# Patient Record
Sex: Male | Born: 1945 | Race: White | Hispanic: No | Smoking: Never smoker
Health system: Southern US, Community
[De-identification: ages and names within clinical notes are randomized; demographics above are authoritative.]

## PROBLEM LIST (undated history)

## (undated) DIAGNOSIS — Z9889 Other specified postprocedural states: Secondary | ICD-10-CM

## (undated) DIAGNOSIS — C61 Malignant neoplasm of prostate: Secondary | ICD-10-CM

## (undated) DIAGNOSIS — C649 Malignant neoplasm of unspecified kidney, except renal pelvis: Secondary | ICD-10-CM

## (undated) DIAGNOSIS — I10 Essential (primary) hypertension: Secondary | ICD-10-CM

## (undated) DIAGNOSIS — D649 Anemia, unspecified: Secondary | ICD-10-CM

## (undated) DIAGNOSIS — R112 Nausea with vomiting, unspecified: Secondary | ICD-10-CM

## (undated) DIAGNOSIS — Z9989 Dependence on other enabling machines and devices: Secondary | ICD-10-CM

## (undated) DIAGNOSIS — J302 Other seasonal allergic rhinitis: Secondary | ICD-10-CM

## (undated) DIAGNOSIS — Z7189 Other specified counseling: Secondary | ICD-10-CM

## (undated) DIAGNOSIS — E785 Hyperlipidemia, unspecified: Secondary | ICD-10-CM

## (undated) DIAGNOSIS — C4490 Unspecified malignant neoplasm of skin, unspecified: Secondary | ICD-10-CM

## (undated) DIAGNOSIS — C801 Malignant (primary) neoplasm, unspecified: Secondary | ICD-10-CM

## (undated) DIAGNOSIS — E119 Type 2 diabetes mellitus without complications: Secondary | ICD-10-CM

## (undated) DIAGNOSIS — T8859XA Other complications of anesthesia, initial encounter: Secondary | ICD-10-CM

## (undated) DIAGNOSIS — G4733 Obstructive sleep apnea (adult) (pediatric): Secondary | ICD-10-CM

## (undated) DIAGNOSIS — N183 Chronic kidney disease, stage 3 unspecified: Secondary | ICD-10-CM

## (undated) HISTORY — PX: VASECTOMY: SHX75

## (undated) HISTORY — DX: Malignant neoplasm of prostate: C61

## (undated) HISTORY — DX: Essential (primary) hypertension: I10

## (undated) HISTORY — DX: Dependence on other enabling machines and devices: Z99.89

## (undated) HISTORY — DX: Other specified counseling: Z71.89

## (undated) HISTORY — PX: REPAIR KNEE LIGAMENT: SUR1188

## (undated) HISTORY — DX: Other seasonal allergic rhinitis: J30.2

## (undated) HISTORY — DX: Malignant (primary) neoplasm, unspecified: C80.1

## (undated) HISTORY — PX: OTHER SURGICAL HISTORY: SHX169

## (undated) HISTORY — PX: HERNIA REPAIR: SHX51

## (undated) HISTORY — DX: Unspecified malignant neoplasm of skin, unspecified: C44.90

## (undated) HISTORY — PX: TONSILLECTOMY: SUR1361

## (undated) HISTORY — DX: Obstructive sleep apnea (adult) (pediatric): G47.33

---

## 2015-02-15 DIAGNOSIS — Z85828 Personal history of other malignant neoplasm of skin: Secondary | ICD-10-CM | POA: Insufficient documentation

## 2015-02-18 DIAGNOSIS — R413 Other amnesia: Secondary | ICD-10-CM | POA: Insufficient documentation

## 2015-04-26 DIAGNOSIS — I10 Essential (primary) hypertension: Secondary | ICD-10-CM | POA: Insufficient documentation

## 2015-11-30 DIAGNOSIS — E782 Mixed hyperlipidemia: Secondary | ICD-10-CM | POA: Insufficient documentation

## 2016-06-12 ENCOUNTER — Other Ambulatory Visit: Payer: Self-pay | Admitting: Neurology

## 2016-06-12 DIAGNOSIS — M5416 Radiculopathy, lumbar region: Secondary | ICD-10-CM | POA: Insufficient documentation

## 2016-06-12 DIAGNOSIS — R202 Paresthesia of skin: Secondary | ICD-10-CM

## 2016-06-12 DIAGNOSIS — R200 Anesthesia of skin: Secondary | ICD-10-CM

## 2016-06-13 ENCOUNTER — Ambulatory Visit
Admission: RE | Admit: 2016-06-13 | Discharge: 2016-06-13 | Disposition: A | Discharge status: Home / Self Care | Payer: Medicare Other | Source: Ambulatory Visit | Attending: Neurology | Admitting: Neurology

## 2016-06-13 DIAGNOSIS — R200 Anesthesia of skin: Secondary | ICD-10-CM

## 2016-06-13 DIAGNOSIS — R202 Paresthesia of skin: Secondary | ICD-10-CM | POA: Diagnosis present

## 2016-08-13 DIAGNOSIS — E538 Deficiency of other specified B group vitamins: Secondary | ICD-10-CM | POA: Insufficient documentation

## 2016-08-20 ENCOUNTER — Other Ambulatory Visit: Payer: Self-pay | Admitting: Neurology

## 2016-08-20 DIAGNOSIS — R202 Paresthesia of skin: Secondary | ICD-10-CM

## 2016-08-20 DIAGNOSIS — R200 Anesthesia of skin: Secondary | ICD-10-CM

## 2016-09-17 DIAGNOSIS — G629 Polyneuropathy, unspecified: Secondary | ICD-10-CM | POA: Insufficient documentation

## 2017-06-19 DIAGNOSIS — Z9079 Acquired absence of other genital organ(s): Secondary | ICD-10-CM | POA: Insufficient documentation

## 2017-11-18 ENCOUNTER — Other Ambulatory Visit: Payer: Self-pay | Admitting: Neurology

## 2017-11-18 DIAGNOSIS — M5416 Radiculopathy, lumbar region: Secondary | ICD-10-CM

## 2017-11-25 ENCOUNTER — Ambulatory Visit
Admission: RE | Admit: 2017-11-25 | Discharge: 2017-11-25 | Disposition: A | Discharge status: Home / Self Care | Payer: Medicare Other | Source: Ambulatory Visit | Attending: Neurology | Admitting: Neurology

## 2017-11-25 DIAGNOSIS — M4726 Other spondylosis with radiculopathy, lumbar region: Secondary | ICD-10-CM | POA: Insufficient documentation

## 2017-11-25 DIAGNOSIS — M5416 Radiculopathy, lumbar region: Secondary | ICD-10-CM

## 2019-10-15 DIAGNOSIS — Z9989 Dependence on other enabling machines and devices: Secondary | ICD-10-CM | POA: Insufficient documentation

## 2019-10-15 DIAGNOSIS — G4733 Obstructive sleep apnea (adult) (pediatric): Secondary | ICD-10-CM | POA: Insufficient documentation

## 2019-11-08 ENCOUNTER — Ambulatory Visit: Payer: Medicare Other | Attending: Internal Medicine

## 2019-11-08 DIAGNOSIS — Z20822 Contact with and (suspected) exposure to covid-19: Secondary | ICD-10-CM

## 2019-11-09 LAB — NOVEL CORONAVIRUS, NAA: SARS-CoV-2, NAA: NOT DETECTED

## 2019-11-10 ENCOUNTER — Telehealth: Payer: Self-pay

## 2019-11-10 NOTE — Telephone Encounter (Signed)
Patient giving negative result and verbalized understanding

## 2020-02-16 DIAGNOSIS — E1169 Type 2 diabetes mellitus with other specified complication: Secondary | ICD-10-CM | POA: Insufficient documentation

## 2020-03-03 DIAGNOSIS — M25551 Pain in right hip: Secondary | ICD-10-CM | POA: Insufficient documentation

## 2020-03-03 DIAGNOSIS — M25552 Pain in left hip: Secondary | ICD-10-CM | POA: Insufficient documentation

## 2020-03-03 DIAGNOSIS — Z9889 Other specified postprocedural states: Secondary | ICD-10-CM | POA: Insufficient documentation

## 2020-06-13 DIAGNOSIS — Z66 Do not resuscitate: Secondary | ICD-10-CM | POA: Insufficient documentation

## 2020-06-13 DIAGNOSIS — Z0000 Encounter for general adult medical examination without abnormal findings: Secondary | ICD-10-CM | POA: Insufficient documentation

## 2020-06-20 ENCOUNTER — Other Ambulatory Visit: Payer: Self-pay

## 2020-06-20 ENCOUNTER — Encounter: Payer: Self-pay | Admitting: Urology

## 2020-06-20 ENCOUNTER — Ambulatory Visit (INDEPENDENT_AMBULATORY_CARE_PROVIDER_SITE_OTHER): Payer: Medicare Other | Admitting: Urology

## 2020-06-20 VITALS — BP 128/82 | HR 71 | Ht 69.0 in | Wt 241.0 lb

## 2020-06-20 DIAGNOSIS — C61 Malignant neoplasm of prostate: Secondary | ICD-10-CM | POA: Diagnosis not present

## 2020-06-20 NOTE — Progress Notes (Signed)
° °  06/20/20 10:20 AM   Peter Stuart 1946-04-25 [id number]  CC: History of prostate cancer  HPI: I saw Peter Stuart today to establish care for history of prostate cancer.  He is a 74 year old male who underwent robotic prostatectomy in [place] in 2009 for prostate cancer.  He is unsure what his Gleason score or risk category was, and those records are unavailable to me.  He did not require any adjuvant radiation, and PSA has remained undetectable since that time.  Most recent PSA was undetectable in February 2020.  He has not had erections since surgery, and his wife passed away last year.  He has had chronic stress incontinence since surgery that is minimally bothersome, and he uses 2-3 pads per day.  Social History:  reports that he has never smoked. He has never used smokeless tobacco. He reports current alcohol use. He reports that he does not use drugs.  Physical Exam: BP 128/82    Pulse 71    Ht 5\' 9"  (1.753 m)    Wt 241 lb (109.3 kg)    BMI 35.59 kg/m    Constitutional:  Alert and oriented, No acute distress. Cardiovascular: No clubbing, cyanosis, or edema. Respiratory: Normal respiratory effort, no increased work of breathing. GI: Abdomen is soft, nontender, nondistended, no abdominal masses   Assessment & Plan:   74 year old male with distant history of prostate cancer of unknown Gleason score treated in 2009 in [place] with robotic prostatectomy, with undetectable PSA since that time.  We discussed that guidelines do not recommend routine PSA surveillance after 10 years, and he is amenable to discontinuing PSA surveillance.  We discussed the extremely low risk of recurrence.  Regarding his incontinence, we discussed different options including Kegel exercises, Cunningham clamp, or artificial urinary sphincter.  He is minimally bothered at this time and is not interested in pursuing other treatment strategies.  Follow-up as needed  I spent 45 total minutes on the day of the  encounter including pre-visit review of the medical record, face-to-face time with the patient, and post visit ordering of labs/imaging/tests.  [name], MD 06/20/2020  [facility] [address], [place], [place] [address] [phone]

## 2020-08-11 ENCOUNTER — Ambulatory Visit: Payer: Medicare Other

## 2020-08-14 ENCOUNTER — Ambulatory Visit (INDEPENDENT_AMBULATORY_CARE_PROVIDER_SITE_OTHER): Payer: Medicare Other | Admitting: Internal Medicine

## 2020-08-14 DIAGNOSIS — G4733 Obstructive sleep apnea (adult) (pediatric): Secondary | ICD-10-CM | POA: Diagnosis not present

## 2020-08-14 DIAGNOSIS — I10 Essential (primary) hypertension: Secondary | ICD-10-CM | POA: Diagnosis not present

## 2020-08-14 DIAGNOSIS — Z6834 Body mass index (BMI) 34.0-34.9, adult: Secondary | ICD-10-CM

## 2020-08-14 DIAGNOSIS — Z7189 Other specified counseling: Secondary | ICD-10-CM

## 2020-08-14 DIAGNOSIS — Z9989 Dependence on other enabling machines and devices: Secondary | ICD-10-CM

## 2020-08-14 NOTE — Progress Notes (Signed)
[facility] [address], [place] [address]  Pulmonary Sleep Medicine   Office Visit Note  Patient Name: Peter Stuart DOB: 1946-06-07 MRN [id number]    Chief Complaint: Obstructive Sleep Apnea visit  Brief History:  Peter Stuart is seen today for initial consultation The patient has a 11 year history of sleep apnea. Patient is using PAP nightly.  The patient feels more rested after sleeping with PAP.  The patient reports benefiting from PAP use. He goes to bed around midnight and wakes 7-7:30 a.m. Reported sleepiness is  improved and the Epworth Sleepiness Score is 9 out of 24. The patient does not take naps but may nod in the afternoons. The patient complains of the following: mask issues and dry mouth.  The compliance download shows excellent  compliance with an average use time of 6.9 hours. The AHI is 2.3  The patient does not complain of limb movements disrupting sleep.   ROS  General: (-) fever, (-) chills, (-) night sweat Nose and Sinuses: (-) nasal stuffiness or itchiness, (-) postnasal drip, (-) nosebleeds, (-) sinus trouble. Mouth and Throat: (-) sore throat, (-) hoarseness. Neck: (-) swollen glands, (-) enlarged thyroid, (-) neck pain. Respiratory: - cough, - shortness of breath, - wheezing. Neurologic: - numbness, - tingling. Psychiatric: - anxiety, - depression   Current Medication: Outpatient Encounter Medications as of 08/14/2020  Medication Sig  . ALLEGRA ALLERGY 60 MG tablet Take 1 tablet by mouth daily as needed.  . Coenzyme Q10 10 MG capsule Take 1 capsule by mouth daily.  Marland Kitchen ezetimibe (ZETIA) 10 MG tablet Take 10 mg by mouth daily.  . fluticasone (FLONASE) 50 MCG/ACT nasal spray Place 2 sprays into the nose daily as needed.  . gabapentin (NEURONTIN) 400 MG capsule Take 400 mg by mouth at bedtime.  . hydrochlorothiazide (HYDRODIURIL) 25 MG tablet Take 25 mg by mouth daily.  Marland Kitchen losartan (COZAAR) 100 MG tablet Take 100 mg by mouth daily.  .  Multiple Vitamin (MULTIVITAMIN) capsule Take 1 capsule by mouth daily.  . Multiple Vitamins-Minerals (OCUVITE EYE HEATLH GUMMIES) CHEW Chew 1 tablet by mouth daily.  . [DISCONTINUED] metFORMIN (GLUCOPHAGE) 500 MG tablet Take 500 mg by mouth daily.   No facility-administered encounter medications on file as of 08/14/2020.    Surgical History: Past Surgical History:  Procedure Laterality Date  . HERNIA REPAIR    . prostatectomy    . REPAIR KNEE LIGAMENT    . TONSILLECTOMY    . VASECTOMY      Medical History: Past Medical History:  Diagnosis Date  . CPAP use counseling   . HTN (hypertension)   . OSA on CPAP   . Seasonal allergies     Family History: Non contributory to the present illness  Social History: Social History   Socioeconomic History  . Marital status: Married    Spouse name: Not on file  . Number of children: Not on file  . Years of education: Not on file  . Highest education level: Not on file  Occupational History  . Not on file  Tobacco Use  . Smoking status: Never Smoker  . Smokeless tobacco: Never Used  Substance and Sexual Activity  . Alcohol use: Yes  . Drug use: Never  . Sexual activity: Not Currently  Other Topics Concern  . Not on file  Social History Narrative  . Not on file   Social Determinants of Health   Financial Resource Strain:   . Difficulty of Paying Living Expenses:  Not on file  Food Insecurity:   . Worried About Charity fundraiser in the Last Year: Not on file  . Ran Out of Food in the Last Year: Not on file  Transportation Needs:   . Lack of Transportation (Medical): Not on file  . Lack of Transportation (Non-Medical): Not on file  Physical Activity:   . Days of Exercise per Week: Not on file  . Minutes of Exercise per Session: Not on file  Stress:   . Feeling of Stress : Not on file  Social Connections:   . Frequency of Communication with Friends and Family: Not on file  . Frequency of Social Gatherings with Friends  and Family: Not on file  . Attends Religious Services: Not on file  . Active Member of Clubs or Organizations: Not on file  . Attends Archivist Meetings: Not on file  . Marital Status: Not on file  Intimate Partner Violence:   . Fear of Current or Ex-Partner: Not on file  . Emotionally Abused: Not on file  . Physically Abused: Not on file  . Sexually Abused: Not on file    Vital Signs: Blood pressure 119/84, pulse 65, height 5\' 10"  (1.778 m), weight 242 lb (109.8 kg), SpO2 94 %.  Examination: General Appearance: The patient is well-developed, well-nourished, and in no distress. Neck Circumference: 46 Skin: Gross inspection of skin unremarkable. Head: normocephalic, no gross deformities. Eyes: no gross deformities noted. ENT: ears appear grossly normal Neurologic: Alert and oriented. No involuntary movements.    EPWORTH SLEEPINESS SCALE:  Scale:  (0)= no chance of dozing; (1)= slight chance of dozing; (2)= moderate chance of dozing; (3)= high chance of dozing  Chance  Situtation    Sitting and reading: 2    Watching TV: 2    Sitting Inactive in public: 1    As a passenger in car: 0      Lying down to rest: 3    Sitting and talking: 0    Sitting quielty after lunch: 1    In a car, stopped in traffic: 0   TOTAL SCORE:   9 out of 24    SLEEP STUDIES:  1. Split 02/09/09 AHI 42 SpO46min 75%, CPAP 10 cm H2O   CPAP COMPLIANCE DATA:  Date Range: 07/12/20-08/10/20  Average Daily Use: 6.9 hours  Median Use: 6.9  Compliance for > 4 Hours: 100% days  AHI: 2.3 respiratory events per hour  Days Used: 30/30  Mask Leak: 48.1  95th Percentile Pressure: 5-15         LABS: No results found for this or any previous visit (from the past 2160 hour(s)).  Radiology: MR LUMBAR SPINE WO CONTRAST  Result Date: 11/25/2017 CLINICAL DATA:  No known injury, numbness and tingling in both feet EXAM: MRI LUMBAR SPINE WITHOUT CONTRAST TECHNIQUE:  Multiplanar, multisequence MR imaging of the lumbar spine was performed. No intravenous contrast was administered. COMPARISON:  None. FINDINGS: Segmentation:  Standard. Alignment:  Physiologic. Vertebrae:  No fracture, evidence of discitis, or bone lesion. Conus medullaris and cauda equina: Conus extends to the T12 level. Conus and cauda equina appear normal. Paraspinal and other soft tissues: No paraspinal abnormality. Disc levels: Disc spaces: Degenerative disease with disc height loss at L5-S1. T12-L1: No significant disc bulge. No evidence of neural foraminal stenosis. No central canal stenosis. L1-L2: No significant disc bulge. No evidence of neural foraminal stenosis. No central canal stenosis. L2-L3: No significant disc bulge. No evidence of neural foraminal  stenosis. No central canal stenosis. L3-L4: Mild broad-based disc bulge. Moderate bilateral facet arthropathy. No evidence of neural foraminal stenosis. No central canal stenosis. L4-L5: Broad-based disc bulge. Moderate bilateral facet arthropathy, right worse than left. No evidence of neural foraminal stenosis. No central canal stenosis. Bilateral lateral recess narrowing. L5-S1: Broad-based disc bulge. No evidence of neural foraminal stenosis. No central canal stenosis. IMPRESSION: 1. Mild lumbar spine spondylosis as described above. Electronically Signed   By: Kathreen Devoid   On: 11/25/2017 15:11          Assessment and Plan: Patient Active Problem List   Diagnosis Date Noted  . Obesity, morbid ([place]) 02/14/2020  . OSA on CPAP 10/15/2019  . Essential hypertension 04/26/2015      The patient does tolerate PAP and reports significant benefit from PAP use. The patient was reminded how to clean the machine and adjust the humidifier setting. He was advised to try xylimelts and or a chin strap for the dry mouth. The patient was also counselled on the importance of weight loss in treating the sleep apnea. He is watching his diet. The  compliance is excellent. The apnea is well controlled.   1. OSA- continue excellent compliance. Ensure adequate sleep time. 2. CPAP couseling-Discussed importance of adequate CPAP use as well as proper care and cleaning techniques of machine and all supplies. 3. HTN:BP well controlled today, continue with current therapy and PCP follow-up as indicated 4. BMI 34: Praised for recent weight loss around 30 pounds, encouraged to continue with healthy lifestyle and keep weight management a priority  General Counseling: I have discussed the findings of the evaluation and examination with Marcello Moores.  I have also discussed any further diagnostic evaluation thatmay be needed or ordered today. Senay verbalizes understanding of the findings of todays visit. We also reviewed his medications today and discussed drug interactions and side effects including but not limited excessive drowsiness and altered mental states. We also discussed that there is always a risk not just to him but also people around him. he has been encouraged to call the office with any questions or concerns that should arise related to todays visit.   I have personally obtained a history, examined the patient, evaluated laboratory and imaging results, formulated the assessment and plan and placed orders.  This patient was seen by Theodoro Grist AGNP-C in Collaboration with Dr. Devona Konig as a part of collaborative care agreement.  Richelle Ito Saunders Glance, PhD, FAASM  Diplomate, American Board of Sleep Medicine    Allyne Gee, MD [facility] Diplomate ABMS Pulmonary and Critical Care Medicine Sleep medicine

## 2020-08-14 NOTE — Patient Instructions (Signed)

## 2020-09-28 ENCOUNTER — Other Ambulatory Visit: Payer: Self-pay | Admitting: Orthopedic Surgery

## 2020-09-28 DIAGNOSIS — M25511 Pain in right shoulder: Secondary | ICD-10-CM

## 2020-09-28 DIAGNOSIS — M25311 Other instability, right shoulder: Secondary | ICD-10-CM

## 2020-09-28 DIAGNOSIS — M19011 Primary osteoarthritis, right shoulder: Secondary | ICD-10-CM

## 2020-10-05 ENCOUNTER — Ambulatory Visit
Admission: RE | Admit: 2020-10-05 | Discharge: 2020-10-05 | Disposition: A | Discharge status: Home / Self Care | Payer: Medicare Other | Source: Ambulatory Visit | Attending: Orthopedic Surgery | Admitting: Orthopedic Surgery

## 2020-10-05 ENCOUNTER — Other Ambulatory Visit: Payer: Self-pay

## 2020-10-05 DIAGNOSIS — M25311 Other instability, right shoulder: Secondary | ICD-10-CM | POA: Insufficient documentation

## 2020-10-05 DIAGNOSIS — M25511 Pain in right shoulder: Secondary | ICD-10-CM | POA: Insufficient documentation

## 2020-10-05 DIAGNOSIS — M19011 Primary osteoarthritis, right shoulder: Secondary | ICD-10-CM | POA: Diagnosis present

## 2020-12-12 ENCOUNTER — Other Ambulatory Visit: Payer: Self-pay | Admitting: Orthopedic Surgery

## 2020-12-25 ENCOUNTER — Encounter: Payer: Self-pay | Admitting: Orthopedic Surgery

## 2020-12-25 ENCOUNTER — Other Ambulatory Visit: Payer: Self-pay

## 2020-12-27 ENCOUNTER — Other Ambulatory Visit: Payer: Self-pay

## 2020-12-27 ENCOUNTER — Other Ambulatory Visit
Admission: RE | Admit: 2020-12-27 | Discharge: 2020-12-27 | Disposition: A | Discharge status: Home / Self Care | Payer: Medicare Other | Source: Ambulatory Visit | Attending: Orthopedic Surgery | Admitting: Orthopedic Surgery

## 2020-12-27 DIAGNOSIS — Z20822 Contact with and (suspected) exposure to covid-19: Secondary | ICD-10-CM | POA: Insufficient documentation

## 2020-12-27 DIAGNOSIS — Z01812 Encounter for preprocedural laboratory examination: Secondary | ICD-10-CM | POA: Insufficient documentation

## 2020-12-27 LAB — SARS CORONAVIRUS 2 (TAT 6-24 HRS): SARS Coronavirus 2: NEGATIVE

## 2020-12-29 ENCOUNTER — Ambulatory Visit: Payer: Medicare Other | Admitting: Anesthesiology

## 2020-12-29 ENCOUNTER — Other Ambulatory Visit: Payer: Self-pay

## 2020-12-29 ENCOUNTER — Ambulatory Visit
Admission: RE | Admit: 2020-12-29 | Discharge: 2020-12-29 | Disposition: A | Discharge status: Home / Self Care | Payer: Medicare Other | Attending: Orthopedic Surgery | Admitting: Orthopedic Surgery

## 2020-12-29 ENCOUNTER — Encounter: Payer: Self-pay | Admitting: Orthopedic Surgery

## 2020-12-29 ENCOUNTER — Encounter
Admission: RE | Disposition: A | Discharge status: Home / Self Care | Payer: Self-pay | Source: Home / Self Care | Attending: Orthopedic Surgery

## 2020-12-29 DIAGNOSIS — M75121 Complete rotator cuff tear or rupture of right shoulder, not specified as traumatic: Secondary | ICD-10-CM | POA: Diagnosis not present

## 2020-12-29 DIAGNOSIS — Z888 Allergy status to other drugs, medicaments and biological substances status: Secondary | ICD-10-CM | POA: Diagnosis not present

## 2020-12-29 DIAGNOSIS — M659 Synovitis and tenosynovitis, unspecified: Secondary | ICD-10-CM | POA: Insufficient documentation

## 2020-12-29 DIAGNOSIS — M25811 Other specified joint disorders, right shoulder: Secondary | ICD-10-CM | POA: Insufficient documentation

## 2020-12-29 DIAGNOSIS — K029 Dental caries, unspecified: Secondary | ICD-10-CM

## 2020-12-29 DIAGNOSIS — Z79899 Other long term (current) drug therapy: Secondary | ICD-10-CM | POA: Insufficient documentation

## 2020-12-29 HISTORY — DX: Other complications of anesthesia, initial encounter: T88.59XA

## 2020-12-29 HISTORY — PX: SHOULDER ARTHROSCOPY WITH SUBACROMIAL DECOMPRESSION AND OPEN ROTATOR C: SHX5688

## 2020-12-29 SURGERY — SHOULDER ARTHROSCOPY WITH SUBACROMIAL DECOMPRESSION AND OPEN ROTATOR CUFF REPAIR, OPEN BICEPS TENDON REPAIR
Anesthesia: Regional | Site: Shoulder | Laterality: Right

## 2020-12-29 MED ORDER — BUPIVACAINE LIPOSOME 1.3 % IJ SUSP: INTRAMUSCULAR | Status: DC | PRN

## 2020-12-29 MED ORDER — OXYCODONE HCL 5 MG PO TABS
5.0000 mg | ORAL_TABLET | ORAL | 0 refills | Status: DC | PRN
Start: 2020-12-29 — End: 2021-04-27

## 2020-12-29 MED ORDER — MIDAZOLAM HCL 5 MG/5ML IJ SOLN
INTRAMUSCULAR | Status: DC | PRN
  Administered 2020-12-29: 1 mg via INTRAVENOUS

## 2020-12-29 MED ORDER — FENTANYL CITRATE (PF) 100 MCG/2ML IJ SOLN
INTRAMUSCULAR | Status: DC | PRN
  Administered 2020-12-29: 50 ug via INTRAVENOUS

## 2020-12-29 MED ORDER — ONDANSETRON 4 MG PO TBDP: 4.0000 mg | ORAL_TABLET | Freq: Three times a day (TID) | ORAL | 0 refills | Status: DC | PRN

## 2020-12-29 MED ORDER — BUPIVACAINE HCL (PF) 0.5 % IJ SOLN: INTRAMUSCULAR | Status: DC | PRN

## 2020-12-29 MED ORDER — OXYCODONE HCL 5 MG PO TABS: 5.0000 mg | ORAL_TABLET | Freq: Once | ORAL | Status: DC | PRN

## 2020-12-29 MED ORDER — BUPIVACAINE HCL (PF) 0.5 % IJ SOLN
INTRAMUSCULAR | Status: DC | PRN
  Administered 2020-12-29: 20 mL via PERINEURAL

## 2020-12-29 MED ORDER — OXYCODONE HCL 5 MG/5ML PO SOLN: 5.0000 mg | Freq: Once | ORAL | Status: DC | PRN

## 2020-12-29 MED ORDER — PROPOFOL 10 MG/ML IV BOLUS
INTRAVENOUS | Status: DC | PRN
  Administered 2020-12-29: 30 mg via INTRAVENOUS
  Administered 2020-12-29: 120 mg via INTRAVENOUS

## 2020-12-29 MED ORDER — LIDOCAINE HCL (CARDIAC) PF 100 MG/5ML IV SOSY
PREFILLED_SYRINGE | INTRAVENOUS | Status: DC | PRN
  Administered 2020-12-29: 80 mg via INTRATRACHEAL

## 2020-12-29 MED ORDER — ASPIRIN EC 325 MG PO TBEC: 325.0000 mg | DELAYED_RELEASE_TABLET | Freq: Every day | ORAL | 0 refills | Status: AC

## 2020-12-29 MED ORDER — LACTATED RINGERS IV SOLN: INTRAVENOUS | Status: DC

## 2020-12-29 MED ORDER — FENTANYL CITRATE (PF) 100 MCG/2ML IJ SOLN
25.0000 ug | INTRAMUSCULAR | Status: DC | PRN
Start: 2020-12-29 — End: 2020-12-29

## 2020-12-29 MED ORDER — GLYCOPYRROLATE 0.2 MG/ML IJ SOLN
INTRAMUSCULAR | Status: DC | PRN
  Administered 2020-12-29: .1 mg via INTRAVENOUS

## 2020-12-29 MED ORDER — ONDANSETRON HCL 4 MG/2ML IJ SOLN
INTRAMUSCULAR | Status: DC | PRN
Start: 2020-12-29 — End: 2020-12-29
  Administered 2020-12-29: 4 mg via INTRAVENOUS

## 2020-12-29 MED ORDER — LACTATED RINGERS IV SOLN
INTRAVENOUS | Status: DC | PRN
  Administered 2020-12-29: 4 mL

## 2020-12-29 MED ORDER — ACETAMINOPHEN 160 MG/5ML PO SOLN: 325.0000 mg | ORAL | Status: DC | PRN

## 2020-12-29 MED ORDER — CEFAZOLIN SODIUM-DEXTROSE 2-4 GM/100ML-% IV SOLN
2.0000 g | INTRAVENOUS | Status: AC
  Administered 2020-12-29: 2 g via INTRAVENOUS

## 2020-12-29 MED ORDER — ACETAMINOPHEN 325 MG PO TABS: 325.0000 mg | ORAL_TABLET | ORAL | Status: DC | PRN

## 2020-12-29 MED ORDER — ACETAMINOPHEN 500 MG PO TABS: 1000.0000 mg | ORAL_TABLET | Freq: Three times a day (TID) | ORAL | 2 refills | Status: DC

## 2020-12-29 MED ORDER — BUPIVACAINE LIPOSOME 1.3 % IJ SUSP
INTRAMUSCULAR | Status: DC | PRN
  Administered 2020-12-29: 20 mL via PERINEURAL

## 2020-12-29 SURGICAL SUPPLY — 48 items
ADAPTER IRRIG TUBE 2 SPIKE SOL (ADAPTER) ×4 IMPLANT
ADPR TBG 2 SPK PMP STRL ASCP (ADAPTER) ×2
ANCH SUT 2 SWLK 19.1 CLS EYLT (Anchor) ×2 IMPLANT
ANCH SUT 2.9 PUSHLOCK ANCH (Orthopedic Implant) ×1 IMPLANT
ANCHOR ICONIX SPEED 2.3 (Anchor) ×4 IMPLANT
ANCHOR SWIVELOCK BIO 4.75X19.1 (Anchor) ×4 IMPLANT
APL PRP STRL LF DISP 70% ISPRP (MISCELLANEOUS) ×1
BUR BR 5.5 12 FLUTE (BURR) ×2 IMPLANT
BUR RADIUS 4.0X18.5 (BURR) ×2 IMPLANT
CANNULA TWIST IN 8.25X7CM (CANNULA) ×2 IMPLANT
CHLORAPREP W/TINT 26 (MISCELLANEOUS) ×2 IMPLANT
COOLER POLAR GLACIER W/PUMP (MISCELLANEOUS) ×2 IMPLANT
COVER LIGHT HANDLE UNIVERSAL (MISCELLANEOUS) ×4 IMPLANT
DRAPE IMP U-DRAPE 54X76 (DRAPES) ×4 IMPLANT
DRAPE INCISE IOBAN 66X45 STRL (DRAPES) ×2 IMPLANT
DRAPE U-SHAPE 48X52 POLY STRL (PACKS) ×2 IMPLANT
DRSG TEGADERM 4X4.75 (GAUZE/BANDAGES/DRESSINGS) ×8 IMPLANT
ELECT REM PT RETURN 9FT ADLT (ELECTROSURGICAL) ×2
ELECTRODE REM PT RTRN 9FT ADLT (ELECTROSURGICAL) ×1 IMPLANT
GAUZE XEROFORM 1X8 LF (GAUZE/BANDAGES/DRESSINGS) ×2 IMPLANT
GLOVE SURG ENC MOIS LTX SZ7.5 (GLOVE) ×4 IMPLANT
GOWN STRL REIN 2XL XLG LVL4 (GOWN DISPOSABLE) ×2 IMPLANT
GOWN STRL REUS W/ TWL LRG LVL3 (GOWN DISPOSABLE) ×2 IMPLANT
GOWN STRL REUS W/TWL LRG LVL3 (GOWN DISPOSABLE) ×4
IV LACTATED RINGER IRRG 3000ML (IV SOLUTION) ×18
IV LR IRRIG 3000ML ARTHROMATIC (IV SOLUTION) ×9 IMPLANT
KIT STABILIZATION SHOULDER (MISCELLANEOUS) ×2 IMPLANT
KIT TURNOVER KIT A (KITS) ×2 IMPLANT
MANIFOLD NEPTUNE II (INSTRUMENTS) ×2 IMPLANT
MASK FACE SPIDER DISP (MASK) ×2 IMPLANT
MAT GRAY ABSORB FLUID 28X50 (MISCELLANEOUS) ×4 IMPLANT
NDL SAFETY ECLIPSE 18X1.5 (NEEDLE) ×1 IMPLANT
NEEDLE HYPO 18GX1.5 SHARP (NEEDLE) ×2
PACK ARTHROSCOPY SHOULDER (MISCELLANEOUS) ×2 IMPLANT
PAD WRAPON POLAR SHDR XLG (MISCELLANEOUS) ×1 IMPLANT
PASSER SUT FIRSTPASS SELF (INSTRUMENTS) ×2 IMPLANT
SET TUBE SUCT SHAVER OUTFL 24K (TUBING) ×2 IMPLANT
SLING ULTRA II LG (MISCELLANEOUS) ×2 IMPLANT
SLING ULTRA II M (MISCELLANEOUS) ×2 IMPLANT
SPONGE GAUZE 2X2 8PLY STRL LF (GAUZE/BANDAGES/DRESSINGS) ×8 IMPLANT
SUT ETHILON 3-0 FS-10 30 BLK (SUTURE) ×2
SUTURE EHLN 3-0 FS-10 30 BLK (SUTURE) ×1 IMPLANT
SYR 10ML LL (SYRINGE) ×2 IMPLANT
SYSTEM IMPL TENODESIS LNT 2.9 (Orthopedic Implant) ×2 IMPLANT
TAPE MICROFOAM 4IN (TAPE) ×2 IMPLANT
TUBING ARTHRO INFLOW-ONLY STRL (TUBING) ×2 IMPLANT
WAND WEREWOLF FLOW 90D (MISCELLANEOUS) ×2 IMPLANT
WRAPON POLAR PAD SHDR XLG (MISCELLANEOUS) ×2

## 2020-12-29 NOTE — Transfer of Care (Signed)
Immediate Anesthesia Transfer of Care Note  Patient: Peter Stuart  Procedure(s) Performed: Right shoulder arthroscopic rotator cuff repair, subacromial decompression, and biceps tenodesis (Right Shoulder)  Patient Location: PACU  Anesthesia Type: General, Regional  Level of Consciousness: awake, alert  and patient cooperative  Airway and Oxygen Therapy: Patient Spontanous Breathing and Patient connected to supplemental oxygen  Post-op Assessment: Post-op Vital signs reviewed, Patient's Cardiovascular Status Stable, Respiratory Function Stable, Patent Airway and No signs of Nausea or vomiting  Post-op Vital Signs: Reviewed and stable  Complications: No complications documented.

## 2020-12-29 NOTE — Anesthesia Procedure Notes (Addendum)
Anesthesia Regional Block: Interscalene brachial plexus block   Pre-Anesthetic Checklist: ,, timeout performed, Correct Patient, Correct Site, Correct Laterality, Correct Procedure, Correct Position, site marked, Risks and benefits discussed,  Surgical consent,  Pre-op evaluation,  At surgeon's request and post-op pain management  Laterality: Right  Prep: chloraprep       Needles:  Injection technique: Single-shot  Needle Type: Stimiplex     Needle Length: 10cm  Needle Gauge: 21     Additional Needles:   Procedures:,,,, ultrasound used (permanent image in chart),,,,  Narrative:  Start time: 12/29/2020 9:12 AM End time: 12/29/2020 9:19 AM Injection made incrementally with aspirations every 5 mL.  Performed by: Personally   Additional Notes: Functioning IV was confirmed and monitors applied. Ultrasound guidance: relevant anatomy identified, needle position confirmed, local anesthetic spread visualized around nerve(s)., vascular puncture avoided.  Image printed for medical record.  Negative aspiration and no paresthesias; incremental administration of local anesthetic. The patient tolerated the procedure well. Vitals signes recorded in RN notes.

## 2020-12-29 NOTE — Anesthesia Postprocedure Evaluation (Signed)
Anesthesia Post Note  Patient: Peter Stuart  Procedure(s) Performed: Right shoulder arthroscopic rotator cuff repair, subacromial decompression, and biceps tenodesis (Right Shoulder)     Patient location during evaluation: PACU Anesthesia Type: Regional and General Level of consciousness: awake Pain management: pain level controlled Vital Signs Assessment: post-procedure vital signs reviewed and stable Respiratory status: respiratory function stable Cardiovascular status: stable Postop Assessment: no signs of nausea or vomiting Anesthetic complications: no   No complications documented.  Veda Canning

## 2020-12-29 NOTE — Anesthesia Procedure Notes (Signed)
Procedure Name: LMA Insertion Date/Time: 12/29/2020 10:40 AM Performed by: Silvana Newness, CRNA Pre-anesthesia Checklist: Patient identified, Emergency Drugs available, Suction available, Patient being monitored and Timeout performed Patient Re-evaluated:Patient Re-evaluated prior to induction Oxygen Delivery Method: Circle system utilized Preoxygenation: Pre-oxygenation with 100% oxygen Induction Type: IV induction Ventilation: Mask ventilation without difficulty LMA: LMA inserted LMA Size: 4.0 Tube type: Oral Number of attempts: 1 Placement Confirmation: positive ETCO2 and breath sounds checked- equal and bilateral Tube secured with: Tape Dental Injury: Teeth and Oropharynx as per pre-operative assessment

## 2020-12-29 NOTE — Discharge Instructions (Signed)
Post-Op Instructions - Rotator Cuff Repair  1. Bracing: You will wear a shoulder immobilizer or sling for 6 weeks.   2. Driving: No driving for 3 weeks post-op. When driving, do not wear the immobilizer. Ideally, we recommend no driving for 6 weeks while sling is in place as one arm will be immobilized.   3. Activity: No active lifting for 2 months. Wrist, hand, and elbow motion only. Avoid lifting the upper arm away from the body except for hygiene. You are permitted to bend and straighten the elbow passively only (no active elbow motion). You may use your hand and wrist for typing, writing, and managing utensils (cutting food). Do not lift more than a coffee cup for 8 weeks.  When sleeping or resting, inclined positions (recliner chair or wedge pillow) and a pillow under the forearm for support may provide better comfort for up to 4 weeks.  Avoid long distance travel for 4 weeks.  Return to normal activities after rotator cuff repair repair normally takes 6 months on average. If rehab goes very well, may be able to do most activities at 4 months, except overhead or contact sports.  4. Physical Therapy: Begins 3-4 days after surgery, and proceed 1 time per week for the first 6 weeks, then 1-2 times per week from weeks 6-20 post-op.  5. Medications:  - You will be provided a prescription for narcotic pain medicine. After surgery, take 1-2 narcotic tablets every 4 hours if needed for severe pain.  - A prescription for anti-nausea medication will be provided in case the narcotic medicine causes nausea - take 1 tablet every 6 hours only if nauseated.   - Take tylenol 1000 mg (2 Extra Strength tablets or 3 regular strength) every 8 hours for pain.  May decrease or stop tylenol 5 days after surgery if you are having minimal pain. - Take ASA 325mg/day x 2 weeks to help prevent DVTs/PEs (blood clots).  - DO NOT take ANY nonsteroidal anti-inflammatory pain medications (Advil, Motrin, Ibuprofen, Aleve,  Naproxen, or Naprosyn). These medicines can inhibit healing of your shoulder repair.    If you are taking prescription medication for anxiety, depression, insomnia, muscle spasm, chronic pain, or for attention deficit disorder, you are advised that you are at a higher risk of adverse effects with use of narcotics post-op, including narcotic addiction/dependence, depressed breathing, death. If you use non-prescribed substances: alcohol, marijuana, cocaine, heroin, methamphetamines, etc., you are at a higher risk of adverse effects with use of narcotics post-op, including narcotic addiction/dependence, depressed breathing, death. You are advised that taking > 50 morphine milligram equivalents (MME) of narcotic pain medication per day results in twice the risk of overdose or death. For your prescription provided: oxycodone 5 mg - taking more than 6 tablets per day would result in > 50 morphine milligram equivalents (MME) of narcotic pain medication. Be advised that we will prescribe narcotics short-term, for acute post-operative pain only - 3 weeks for major operations such as shoulder repair/reconstruction surgeries.     6. Post-Op Appointment:  Your first post-op appointment will be 10-14 days post-op.  7. Work or School: For most, but not all procedures, we advise staying out of work or school for at least 1 to 2 weeks in order to recover from the stress of surgery and to allow time for healing.   If you need a work or school note this can be provided.   8. Smoking: If you are a smoker, you need to refrain from   smoking in the postoperative period. The nicotine in cigarettes will inhibit healing of your shoulder repair and decrease the chance of successful repair. Similarly, nicotine containing products (gum, patches) should be avoided.   Post-operative Brace: Apply and remove the brace you received as you were instructed to at the time of fitting and as described in detail as the brace's  instructions for use indicate.  Wear the brace for the period of time prescribed by your physician.  The brace can be cleaned with soap and water and allowed to air dry only.  Should the brace result in increased pain, decreased feeling (numbness/tingling), increased swelling or an overall worsening of your medical condition, please contact your doctor immediately.  If an emergency situation occurs as a result of wearing the brace after normal business hours, please dial 911 and seek immediate medical attention.  Let your doctor know if you have any further questions about the brace issued to you. Refer to the shoulder sling instructions for use if you have any questions regarding the correct fit of your shoulder sling.  [employer] for Troubleshooting: [phone]  Video that illustrates how to properly use a shoulder sling: "Instructions for Proper Use of an Orthopaedic Sling" [email or web address]         General Anesthesia, Adult, Care After This sheet gives you information about how to care for yourself after your procedure. Your health care provider may also give you more specific instructions. If you have problems or questions, contact your health care provider. What can I expect after the procedure? After the procedure, the following side effects are common:  Pain or discomfort at the IV site.  Nausea.  Vomiting.  Sore throat.  Trouble concentrating.  Feeling cold or chills.  Feeling weak or tired.  Sleepiness and fatigue.  Soreness and body aches. These side effects can affect parts of the body that were not involved in surgery. Follow these instructions at home: For the time period you were told by your health care provider:  Rest.  Do not participate in activities where you could fall or become injured.  Do not drive or use machinery.  Do not drink alcohol.  Do not take sleeping pills or medicines that cause drowsiness.  Do  not make important decisions or sign legal documents.  Do not take care of children on your own.   Eating and drinking  Follow any instructions from your health care provider about eating or drinking restrictions.  When you feel hungry, start by eating small amounts of foods that are soft and easy to digest (bland), such as toast. Gradually return to your regular diet.  Drink enough fluid to keep your urine pale yellow.  If you vomit, rehydrate by drinking water, juice, or clear broth. General instructions  If you have sleep apnea, surgery and certain medicines can increase your risk for breathing problems. Follow instructions from your health care provider about wearing your sleep device: ? Anytime you are sleeping, including during daytime naps. ? While taking prescription pain medicines, sleeping medicines, or medicines that make you drowsy.  Have a responsible adult stay with you for the time you are told. It is important to have someone help care for you until you are awake and alert.  Return to your normal activities as told by your health care provider. Ask your health care provider what activities are safe for you.  Take over-the-counter and prescription medicines only as told by your health care provider.  If you smoke,  do not smoke without supervision.  Keep all follow-up visits as told by your health care provider. This is important. Contact a health care provider if:  You have nausea or vomiting that does not get better with medicine.  You cannot eat or drink without vomiting.  You have pain that does not get better with medicine.  You are unable to pass urine.  You develop a skin rash.  You have a fever.  You have redness around your IV site that gets worse. Get help right away if:  You have difficulty breathing.  You have chest pain.  You have blood in your urine or stool, or you vomit blood. Summary  After the procedure, it is common to have a sore  throat or nausea. It is also common to feel tired.  Have a responsible adult stay with you for the time you are told. It is important to have someone help care for you until you are awake and alert.  When you feel hungry, start by eating small amounts of foods that are soft and easy to digest (bland), such as toast. Gradually return to your regular diet.  Drink enough fluid to keep your urine pale yellow.  Return to your normal activities as told by your health care provider. Ask your health care provider what activities are safe for you. This information is not intended to replace advice given to you by your health care provider. Make sure you discuss any questions you have with your health care provider. Document Revised: 06/29/2020 Document Reviewed: 01/27/2020 Elsevier Patient Education  2021 [employer].

## 2020-12-29 NOTE — Op Note (Signed)
SURGERY DATE: 12/29/2020   PRE-OP DIAGNOSIS:  1. Right subacromial impingement 2. Right biceps tendinopathy 3. Right rotator cuff tear   POST-OP DIAGNOSIS: 1. Right subacromial impingement 2. Right biceps tendinopathy 3. Right rotator cuff tear   PROCEDURES:  1. Right arthroscopic rotator cuff repair 2. Right arthroscopic biceps tenodesis 3. Right arthroscopic subacromial decompression 4. Right arthroscopic extensive debridement of shoulder (glenohumeral and subacromial spaces)   SURGEON: Cato Mulligan, MD   ASSISTANT: Anitra Lauth, PA   ANESTHESIA: Gen with Exparil interscalene block   ESTIMATED BLOOD LOSS: 5cc   DRAINS:  none   TOTAL IV FLUIDS: per anesthesia      SPECIMENS: none   IMPLANTS:  - Arthrex 2.6mm PushLock x 1 - Arthrex 4.40mm SwiveLock x 2 - Iconix SPEED double loaded with 1.2 and 2.43mm tape x 2     OPERATIVE FINDINGS:  Examination under anesthesia: A careful examination under anesthesia was performed.  Passive range of motion was: FF: 150; ER at side: 45; ER in abduction: 95; IR in abduction: 45.  Anterior load shift: NT.  Posterior load shift: NT.  Sulcus in neutral: NT.  Sulcus in ER: NT.     Intra-operative findings: A thorough arthroscopic examination of the shoulder was performed.  The findings are: 1. Biceps tendon: tendinopathy with significant erythema  2. Superior labrum: erythema 3. Posterior labrum and capsule: normal 4. Inferior capsule and inferior recess: normal 5. Glenoid cartilage surface: Normal 6. Supraspinatus attachment: full-thickness tear of the supraspinatus 7. Posterior rotator cuff attachment: normal 8. Humeral head articular cartilage: normal 9. Rotator interval: significant synovitis 10: Subscapularis tendon: attachment intact 11. Anterior labrum: Mildly degenerative 12. IGHL: normal   OPERATIVE REPORT:    Indications for procedure:  Peter Stuart is a 75 y.o. male with approximately 1 yearof right shoulder pain that  may have started after helping lift up his neighbor.  He has had difficulty with overhead motion since that time with sensations of weakness. He has not responded appropriately to non-operative management consisting of medical management, activity modifications, exercises, and corticosteroid injections. Clinical exam and MRI were suggestive of rotator cuff tear, biceps tendinopathy, and subacromial impingement. After discussion of risks, benefits, and alternatives to surgery, the patient elected to proceed.    Procedure in detail:   I identified Peter Stuart in the pre-operative holding area.  I marked the operative shoulder with my initials. I reviewed the risks and benefits of the proposed surgical intervention, and the patient wished to proceed.  Anesthesia was then performed with an Exparil interscalene block.  The patient was transferred to the operative suite and placed in the beach chair position.     Appropriate IV antibiotics were administered prior to incision. The operative upper extremity was then prepped and draped in standard fashion. A time out was performed confirming the correct extremity, correct patient, and correct procedure.    I then created a standard posterior portal with an 11 blade. The glenohumeral joint was easily entered with a blunt trocar and the arthroscope introduced. The findings of diagnostic arthroscopy are described above. I debrided degenerative tissue including the synovitic tissue about the rotator interval and anterior and superior labrum. I then coagulated the inflamed synovium to obtain hemostasis and reduce the risk of post-operative swelling using an Arthrocare radiofrequency device.   I then turned my attention to the arthroscopic biceps tenodesis.  I used the Loop n Tack technique to pass a FiberTape through the biceps in a locked fashion adjacent to  the biceps anchor.  A hole for a 2.9 mm Arthrex PushLock was drilled in the bicipital groove just superior to  the subscapularis tendon insertion.  The biceps tendon was then cut.  The FiberTape was loaded onto the PushLock anchor and impacted into place into the previously drilled hole in the bicipital groove.  This appropriately secured the biceps into the bicipital groove and took it off of tension.   Next, the arthroscope was then introduced into the subacromial space. A direct lateral portal was created with an 11-blade after spinal needle localization. An extensive subacromial bursectomy was performed using a combination of the shaver and Arthrocare wand. The entire acromial undersurface was exposed and the CA ligament was subperiosteally elevated to expose the anterior acromial hook. A burr was used to create a flat anterior and lateral aspect of the acromion, converting it from a Type 2 to a Type 1 acromion. Care was made to keep the deltoid fascia intact.   Next I created an accessory posterolateral portal to assist with visualization and instrumentation.  I debrided the poor quality edges of the supraspinatus tendon.  This was a U-shaped tear of the supraspinatus.  I prepared the footprint using a burr to expose bleeding bone.    I then percutaneously placed 1 Iconix SPEED medial row anchor along the anterior portion of the tear at the articular margin. Another SPEED anchor was placed along the posterior portion of the tear at the articular margin. I then shuttled all 8 strands of tape through the rotator cuff just lateral to the musculotendinous junction using a FirstPass suture passer spanning the anterior to posterior extent of the tear. The posterior strands of each suture were passed through an [employer] anchor.  This was placed approximately 2 cm distal to the lateral edge of the footprint in line with the posterior aspect of the tear with appropriate tensioning of each suture prior to final fixation.  Similarly, the anterior strands of each suture were passed through another SwiveLock anchor  along the anterior margin of the tear.  There was a small dogear posteriorly.  The knotless mechanism was utilized to pass the FiberWire sutures from the posterior lateral row anchor to reduce the dogear.  This construct allowed for excellent reapproximation of the rotator cuff to its native footprint without undue tension.  Appropriate compression was achieved.  The repair was stable to external and internal rotation.   Fluid was evacuated from the shoulder, and the portals were closed with 3-0 Nylon. Xeroform was applied to the portals. A sterile dressing was applied, followed by a Polar Care sleeve and a SlingShot shoulder immobilizer/sling. The patient was awakened from anesthesia without difficulty and was transferred to the PACU in stable condition.    Of note, assistance from a PA was essential to performing the surgery.  PA was present for the entire surgery.  PA assisted with patient positioning, retraction, instrumentation, and wound closure. The surgery would have been more difficult and had longer operative time without PA assistance.   COMPLICATIONS: none   DISPOSITION: plan for discharge home after recovery in PACU     POSTOPERATIVE PLAN: Remain in sling (except hygiene and elbow/wrist/hand RoM exercises as instructed by PT) x 6 weeks and NWB for this time. PT to begin 3-4 days after surgery.  Large rotator cuff repair rehab protocol. ASA 325mg  daily x 2 weeks for DVT ppx.

## 2020-12-29 NOTE — Anesthesia Preprocedure Evaluation (Signed)
Anesthesia Evaluation  Patient identified by MRN, date of birth, ID band Patient awake    Reviewed: Allergy & Precautions, NPO status , Patient's Chart, lab work & pertinent test results  Airway Mallampati: II  TM Distance: >3 FB     Dental   Pulmonary sleep apnea and Continuous Positive Airway Pressure Ventilation ,    breath sounds clear to auscultation       Cardiovascular hypertension,  Rhythm:Regular Rate:Normal     Neuro/Psych    GI/Hepatic   Endo/Other  Obesity - BMI 34  Renal/GU      Musculoskeletal   Abdominal   Peds  Hematology   Anesthesia Other Findings   Reproductive/Obstetrics                             Anesthesia Physical Anesthesia Plan  ASA: III  Anesthesia Plan: General and Regional   Post-op Pain Management:  Regional for Post-op pain   Induction: Intravenous  PONV Risk Score and Plan: Treatment may vary due to age or medical condition  Airway Management Planned: LMA  Additional Equipment:   Intra-op Plan:   Post-operative Plan:   Informed Consent: I have reviewed the patients History and Physical, chart, labs and discussed the procedure including the risks, benefits and alternatives for the proposed anesthesia with the patient or authorized representative who has indicated his/her understanding and acceptance.     Dental advisory given  Plan Discussed with: CRNA  Anesthesia Plan Comments:         Anesthesia Quick Evaluation

## 2020-12-29 NOTE — Progress Notes (Signed)
Assisted Peter Stuart, ANMD  with right, ultrasound guided, interscalene  block. Side rails up, monitors on throughout procedure. See vital signs in flow sheet. Tolerated Procedure well.  

## 2020-12-29 NOTE — H&P (Signed)
Paper H&P to be scanned into permanent record. H&P reviewed. No significant changes noted.  

## 2021-01-01 ENCOUNTER — Encounter: Payer: Self-pay | Admitting: Orthopedic Surgery

## 2021-03-05 ENCOUNTER — Other Ambulatory Visit: Payer: Self-pay

## 2021-03-05 ENCOUNTER — Encounter (INDEPENDENT_AMBULATORY_CARE_PROVIDER_SITE_OTHER): Payer: Self-pay | Admitting: Vascular Surgery

## 2021-03-05 ENCOUNTER — Ambulatory Visit (INDEPENDENT_AMBULATORY_CARE_PROVIDER_SITE_OTHER): Payer: Medicare Other | Admitting: Vascular Surgery

## 2021-03-05 VITALS — BP 117/78 | HR 63 | Resp 16 | Ht 69.0 in | Wt 245.8 lb

## 2021-03-05 DIAGNOSIS — M7989 Other specified soft tissue disorders: Secondary | ICD-10-CM | POA: Diagnosis not present

## 2021-03-05 DIAGNOSIS — E1169 Type 2 diabetes mellitus with other specified complication: Secondary | ICD-10-CM | POA: Diagnosis not present

## 2021-03-05 DIAGNOSIS — M79661 Pain in right lower leg: Secondary | ICD-10-CM

## 2021-03-05 DIAGNOSIS — E782 Mixed hyperlipidemia: Secondary | ICD-10-CM | POA: Diagnosis not present

## 2021-03-05 DIAGNOSIS — C61 Malignant neoplasm of prostate: Secondary | ICD-10-CM | POA: Insufficient documentation

## 2021-03-05 DIAGNOSIS — E785 Hyperlipidemia, unspecified: Secondary | ICD-10-CM

## 2021-03-05 DIAGNOSIS — I10 Essential (primary) hypertension: Secondary | ICD-10-CM | POA: Diagnosis not present

## 2021-03-05 NOTE — Progress Notes (Signed)
MRN : [id number]  Peter Stuart is a 75 y.o. (07/11/1946) male who presents with chief complaint of No chief complaint on file. Marland Kitchen  History of Present Illness:   Patient is seen for evaluation of right leg pain and right ankle swelling. The patient first noticed the swelling remotely but it seemed worse after his rotator cuff surgery. The swelling is associated with pain and discoloration. The pain and swelling worsens with prolonged dependency and improves with elevation.   The patient denies claudication symptoms.  The patient denies symptoms consistent with rest pain.  The patient denies and extensive history of DJD and LS spine disease.  The patient has no had any past angiography, interventions or vascular surgery.  Elevation makes the leg symptoms better, dependency makes them much worse. There is no history of ulcerations. The patient denies any recent changes in medications.  The patient has not been wearing graduated compression.  The patient denies a history of DVT or PE. There is no prior history of phlebitis. There is no history of primary lymphedema.  No history of malignancies. No history of trauma or groin or pelvic surgery. There is no history of radiation treatment to the groin or pelvis  The patient denies amaurosis fugax or recent TIA symptoms. There are no recent neurological changes noted. The patient denies recent episodes of angina or shortness of breath  Past Medical History:  Diagnosis Date  . Complication of anesthesia    slow to wake after 1 surgery  . CPAP use counseling   . HTN (hypertension)   . OSA on CPAP   . Seasonal allergies     Past Surgical History:  Procedure Laterality Date  . HERNIA REPAIR    . prostatectomy    . REPAIR KNEE LIGAMENT    . SHOULDER ARTHROSCOPY WITH SUBACROMIAL DECOMPRESSION AND OPEN ROTATOR C Right 12/29/2020   Procedure: Right shoulder arthroscopic rotator cuff repair, subacromial decompression, and biceps tenodesis;   Surgeon: Leim Fabry, MD;  Location: [facility];  Service: Orthopedics;  Laterality: Right;  . TONSILLECTOMY    . VASECTOMY      Social History Social History   Tobacco Use  . Smoking status: Never Smoker  . Smokeless tobacco: Never Used  Vaping Use  . Vaping Use: Never used  Substance Use Topics  . Alcohol use: Not Currently  . Drug use: Never    Family History No family history of bleeding/clotting disorders, porphyria or autoimmune disease   Allergies  Allergen Reactions  . Other Other (See Comments)  . Statins     Muscle ache     REVIEW OF SYSTEMS (Negative unless checked)  Constitutional: [] Weight loss  [] Fever  [] Chills Cardiac: [] Chest pain   [] Chest pressure   [] Palpitations   [] Shortness of breath when laying flat   [] Shortness of breath with exertion. Vascular:  [] Pain in legs with walking   [x] Pain in legs at rest  [] History of DVT   [] Phlebitis   [x] Swelling in legs   [] Varicose veins   [] Non-healing ulcers Pulmonary:   [] Uses home oxygen   [] Productive cough   [] Hemoptysis   [] Wheeze  [] COPD   [] Asthma Neurologic:  [] Dizziness   [] Seizures   [] History of stroke   [] History of TIA  [] Aphasia   [] Vissual changes   [] Weakness or numbness in arm   [x] Weakness or numbness in leg Musculoskeletal:   [] Joint swelling   [x] Joint pain   [] Low back pain Hematologic:  [] Easy bruising  [] Easy bleeding   []   Hypercoagulable state   [] Anemic Gastrointestinal:  [] Diarrhea   [] Vomiting  [] Gastroesophageal reflux/heartburn   [] Difficulty swallowing. Genitourinary:  [] Chronic kidney disease   [] Difficult urination  [] Frequent urination   [] Blood in urine Skin:  [] Rashes   [] Ulcers  Psychological:  [] History of anxiety   []  History of major depression.  Physical Examination  There were no vitals filed for this visit. There is no height or weight on file to calculate BMI. Gen: WD/WN, NAD Head: /AT, No temporalis wasting.  Ear/Nose/Throat: Hearing grossly intact,  nares w/o erythema or drainage, poor dentition Eyes: PER, EOMI, sclera nonicteric.  Neck: Supple, no masses.  No bruit or JVD.  Pulmonary:  Good air movement, clear to auscultation bilaterally, no use of accessory muscles.  Cardiac: RRR, normal S1, S2, no Murmurs. Vascular: scattered varicosities present bilaterally.  Moderate venous stasis changes to the legs bilaterally.  2+ soft pitting edema right > left Vessel Right Left  Radial Palpable Palpable  PT Palpable Palpable  DP Palpable Palpable  Gastrointestinal: soft, non-distended. No guarding/no peritoneal signs.  Musculoskeletal: M/S 5/5 throughout.  No deformity or atrophy.  Neurologic: CN 2-12 intact. Pain and light touch intact in extremities.  Symmetrical.  Speech is fluent. Motor exam as listed above. Psychiatric: Judgment intact, Mood & affect appropriate for pt's clinical situation. Dermatologic: Venous rashes no ulcers noted.  No changes consistent with cellulitis. Lymph : + lichenification / skin changes of chronic lymphedema.  CBC No results found for: WBC, HGB, HCT, MCV, PLT  BMET No results found for: NA, K, CL, CO2, GLUCOSE, BUN, CREATININE, CALCIUM, GFRNONAA, GFRAA CrCl cannot be calculated (No successful lab value found.).  COAG No results found for: INR, PROTIME  Radiology No results found.   Assessment/Plan 1. Pain and swelling of lower leg, right No surgery or intervention at this point in time.  I have reviewed my discussion with the patient regarding venous insufficiency and why it causes symptoms. I have discussed with the patient the chronic skin changes that accompany venous insufficiency and the long term sequela such as ulceration. Patient will contnue wearing graduated compression stockings on a daily basis, as this has provided excellent control of his edema. The patient will put the stockings on first thing in the morning and removing them in the evening. The patient is reminded not to sleep in the  stockings.  In addition, behavioral modification including elevation during the day will be initiated. Exercise is strongly encouraged.  Given the patient's good control and lack of any problems regarding the venous insufficiency and lymphedema a lymph pump in not need at this time.  The patient will follow up with me PRN should anything change.  The patient voices agreement with this plan.   2. Essential hypertension Continue antihypertensive medications as already ordered, these medications have been reviewed and there are no changes at this time.   3. Type 2 diabetes mellitus with hyperlipidemia ([place]) Continue hypoglycemic medications as already ordered, these medications have been reviewed and there are no changes at this time.  Hgb A1C to be monitored as already arranged by primary service   4. Mixed hyperlipidemia Continue statin as ordered and reviewed, no changes at this time     Hortencia Pilar, MD  03/05/2021 9:01 AM

## 2021-04-27 ENCOUNTER — Observation Stay: Payer: Medicare Other | Admitting: Anesthesiology

## 2021-04-27 ENCOUNTER — Observation Stay
Admission: EM | Admit: 2021-04-27 | Discharge: 2021-04-27 | Disposition: A | Discharge status: Home / Self Care | Payer: Medicare Other | Attending: General Surgery | Admitting: General Surgery

## 2021-04-27 ENCOUNTER — Encounter
Admission: EM | Disposition: A | Discharge status: Home / Self Care | Payer: Self-pay | Source: Home / Self Care | Attending: Student in an Organized Health Care Education/Training Program

## 2021-04-27 ENCOUNTER — Other Ambulatory Visit: Payer: Self-pay

## 2021-04-27 ENCOUNTER — Emergency Department: Payer: Medicare Other

## 2021-04-27 DIAGNOSIS — K3580 Unspecified acute appendicitis: Secondary | ICD-10-CM | POA: Diagnosis present

## 2021-04-27 DIAGNOSIS — E119 Type 2 diabetes mellitus without complications: Secondary | ICD-10-CM | POA: Insufficient documentation

## 2021-04-27 DIAGNOSIS — K3589 Other acute appendicitis without perforation or gangrene: Principal | ICD-10-CM | POA: Insufficient documentation

## 2021-04-27 DIAGNOSIS — Z8546 Personal history of malignant neoplasm of prostate: Secondary | ICD-10-CM | POA: Diagnosis not present

## 2021-04-27 DIAGNOSIS — R9341 Abnormal radiologic findings on diagnostic imaging of renal pelvis, ureter, or bladder: Secondary | ICD-10-CM

## 2021-04-27 DIAGNOSIS — R1031 Right lower quadrant pain: Secondary | ICD-10-CM | POA: Diagnosis present

## 2021-04-27 DIAGNOSIS — Z79899 Other long term (current) drug therapy: Secondary | ICD-10-CM | POA: Insufficient documentation

## 2021-04-27 DIAGNOSIS — Z20822 Contact with and (suspected) exposure to covid-19: Secondary | ICD-10-CM | POA: Insufficient documentation

## 2021-04-27 DIAGNOSIS — I10 Essential (primary) hypertension: Secondary | ICD-10-CM | POA: Diagnosis not present

## 2021-04-27 HISTORY — PX: LAPAROSCOPIC APPENDECTOMY: SHX408

## 2021-04-27 LAB — COMPREHENSIVE METABOLIC PANEL
ALT: 16 U/L (ref 0–44)
AST: 17 U/L (ref 15–41)
Albumin: 4 g/dL (ref 3.5–5.0)
Alkaline Phosphatase: 49 U/L (ref 38–126)
Anion gap: 7 (ref 5–15)
BUN: 14 mg/dL (ref 8–23)
CO2: 29 mmol/L (ref 22–32)
Calcium: 9.1 mg/dL (ref 8.9–10.3)
Chloride: 99 mmol/L (ref 98–111)
Creatinine, Ser: 1.01 mg/dL (ref 0.61–1.24)
GFR, Estimated: >60 mL/min (ref >60)
Glucose, Bld: 169 mg/dL — ABNORMAL HIGH (ref 70–99)
Potassium: 3.5 mmol/L (ref 3.5–5.1)
Sodium: 135 mmol/L (ref 135–145)
Total Bilirubin: 0.9 mg/dL (ref 0.3–1.2)
Total Protein: 6.6 g/dL (ref 6.5–8.1)

## 2021-04-27 LAB — URINALYSIS, COMPLETE (UACMP) WITH MICROSCOPIC
Bacteria, UA: NONE SEEN
Bilirubin Urine: NEGATIVE
Glucose, UA: NEGATIVE mg/dL
Hgb urine dipstick: NEGATIVE
Ketones, ur: NEGATIVE mg/dL
Leukocytes,Ua: NEGATIVE
Nitrite: NEGATIVE
Protein, ur: NEGATIVE mg/dL
Specific Gravity, Urine: 1.012 (ref 1.005–1.030)
WBC, UA: NONE SEEN WBC/hpf (ref 0–5)
pH: 5 (ref 5.0–8.0)

## 2021-04-27 LAB — RESP PANEL BY RT-PCR (FLU A&B, COVID) ARPGX2
Influenza A by PCR: NEGATIVE
Influenza B by PCR: NEGATIVE
SARS Coronavirus 2 by RT PCR: NEGATIVE

## 2021-04-27 LAB — CBC
HCT: 44.5 % (ref 39.0–52.0)
Hemoglobin: 16 g/dL (ref 13.0–17.0)
MCH: 31.6 pg (ref 26.0–34.0)
MCHC: 36 g/dL (ref 30.0–36.0)
MCV: 87.9 fL (ref 80.0–100.0)
Platelets: 208 10*3/uL (ref 150–400)
RBC: 5.06 MIL/uL (ref 4.22–5.81)
RDW: 12.8 % (ref 11.5–15.5)
WBC: 11.7 10*3/uL — ABNORMAL HIGH (ref 4.0–10.5)
nRBC: 0 % (ref 0.0–0.2)

## 2021-04-27 LAB — LIPASE, BLOOD: Lipase: 24 U/L (ref 11–51)

## 2021-04-27 SURGERY — APPENDECTOMY, LAPAROSCOPIC
Anesthesia: General

## 2021-04-27 MED ORDER — DEXAMETHASONE SODIUM PHOSPHATE 10 MG/ML IJ SOLN
INTRAMUSCULAR | Status: DC | PRN
Start: 2021-04-27 — End: 2021-04-27
  Administered 2021-04-27: 10 mg via INTRAVENOUS

## 2021-04-27 MED ORDER — FENTANYL CITRATE (PF) 100 MCG/2ML IJ SOLN
INTRAMUSCULAR | Status: DC | PRN
  Administered 2021-04-27 (×2): 50 ug via INTRAVENOUS

## 2021-04-27 MED ORDER — PIPERACILLIN-TAZOBACTAM 3.375 G IVPB 30 MIN
3.3750 g | Freq: Once | INTRAVENOUS | Status: AC
  Administered 2021-04-27: 3.375 g via INTRAVENOUS
  Filled 2021-04-27: qty 50

## 2021-04-27 MED ORDER — 0.9 % SODIUM CHLORIDE (POUR BTL) OPTIME
TOPICAL | Status: DC | PRN
  Administered 2021-04-27: 200 mL

## 2021-04-27 MED ORDER — MORPHINE SULFATE (PF) 2 MG/ML IV SOLN: 2.0000 mg | INTRAVENOUS | Status: DC | PRN

## 2021-04-27 MED ORDER — KETOROLAC TROMETHAMINE 30 MG/ML IJ SOLN
INTRAMUSCULAR | Status: AC
  Filled 2021-04-27: qty 1

## 2021-04-27 MED ORDER — KETOROLAC TROMETHAMINE 30 MG/ML IJ SOLN
INTRAMUSCULAR | Status: DC | PRN
  Administered 2021-04-27: 30 mg via INTRAVENOUS

## 2021-04-27 MED ORDER — OXYCODONE HCL 5 MG PO TABS: 5.0000 mg | ORAL_TABLET | Freq: Four times a day (QID) | ORAL | 0 refills | Status: DC | PRN

## 2021-04-27 MED ORDER — ACETAMINOPHEN 500 MG PO TABS: 1000.0000 mg | ORAL_TABLET | Freq: Four times a day (QID) | ORAL | Status: DC

## 2021-04-27 MED ORDER — OXYCODONE HCL 5 MG PO TABS
5.0000 mg | ORAL_TABLET | Freq: Once | ORAL | Status: AC
  Administered 2021-04-27: 5 mg via ORAL

## 2021-04-27 MED ORDER — IOHEXOL 300 MG/ML  SOLN
100.0000 mL | Freq: Once | INTRAMUSCULAR | Status: AC | PRN
  Administered 2021-04-27: 100 mL via INTRAVENOUS
  Filled 2021-04-27: qty 100

## 2021-04-27 MED ORDER — SUGAMMADEX SODIUM 200 MG/2ML IV SOLN
INTRAVENOUS | Status: DC | PRN
  Administered 2021-04-27: 200 mg via INTRAVENOUS

## 2021-04-27 MED ORDER — ONDANSETRON HCL 4 MG/2ML IJ SOLN
INTRAMUSCULAR | Status: DC | PRN
Start: 2021-04-27 — End: 2021-04-27
  Administered 2021-04-27: 4 mg via INTRAVENOUS

## 2021-04-27 MED ORDER — SODIUM CHLORIDE 0.9 % IV SOLN: INTRAVENOUS | Status: DC

## 2021-04-27 MED ORDER — ROCURONIUM BROMIDE 100 MG/10ML IV SOLN
INTRAVENOUS | Status: DC | PRN
Start: 2021-04-27 — End: 2021-04-27
  Administered 2021-04-27: 50 mg via INTRAVENOUS

## 2021-04-27 MED ORDER — LIDOCAINE-EPINEPHRINE 1 %-1:100000 IJ SOLN
INTRAMUSCULAR | Status: DC | PRN
  Administered 2021-04-27: 15 mL

## 2021-04-27 MED ORDER — ACETAMINOPHEN 500 MG PO TABS: 1000.0000 mg | ORAL_TABLET | Freq: Four times a day (QID) | ORAL | 0 refills | Status: AC | PRN

## 2021-04-27 MED ORDER — ONDANSETRON HCL 4 MG/2ML IJ SOLN: 4.0000 mg | Freq: Four times a day (QID) | INTRAMUSCULAR | Status: DC | PRN

## 2021-04-27 MED ORDER — MIDAZOLAM HCL 2 MG/2ML IJ SOLN
INTRAMUSCULAR | Status: AC
  Filled 2021-04-27: qty 2

## 2021-04-27 MED ORDER — PROPOFOL 10 MG/ML IV BOLUS
INTRAVENOUS | Status: DC | PRN
  Administered 2021-04-27: 50 mg via INTRAVENOUS
  Administered 2021-04-27: 150 mg via INTRAVENOUS

## 2021-04-27 MED ORDER — BUPIVACAINE HCL (PF) 0.25 % IJ SOLN
INTRAMUSCULAR | Status: AC
  Filled 2021-04-27: qty 30

## 2021-04-27 MED ORDER — LIDOCAINE-EPINEPHRINE 1 %-1:100000 IJ SOLN
INTRAMUSCULAR | Status: AC
  Filled 2021-04-27: qty 1

## 2021-04-27 MED ORDER — FENTANYL CITRATE (PF) 100 MCG/2ML IJ SOLN: 25.0000 ug | INTRAMUSCULAR | Status: DC | PRN

## 2021-04-27 MED ORDER — LIDOCAINE HCL (CARDIAC) PF 100 MG/5ML IV SOSY
PREFILLED_SYRINGE | INTRAVENOUS | Status: DC | PRN
  Administered 2021-04-27: 100 mg via INTRAVENOUS

## 2021-04-27 MED ORDER — MIDAZOLAM HCL 2 MG/2ML IJ SOLN
INTRAMUSCULAR | Status: DC | PRN
  Administered 2021-04-27: 2 mg via INTRAVENOUS

## 2021-04-27 MED ORDER — HEMOSTATIC AGENTS (NO CHARGE) OPTIME
TOPICAL | Status: DC | PRN
  Administered 2021-04-27: 1 via TOPICAL

## 2021-04-27 MED ORDER — IBUPROFEN 800 MG PO TABS: 800.0000 mg | ORAL_TABLET | Freq: Three times a day (TID) | ORAL | 0 refills | Status: DC | PRN

## 2021-04-27 MED ORDER — PIPERACILLIN-TAZOBACTAM 3.375 G IVPB: 3.3750 g | Freq: Three times a day (TID) | INTRAVENOUS | Status: DC

## 2021-04-27 MED ORDER — ONDANSETRON 4 MG PO TBDP: 4.0000 mg | ORAL_TABLET | Freq: Four times a day (QID) | ORAL | Status: DC | PRN

## 2021-04-27 MED ORDER — DEXAMETHASONE SODIUM PHOSPHATE 10 MG/ML IJ SOLN
INTRAMUSCULAR | Status: AC
  Filled 2021-04-27: qty 1

## 2021-04-27 MED ORDER — FENTANYL CITRATE (PF) 100 MCG/2ML IJ SOLN
INTRAMUSCULAR | Status: AC
  Filled 2021-04-27: qty 2

## 2021-04-27 MED ORDER — OXYCODONE HCL 5 MG PO TABS
ORAL_TABLET | ORAL | Status: AC
  Filled 2021-04-27: qty 1

## 2021-04-27 MED ORDER — ROCURONIUM BROMIDE 10 MG/ML (PF) SYRINGE
PREFILLED_SYRINGE | INTRAVENOUS | Status: AC
  Filled 2021-04-27: qty 10

## 2021-04-27 MED ORDER — LIDOCAINE HCL (PF) 2 % IJ SOLN
INTRAMUSCULAR | Status: AC
  Filled 2021-04-27: qty 5

## 2021-04-27 MED ORDER — SODIUM CHLORIDE 0.9 % IR SOLN
Status: DC | PRN
  Administered 2021-04-27: 100 mL

## 2021-04-27 MED ORDER — PROPOFOL 10 MG/ML IV BOLUS
INTRAVENOUS | Status: AC
  Filled 2021-04-27: qty 20

## 2021-04-27 MED ORDER — OXYCODONE HCL 5 MG PO TABS: 5.0000 mg | ORAL_TABLET | ORAL | Status: DC | PRN

## 2021-04-27 MED ORDER — ONDANSETRON HCL 4 MG/2ML IJ SOLN
INTRAMUSCULAR | Status: AC
  Filled 2021-04-27: qty 2

## 2021-04-27 SURGICAL SUPPLY — 55 items
ADH SKN CLS APL DERMABOND .7 (GAUZE/BANDAGES/DRESSINGS) ×1
APL LAPSCP 35 DL APL RGD (MISCELLANEOUS) ×1
APL PRP STRL LF DISP 70% ISPRP (MISCELLANEOUS) ×1
APL SWBSTK 6 STRL LF DISP (MISCELLANEOUS) ×1
APPLICATOR COTTON TIP 6 STRL (MISCELLANEOUS) ×1 IMPLANT
APPLICATOR COTTON TIP 6IN STRL (MISCELLANEOUS) ×2 IMPLANT
APPLICATOR VISTASEAL 35 (MISCELLANEOUS) ×2 IMPLANT
BAG SPEC RTRVL LRG 6X4 10 (ENDOMECHANICALS) ×1
BLADE CLIPPER SURG (BLADE) ×2 IMPLANT
BLADE SURG SZ11 CARB STEEL (BLADE) ×2 IMPLANT
CHLORAPREP W/TINT 26 (MISCELLANEOUS) ×2 IMPLANT
CUTTER FLEX LINEAR 45M (STAPLE) ×2 IMPLANT
DEFOGGER SCOPE WARMER CLEARIFY (MISCELLANEOUS) ×2 IMPLANT
DERMABOND ADVANCED (GAUZE/BANDAGES/DRESSINGS) ×1
DERMABOND ADVANCED .7 DNX12 (GAUZE/BANDAGES/DRESSINGS) ×1 IMPLANT
ELECT CAUTERY BLADE TIP 2.5 (TIP) ×2
ELECT REM PT RETURN 9FT ADLT (ELECTROSURGICAL) ×2
ELECTRODE CAUTERY BLDE TIP 2.5 (TIP) ×1 IMPLANT
ELECTRODE REM PT RTRN 9FT ADLT (ELECTROSURGICAL) ×1 IMPLANT
GAUZE 4X4 16PLY ~~LOC~~+RFID DBL (SPONGE) ×2 IMPLANT
GLOVE SURG SYN 6.5 ES PF (GLOVE) ×2 IMPLANT
GLOVE SURG UNDER LTX SZ7 (GLOVE) ×2 IMPLANT
GOWN STRL REUS W/ TWL LRG LVL3 (GOWN DISPOSABLE) ×2 IMPLANT
GOWN STRL REUS W/TWL LRG LVL3 (GOWN DISPOSABLE) ×4
GRASPER SUT TROCAR 14GX15 (MISCELLANEOUS) ×2 IMPLANT
HEMOSTAT SURGICEL 2X3 (HEMOSTASIS) ×2 IMPLANT
IRRIGATION STRYKERFLOW (MISCELLANEOUS) ×1 IMPLANT
IRRIGATOR STRYKERFLOW (MISCELLANEOUS) ×2
IV NS 1000ML (IV SOLUTION) ×2
IV NS 1000ML BAXH (IV SOLUTION) ×1 IMPLANT
KIT TURNOVER KIT A (KITS) ×2 IMPLANT
KITTNER LAPARASCOPIC 5X40 (MISCELLANEOUS) ×2 IMPLANT
LABEL OR SOLS (LABEL) ×2 IMPLANT
MANIFOLD NEPTUNE II (INSTRUMENTS) ×2 IMPLANT
NEEDLE HYPO 22GX1.5 SAFETY (NEEDLE) ×2 IMPLANT
NS IRRIG 500ML POUR BTL (IV SOLUTION) ×2 IMPLANT
PACK LAP CHOLECYSTECTOMY (MISCELLANEOUS) ×2 IMPLANT
PENCIL ELECTRO HAND CTR (MISCELLANEOUS) ×2 IMPLANT
POUCH SPECIMEN RETRIEVAL 10MM (ENDOMECHANICALS) ×2 IMPLANT
RELOAD STAPLE TA45 3.5 REG BLU (ENDOMECHANICALS) ×2 IMPLANT
SCISSORS METZENBAUM CVD 33 (INSTRUMENTS) ×2 IMPLANT
SET TUBE SMOKE EVAC HIGH FLOW (TUBING) ×2 IMPLANT
SHEARS HARMONIC ACE PLUS 36CM (ENDOMECHANICALS) ×2 IMPLANT
SLEEVE ADV FIXATION 5X100MM (TROCAR) ×2 IMPLANT
STRIP CLOSURE SKIN 1/2X4 (GAUZE/BANDAGES/DRESSINGS) ×2 IMPLANT
SUT MNCRL 4-0 (SUTURE) ×2
SUT MNCRL 4-0 27XMFL (SUTURE) ×1
SUT VIC AB 3-0 SH 27 (SUTURE) ×2
SUT VIC AB 3-0 SH 27X BRD (SUTURE) ×1 IMPLANT
SUT VICRYL 0 AB UR-6 (SUTURE) ×2 IMPLANT
SUTURE MNCRL 4-0 27XMF (SUTURE) ×1 IMPLANT
SYS KII FIOS ACCESS ABD 5X100 (TROCAR) ×2
SYSTEM KII FIOS ACES ABD 5X100 (TROCAR) ×1 IMPLANT
TRAY FOLEY MTR SLVR 16FR STAT (SET/KITS/TRAYS/PACK) ×2 IMPLANT
TROCAR BALLN GELPORT 12X130M (ENDOMECHANICALS) ×2 IMPLANT

## 2021-04-27 NOTE — Anesthesia Preprocedure Evaluation (Signed)
Anesthesia Evaluation  Patient identified by MRN, date of birth, ID band Patient awake    Reviewed: Allergy & Precautions, H&P , NPO status , Patient's Chart, lab work & pertinent test results, reviewed documented beta blocker date and time   History of Anesthesia Complications (+) PROLONGED EMERGENCE and history of anesthetic complications  Airway Mallampati: II  TM Distance: >3 FB Neck ROM: full    Dental  (+) Dental Advidsory Given, Caps, Teeth Intact   Pulmonary neg shortness of breath, sleep apnea and Continuous Positive Airway Pressure Ventilation , neg COPD, neg recent URI,    Pulmonary exam normal breath sounds clear to auscultation       Cardiovascular Exercise Tolerance: Good hypertension, (-) angina(-) Past MI Normal cardiovascular exam(-) dysrhythmias (-) Valvular Problems/Murmurs Rhythm:regular Rate:Normal     Neuro/Psych negative neurological ROS  negative psych ROS   GI/Hepatic negative GI ROS, Neg liver ROS,   Endo/Other  diabetes (diet controlled), Well Controlled  Renal/GU negative Renal ROS  negative genitourinary   Musculoskeletal   Abdominal   Peds  Hematology negative hematology ROS (+)   Anesthesia Other Findings Past Medical History: No date: Complication of anesthesia     Comment:  slow to wake after 1 surgery No date: CPAP use counseling No date: HTN (hypertension) No date: OSA on CPAP No date: Seasonal allergies   Reproductive/Obstetrics negative OB ROS                             Anesthesia Physical Anesthesia Plan  ASA: 2  Anesthesia Plan: General   Post-op Pain Management:    Induction: Intravenous  PONV Risk Score and Plan: 3 and Ondansetron, Dexamethasone and Treatment may vary due to age or medical condition  Airway Management Planned: Oral ETT  Additional Equipment:   Intra-op Plan:   Post-operative Plan: Extubation in OR  Informed  Consent: I have reviewed the patients History and Physical, chart, labs and discussed the procedure including the risks, benefits and alternatives for the proposed anesthesia with the patient or authorized representative who has indicated his/her understanding and acceptance.     Dental Advisory Given  Plan Discussed with: Anesthesiologist, CRNA and Surgeon  Anesthesia Plan Comments:         Anesthesia Quick Evaluation

## 2021-04-27 NOTE — H&P (Addendum)
[facility] SURGICAL HISTORY & PHYSICAL (cpt [phone])  HISTORY OF PRESENT ILLNESS (HPI):  75 y.o. male presented to [facility] ED today for abdominal pain. Patient reports that around 5PM last night he began to notice a pressure/dull aching pain in his RLQ/ He did not think much of this but it persisted throughout the night, which prompted his presentation. He endorsed subjective chills with the pain and a decreased appetite. Palpation seems to make this worse. No fever, cough, congestion, SOB, CP, nausea, emesis, or urinary changes. He has a history of laparoscopic prostatectomy and bilateral inguinal hernia repairs at outside facilities. Laboratory work up in the ED revealed a mild leukocytosis to 11.7K but was otherwise reassuring. He did have a CT Abdomen/Pelvis which was concerning for acute uncomplicated appendicitis with appendicolith  Interestingly, on the CT obtained today, he was also found to have a large left renal mass concerning for likely malignant process. Again, he denied any urinary symptoms, there was no hematuria on UA, and his sCr was normal at - 1.01. No reported history of renal cancer in family.    General surgery is consulted by emergency medicine physician Dr Merlyn Lot, for evaluation and management of acute appendicitis.   PAST MEDICAL HISTORY (PMH):  Past Medical History:  Diagnosis Date   Complication of anesthesia    slow to wake after 1 surgery   CPAP use counseling    HTN (hypertension)    OSA on CPAP    Seasonal allergies     Reviewed. Otherwise negative.   PAST SURGICAL HISTORY ([place]):  Past Surgical History:  Procedure Laterality Date   HERNIA REPAIR     prostatectomy     REPAIR KNEE LIGAMENT     SHOULDER ARTHROSCOPY WITH SUBACROMIAL DECOMPRESSION AND OPEN ROTATOR C Right 12/29/2020   Procedure: Right shoulder arthroscopic rotator cuff repair, subacromial decompression, and biceps tenodesis;  Surgeon: Leim Fabry, MD;  Location: [facility];  Service: Orthopedics;  Laterality: Right;   TONSILLECTOMY     VASECTOMY      Reviewed. Otherwise negative.   MEDICATIONS:  Prior to Admission medications   Medication Sig Start Date End Date Taking? Authorizing Provider  acetaminophen (TYLENOL) 500 MG tablet Take 2 tablets (1,000 mg total) by mouth every 8 (eight) hours. 12/29/20 12/29/21  Leim Fabry, MD  [facility] ALLERGY 60 MG tablet Take 1 tablet by mouth daily as needed. 01/13/20   [provider]  Coenzyme Q10 10 MG capsule Take 1 capsule by mouth daily.    [provider]  ezetimibe (ZETIA) 10 MG tablet Take 10 mg by mouth daily. 06/13/20   [provider]  fluticasone (FLONASE) 50 MCG/ACT nasal spray Place 2 sprays into the nose daily as needed.    [provider]  gabapentin (NEURONTIN) 400 MG capsule Take 400 mg by mouth at bedtime. 06/01/20   [provider]  hydrochlorothiazide (HYDRODIURIL) 25 MG tablet Take 25 mg by mouth daily. 05/30/20   [provider]  losartan (COZAAR) 100 MG tablet Take 100 mg by mouth daily. 05/30/20   [provider]  Multiple Vitamin (MULTIVITAMIN) capsule Take 1 capsule by mouth daily.    [provider]  Multiple Vitamins-Minerals (OCUVITE EYE HEATLH GUMMIES) CHEW Chew 1 tablet by mouth daily. 01/13/20   [provider]  ondansetron (ZOFRAN ODT) 4 MG disintegrating tablet Take 1 tablet (4 mg total) by mouth every 8 (eight) hours as needed for nausea or vomiting. Patient not taking: Reported on 03/05/2021  12/29/20   Leim Fabry, MD  oxyCODONE (ROXICODONE) 5 MG immediate release tablet Take 1-2 tablets (5-10 mg total) by mouth every 4 (four) hours as needed (pain). Patient not taking: Reported on 03/05/2021 12/29/20 12/29/21  Leim Fabry, MD  VITAMIN D PO Take by mouth.    [provider]     ALLERGIES:  Allergies  Allergen Reactions   Other Other (See Comments)   Statins     Muscle ache     SOCIAL HISTORY:   Social History   Socioeconomic History   Marital status: Married    Spouse name: Not on file   Number of children: Not on file   Years of education: Not on file   Highest education level: Not on file  Occupational History   Not on file  Tobacco Use   Smoking status: Never   Smokeless tobacco: Never  Vaping Use   Vaping Use: Never used  Substance and Sexual Activity   Alcohol use: Not Currently   Drug use: Never   Sexual activity: Not Currently  Other Topics Concern   Not on file  Social History Narrative   Not on file   Social Determinants of Health   Financial Resource Strain: Not on file  Food Insecurity: Not on file  Transportation Needs: Not on file  Physical Activity: Not on file  Stress: Not on file  Social Connections: Not on file  Intimate Partner Violence: Not on file     FAMILY HISTORY:  Family History  Problem Relation Age of Onset   Stroke Mother    Hypertension Father    Emphysema Father     Otherwise negative.   REVIEW OF SYSTEMS:  Review of Systems  Constitutional:  Positive for chills. Negative for fever and weight loss.       + decreased appetite   HENT:  Negative for congestion and sore throat.   Respiratory:  Negative for cough and shortness of breath.   Cardiovascular:  Negative for chest pain and palpitations.  Gastrointestinal:  Positive for abdominal pain. Negative for diarrhea, nausea and vomiting.  Genitourinary:  Negative for dysuria, hematuria and urgency.  All other systems reviewed and are negative.  VITAL SIGNS:  Temp:  [98.2 F (36.8 C)] 98.2 F (36.8 C) (07/01 1050) Pulse Rate:  [70] 70 (07/01 1050) Resp:  [16] 16 (07/01 1050) BP: (110)/(68) 110/68 (07/01 1050) SpO2:  [97 %] 97 % (07/01 1050) Weight:  [107 kg] 107 kg (07/01 1051)     Height: 5\' 9"  (175.3 cm) Weight: 107 kg BMI (Calculated): 34.84   PHYSICAL EXAM:  Physical Exam Vitals and nursing note reviewed. Exam conducted with a chaperone present.   Constitutional:      General: He is not in acute distress.    Appearance: He is well-developed. He is obese. He is not ill-appearing.     Comments: Patient resting comfortably in bed, daughter at bedside   HENT:     Head: Normocephalic and atraumatic.  Eyes:     General: No scleral icterus.    Extraocular Movements: Extraocular movements intact.  Cardiovascular:     Rate and Rhythm: Normal rate.     Heart sounds: Normal heart sounds. No murmur heard. Pulmonary:     Effort: Pulmonary effort is normal. No respiratory distress.     Breath sounds: Normal breath sounds.  Abdominal:     General: Abdomen is protuberant. A surgical scar is present. There is no distension.     Palpations: Abdomen  is soft.     Tenderness: There is abdominal tenderness in the right lower quadrant. There is no guarding or rebound. Positive signs include McBurney's sign. Negative signs include Rovsing's sign.     Comments: Abdomen is protuberant consistent with level of obesity, soft, RLQ tenderness with positive McBurney's Point, non-distended, no rebound.guarding. Previous laparoscopic scars present  Genitourinary:    Comments: Deferred Skin:    General: Skin is warm and dry.     Coloration: Skin is not jaundiced or pale.  Neurological:     General: No focal deficit present.     Mental Status: He is alert and oriented to person, place, and time.  Psychiatric:        Mood and Affect: Mood normal.        Behavior: Behavior normal.    INTAKE/OUTPUT:  This shift: No intake/output data recorded.  Last 2 shifts: @IOLAST2SHIFTS @  Labs:  CBC Latest Ref Rng & Units 04/27/2021  WBC 4.0 - 10.5 K/uL 11.7(H)  Hemoglobin 13.0 - 17.0 g/dL 16.0  Hematocrit 39.0 - 52.0 % 44.5  Platelets 150 - 400 K/uL 208   CMP Latest Ref Rng & Units 04/27/2021  Glucose 70 - 99 mg/dL 169(H)  BUN 8 - 23 mg/dL 14  Creatinine 0.61 - 1.24 mg/dL 1.01  Sodium 135 - 145 mmol/L 135  Potassium 3.5 - 5.1 mmol/L 3.5  Chloride 98 - 111  mmol/L 99  CO2 22 - 32 mmol/L 29  Calcium 8.9 - 10.3 mg/dL 9.1  Total Protein 6.5 - 8.1 g/dL 6.6  Total Bilirubin 0.3 - 1.2 mg/dL 0.9  Alkaline Phos 38 - 126 U/L 49  AST 15 - 41 U/L 17  ALT 0 - 44 U/L 16     Imaging studies:    CT Abdomen/Pelvis (04/27/2021) personally reviewed which does show an inflamed appendix with appendicolith without abscess or free air, additionally found to have large left renal mass concerning for malignancy, and there is a very focal area of narrowing in the descending colon on coronal views, and radiologist report reviewed below: IMPRESSION: 1. Acute appendicitis without evidence of perforation or abscess. 2. 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 3. Focal annular thickening of descending colon (image 27, series 6) may be due to under distension, however correlation with colonoscopy should be performed to exclude an underlying mass.   Assessment/Plan: (ICD-10's: K35.30) 75 y.o. male with RLQ abdominal pain and leukocytosis concerning for acute uncomplicated appendicitis, complicated by incidental finding of large left renal mass concerning for malignancy.    - Will admit to general surgery  - Plan for laparoscopic appendectomy this afternoon with Dr Celine Ahr pending OR/Anesthesia availability - All risks, benefits, and alternatives to above procedure(s) were discussed with the patient and his family (at bedside and on the phone), all of their questions were answered to their expressed satisfaction, patient expresses he wishes to proceed, and informed consent was obtained - NPO + IVF Resuscitation - IV Abx (Zosyn) - Monitor abdominal examination; on-going bowel function - Pain control prn; antiemetics prn .    - I did review the case and images with Dr Louis Meckel (urology) who is covering [facility]. Recommend close outpatient follow up with Dr Erlene Quan or Dr Diamantina Providence next week. I have place outpatient follow up in his  discharge instructions, and I will route this chart. Family and patient updated on findings and recommendations as well  - He will also benefit from GI referral as outpatient for  possible colonoscopy to evaluate area of potential narrowing in descending colon seen on coronal images.    All of the above findings and recommendations were discussed with the patient and his family (daughter at bedside and daughter on phone), and all of their questions were answered to their expressed satisfaction.  -- Edison Simon, PA-C [facility] 04/27/2021, 1:31 PM [phone] M-F: 7am - 4pm  I saw and evaluated the patient.  I agree with the above documentation, exam, and plan, which I have edited where appropriate. Fredirick Maudlin  3:17 PM

## 2021-04-27 NOTE — Transfer of Care (Signed)
Immediate Anesthesia Transfer of Care Note  Patient: Peter Stuart  Procedure(s) Performed: APPENDECTOMY LAPAROSCOPIC  Patient Location: PACU  Anesthesia Type:General  Level of Consciousness: drowsy  Airway & Oxygen Therapy: Patient Spontanous Breathing and Patient connected to face mask oxygen  Post-op Assessment: Report given to RN and Post -op Vital signs reviewed and stable  Post vital signs: Reviewed and stable  Last Vitals:  Vitals Value Taken Time  BP 121/79 04/27/21 1700  Temp    Pulse 72 04/27/21 1702  Resp 13 04/27/21 1702  SpO2 91 % 04/27/21 1702  Vitals shown include unvalidated device data.  Last Pain:  Vitals:   04/27/21 1517  TempSrc: Temporal  PainSc: 3          Complications: No notable events documented.

## 2021-04-27 NOTE — Anesthesia Postprocedure Evaluation (Signed)
Anesthesia Post Note  Patient: Peter Stuart  Procedure(s) Performed: APPENDECTOMY LAPAROSCOPIC  Patient location during evaluation: PACU Anesthesia Type: General Level of consciousness: awake and alert Pain management: pain level controlled Vital Signs Assessment: post-procedure vital signs reviewed and stable Respiratory status: spontaneous breathing, nonlabored ventilation, respiratory function stable and patient connected to nasal cannula oxygen Cardiovascular status: blood pressure returned to baseline and stable Postop Assessment: no apparent nausea or vomiting Anesthetic complications: no   No notable events documented.   Last Vitals:  Vitals:   04/27/21 1742 04/27/21 1753  BP: (P) 121/75 121/69  Pulse:  81  Resp:  16  Temp: (!) (P) 36.4 C (!) 36.4 C  SpO2:  94%    Last Pain:  Vitals:   04/27/21 1753  TempSrc: Temporal  PainSc: 0-No pain                 Martha Clan

## 2021-04-27 NOTE — ED Notes (Signed)
Patient transported to CT 

## 2021-04-27 NOTE — Anesthesia Procedure Notes (Signed)
Procedure Name: Intubation Date/Time: 04/27/2021 3:48 PM Performed by: Esaw Grandchild, CRNA Pre-anesthesia Checklist: Patient identified, Emergency Drugs available, Suction available and Patient being monitored Patient Re-evaluated:Patient Re-evaluated prior to induction Oxygen Delivery Method: Circle system utilized Preoxygenation: Pre-oxygenation with 100% oxygen Induction Type: IV induction Ventilation: Mask ventilation without difficulty Laryngoscope Size: Miller and 2 Grade View: Grade I Tube type: Oral Tube size: 7.5 mm Number of attempts: 1 Airway Equipment and Method: Stylet and Oral airway Placement Confirmation: ETT inserted through vocal cords under direct vision, positive ETCO2 and breath sounds checked- equal and bilateral Secured at: 23 cm Tube secured with: Tape Dental Injury: Teeth and Oropharynx as per pre-operative assessment

## 2021-04-27 NOTE — Op Note (Signed)
Operative Note  Laparoscopic Appendectomy   Peter Stuart Date of operation:  04/27/2021  Indications: The patient presented with a history of  abdominal pain. Workup has revealed findings consistent with acute appendicitis.  Pre-operative Diagnosis: Other appendicitis  Post-operative Diagnosis: Same  Surgeon: Fredirick Maudlin, MD  Anesthesia: GETA  Findings: Inflamed appendix without perforation or abscess  Estimated Blood Loss: Less than 5 cc         Specimens: appendix         Complications: None immediately apparent  Procedure Details  The patient was seen again in the preop area. The options of surgery versus observation were reviewed with the patient and/or family. The risks of bleeding, infection, recurrence of symptoms, negative laparoscopy, potential for an open procedure, bowel injury, abscess or infection, were all reviewed as well. The patient was taken to Operating Room, identified as Peter Stuart and the procedure verified as laparoscopic appendectomy. A time out was performed and the above information confirmed.  The patient was placed in the supine position and general anesthesia was induced.  Antibiotic prophylaxis was administered and VT E prophylaxis was in place. A Foley catheter was placed by the nursing staff.   The abdomen was prepped and draped in a sterile fashion.  Optiview technique was used to enter the abdomen in the left upper quadrant at Palmer's point.  Pneumoperitoneum obtained without hemodynamic changes.  A 12 mm supraumbilical port and two 5 mm working ports were placed under direct visualization.  The appendix was identified and found to be acutely inflamed and retrocecal.  There was no evidence of perforation and no abscess identified.The appendix was carefully dissected away from the surrounding tissues. The mesoappendix was divided with the harmonic scalpel. The base of the appendix was dissected out and divided with a standard load Endo GIA.The  appendix was placed in a Endo Catch bag and removed via the 12 mm port. The right lower quadrant and pelvis was then irrigated with normal saline which was then aspirated.  Surgicel and Vistaseal were applied over the staple line and mesoappendix for additional hemostasis.  The right lower quadrant was inspected there was no sign of bleeding or bowel injury therefore pneumoperitoneum was released, all ports were removed.  The umbilical fascia was closed with 0 Vicryl interrupted sutures and the skin incisions were approximated with subcuticular 4-0 Monocryl. Dermabond was applied followed by Steri-Strips. The patient tolerated the procedure well and there were no immediately apparent complication. The sponge lap and needle count were correct at the end of the procedure.  The patient was taken to the recovery room in stable condition to be admitted for continued care.   Fredirick Maudlin, MD, FACS

## 2021-04-27 NOTE — ED Notes (Signed)
OR provider at the bedside

## 2021-04-27 NOTE — ED Triage Notes (Signed)
Pt c/o RLQ pain for the past 2-3 days worse in the past 12 hrs, denies N/V/D, fever.Marland Kitchen

## 2021-04-27 NOTE — Discharge Instructions (Signed)

## 2021-04-27 NOTE — ED Provider Notes (Signed)
[facility] Emergency Department Provider Note    Event Date/Time   First MD Initiated Contact with Patient 04/27/21 1113     (approximate)  I have reviewed the triage vital signs and the nursing notes.   HISTORY  Chief Complaint Abdominal Pain    HPI Peter Stuart is a 75 y.o. male presents to the ER for evaluation of 2 days of progressively worsening right lower quadrant abdominal pain.  Is never had symptoms like this before.  Denies any nausea or vomiting.  Describes it as a "catch ".  No measured fevers.  Does still have his appendix.  No heavy lifting or trauma.  No dysuria or hematuria.  Past Medical History:  Diagnosis Date   Complication of anesthesia    slow to wake after 1 surgery   CPAP use counseling    HTN (hypertension)    OSA on CPAP    Seasonal allergies    Family History  Problem Relation Age of Onset   Stroke Mother    Hypertension Father    Emphysema Father    Past Surgical History:  Procedure Laterality Date   HERNIA REPAIR     prostatectomy     REPAIR KNEE LIGAMENT     SHOULDER ARTHROSCOPY WITH SUBACROMIAL DECOMPRESSION AND OPEN ROTATOR C Right 12/29/2020   Procedure: Right shoulder arthroscopic rotator cuff repair, subacromial decompression, and biceps tenodesis;  Surgeon: Leim Fabry, MD;  Location: 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 Authorizing Provider  acetaminophen (TYLENOL) 500 MG tablet Take 2 tablets (1,000 mg total) by mouth every 8 (eight) hours. 12/29/20 12/29/21  Leim Fabry, MD  [facility] ALLERGY 60 MG tablet Take 1 tablet by mouth daily as needed. 01/13/20   [provider]  Coenzyme Q10 10 MG capsule Take 1 capsule by mouth daily.    [provider]  ezetimibe (ZETIA) 10 MG tablet Take 10 mg by mouth daily. 06/13/20   [provider]  fluticasone (FLONASE) 50 MCG/ACT nasal spray Place 2 sprays into the nose daily as needed.    [provider]  gabapentin (NEURONTIN) 400 MG capsule Take 400 mg by mouth at bedtime. 06/01/20   [provider]  hydrochlorothiazide (HYDRODIURIL) 25 MG tablet Take 25 mg by mouth daily. 05/30/20   [provider]  losartan (COZAAR) 100 MG tablet Take 100 mg by mouth daily. 05/30/20   [provider]  Multiple Vitamin (MULTIVITAMIN) capsule Take 1 capsule by mouth daily.    [provider]  Multiple Vitamins-Minerals (OCUVITE EYE HEATLH GUMMIES) CHEW Chew 1 tablet by mouth daily. 01/13/20  [provider]  ondansetron (ZOFRAN ODT) 4 MG disintegrating tablet Take 1 tablet (4 mg total) by mouth every 8 (eight) hours as needed for nausea or vomiting. Patient not taking: Reported on 03/05/2021 12/29/20   Leim Fabry, MD  oxyCODONE (ROXICODONE) 5 MG immediate release tablet Take 1-2 tablets (5-10 mg total) by mouth every 4 (four) hours as needed (pain). Patient not taking: Reported on 03/05/2021 12/29/20 12/29/21  Leim Fabry, MD  VITAMIN D PO Take by mouth.    [provider]    Allergies Other and Statins    Social History Social  History   Tobacco Use   Smoking status: Never   Smokeless tobacco: Never  Vaping Use   Vaping Use: Never used  Substance Use Topics   Alcohol use: Not Currently   Drug use: Never    Review of Systems Patient denies headaches, rhinorrhea, blurry vision, numbness, shortness of breath, chest pain, edema, cough, abdominal pain, nausea, vomiting, diarrhea, dysuria, fevers, rashes or hallucinations unless otherwise stated above in HPI. ____________________________________________   PHYSICAL EXAM:  VITAL SIGNS: Vitals:   04/27/21 1050  BP: 110/68  Pulse: 70  Resp: 16  Temp: 98.2 F (36.8 C)  SpO2: 97%    Constitutional: Alert and oriented.  Eyes: Conjunctivae are normal.  Head: Atraumatic. Nose: No congestion/rhinnorhea. Mouth/Throat: Mucous membranes are moist.   Neck: No stridor. Painless ROM.  Cardiovascular: Normal rate, regular rhythm. Grossly normal heart sounds.  Good peripheral circulation. Respiratory: Normal respiratory effort.  No retractions. Lungs CTAB. Gastrointestinal: Soft with ttp in rlq. No distention. No abdominal bruits. No CVA tenderness. Genitourinary:  Musculoskeletal: No lower extremity tenderness nor edema.  No joint effusions. Neurologic:  Normal speech and language. No gross focal neurologic deficits are appreciated. No facial droop Skin:  Skin is warm, dry and intact. No rash noted. Psychiatric: Mood and affect are normal. Speech and behavior are normal.  ____________________________________________   LABS (all labs ordered are listed, but only abnormal results are displayed)  Results for orders placed or performed during the hospital encounter of 04/27/21 (from the past 24 hour(s))  Lipase, blood     Status: None   Collection Time: 04/27/21 10:55 AM  Result Value Ref Range   Lipase 24 11 - 51 U/L  Comprehensive metabolic panel     Status: Abnormal   Collection Time: 04/27/21 10:55 AM  Result Value Ref Range   Sodium 135 135 - 145 mmol/L    Potassium 3.5 3.5 - 5.1 mmol/L   Chloride 99 98 - 111 mmol/L   CO2 29 22 - 32 mmol/L   Glucose, Bld 169 (H) 70 - 99 mg/dL   BUN 14 8 - 23 mg/dL   Creatinine, Ser 1.01 0.61 - 1.24 mg/dL   Calcium 9.1 8.9 - 10.3 mg/dL   Total Protein 6.6 6.5 - 8.1 g/dL   Albumin 4.0 3.5 - 5.0 g/dL   AST 17 15 - 41 U/L   ALT 16 0 - 44 U/L   Alkaline Phosphatase 49 38 - 126 U/L   Total Bilirubin 0.9 0.3 - 1.2 mg/dL   GFR, Estimated >60 >60 mL/min   Anion gap 7 5 - 15  CBC     Status: Abnormal   Collection Time: 04/27/21 10:55 AM  Result Value Ref Range   WBC 11.7 (H) 4.0 - 10.5 K/uL   RBC 5.06 4.22 - 5.81 MIL/uL   Hemoglobin 16.0 13.0 - 17.0 g/dL   HCT 44.5 39.0 - 52.0 %  MCV 87.9 80.0 - 100.0 fL   MCH 31.6 26.0 - 34.0 pg   MCHC 36.0 30.0 - 36.0 g/dL   RDW 12.8 11.5 - 15.5 %   Platelets 208 150 - 400 K/uL   nRBC 0.0 0.0 - 0.2 %  Urinalysis, Complete w Microscopic     Status: Abnormal   Collection Time: 04/27/21 10:55 AM  Result Value Ref Range   Color, Urine YELLOW (A) YELLOW   APPearance CLEAR (A) CLEAR   Specific Gravity, Urine 1.012 1.005 - 1.030   pH 5.0 5.0 - 8.0   Glucose, UA NEGATIVE NEGATIVE mg/dL   Hgb urine dipstick NEGATIVE NEGATIVE   Bilirubin Urine NEGATIVE NEGATIVE   Ketones, ur NEGATIVE NEGATIVE mg/dL   Protein, ur NEGATIVE NEGATIVE mg/dL   Nitrite NEGATIVE NEGATIVE   Leukocytes,Ua NEGATIVE NEGATIVE   WBC, UA NONE SEEN 0 - 5 WBC/hpf   Bacteria, UA NONE SEEN NONE SEEN   Squamous Epithelial / LPF 0-5 0 - 5   ____________________________________________ ____________________________________________  RADIOLOGY  I personally reviewed all radiographic images ordered to evaluate for the above acute complaints and reviewed radiology reports and findings.  These findings were personally discussed with the patient.  Please see medical record for radiology report.  ____________________________________________   PROCEDURES  Procedure(s) performed:   Procedures    Critical Care performed: no ____________________________________________   INITIAL IMPRESSION / ASSESSMENT AND PLAN / ED COURSE  Pertinent labs & imaging results that were available during my care of the patient were reviewed by me and considered in my medical decision making (see chart for details).   DDX: Appendicitis, colitis, diverticulitis, mass, Guild, UTI, musculoskeletal strain  Peter Stuart is a 75 y.o. who presents to the ED with presentation as described above.  Patient with exam and presentation concerning for acute appendicitis.  Mild leukocytosis.  Declining any IV pain medication at this time.  Will give IV hydration as well as order CT scan.  Clinical Course as of 04/27/21 1311  Fri Apr 27, 2021  1230 My review of CT to suggest evidence of acute appendicitis.  Will consult surgery [PR]    Clinical Course User Index [PR] Merlyn Lot, MD    The patient was evaluated in Emergency Department today for the symptoms described in the history of present illness. He/she was evaluated in the context of the global COVID-19 pandemic, which necessitated consideration that the patient might be at risk for infection with the SARS-CoV-2 virus that causes COVID-19. Institutional protocols and algorithms that pertain to the evaluation of patients at risk for COVID-19 are in a state of rapid change based on information released by regulatory bodies including the CDC and federal and state organizations. These policies and algorithms were followed during the patient's care in the ED.  As part of my medical decision making, I reviewed the following data within the [place] notes reviewed and incorporated, Labs reviewed, notes from prior ED visits and  Controlled Substance Database   ____________________________________________   FINAL CLINICAL IMPRESSION(S) / ED DIAGNOSES  Final diagnoses:  Other acute appendicitis      NEW  MEDICATIONS STARTED DURING THIS VISIT:  New Prescriptions   No medications on file     Note:  This document was prepared using Dragon voice recognition software and may include unintentional dictation errors.    Merlyn Lot, MD 04/27/21 1311

## 2021-04-27 NOTE — Discharge Summary (Signed)
Physician Discharge Summary   Patient ID: Peter Stuart [id number] 74 y.o. 1946/10/02  Admit date: 04/27/2021  Discharge date and time: 04/27/2021  5:58 PM   Admitting Physician: Fredirick Maudlin, MD   Discharge Physician: Fredirick Maudlin, MD  Admission Diagnoses: Acute appendicitis [K35.80] Other acute appendicitis [K35.890]  Discharge Diagnoses: Same, status post laparoscopic appendectomy  Admission Condition: fair  Discharged Condition: good  Indication for Admission: Acute appendicitis  Hospital Course: The patient presented to the emergency department with right lower quadrant pain.  A CT scan of the abdomen pelvis demonstrated acute appendicitis.  There was also a renal mass appreciated that will require outpatient follow-up.  The patient was taken to the operating room where he underwent a straightforward and uncomplicated laparoscopic appendectomy.  He was doing well enough in the recovery area that it was determined that he would be a suitable candidate for discharge to home.  Consults: None  Significant Diagnostic Studies: radiology: CT scan: Consistent with acute appendicitis, also demonstrating a left renal mass concerning for malignancy  Treatments: antibiotics: Zosyn, analgesia: Multimodal analgesia, and surgery: Laparoscopic appendectomy  Discharge Exam: Today's Vitals   04/27/21 1730 04/27/21 1737 04/27/21 1742 04/27/21 1753  BP:   (P) 121/75 121/69  Pulse: 70   81  Resp: 18   16  Temp:   (!) (P) 97.5 F (36.4 C) (!) 97.5 F (36.4 C)  TempSrc:    Temporal  SpO2: 94%   94%  Weight:      Height:      PainSc:  3   0-No pain   Body mass index is 34.85 kg/m.  He was alert and oriented.  Appropriate postsurgical abdominal tenderness.  Wounds without erythema, induration, or drainage.  Covered with Steri-Strips.  Disposition: Discharge disposition: 01-Home or Self Care       Patient Instructions:  Allergies as of 04/27/2021       Reactions   Other  Other (See Comments)   Statins    Muscle ache        Medication List     STOP taking these medications    ondansetron 4 MG disintegrating tablet Commonly known as: Zofran ODT       TAKE these medications    acetaminophen 500 MG tablet Commonly known as: TYLENOL Take 2 tablets (1,000 mg total) by mouth every 6 (six) hours as needed for up to 14 days for mild pain. What changed:  when to take this reasons to take this   [place] Allergy 60 MG tablet Generic drug: fexofenadine Take 1 tablet by mouth daily.   cholecalciferol 25 MCG (1000 UNIT) tablet Commonly known as: VITAMIN D3 Take 1,000 Units by mouth daily.   Coenzyme Q10 10 MG capsule Take 1 capsule by mouth daily.   ezetimibe 10 MG tablet Commonly known as: ZETIA Take 10 mg by mouth every Monday, Wednesday, and Friday.   fluticasone 50 MCG/ACT nasal spray Commonly known as: FLONASE Place 1 spray into the nose in the morning and at bedtime.   gabapentin 400 MG capsule Commonly known as: NEURONTIN Take 400 mg by mouth at bedtime.   hydrochlorothiazide 25 MG tablet Commonly known as: HYDRODIURIL Take 25 mg by mouth daily.   ibuprofen 800 MG tablet Commonly known as: ADVIL Take 1 tablet (800 mg total) by mouth every 8 (eight) hours as needed.   losartan 100 MG tablet Commonly known as: COZAAR Take 100 mg by mouth daily.   multivitamin capsule Take 1 capsule by mouth daily.  Ocuvite Eye Heatlh Gummies Chew Chew 1 tablet by mouth daily.   oxyCODONE 5 MG immediate release tablet Commonly known as: Oxy IR/ROXICODONE Take 1 tablet (5 mg total) by mouth every 6 (six) hours as needed for severe pain. What changed:  how much to take when to take this reasons to take this   terbinafine 250 MG tablet Commonly known as: LAMISIL Take 250 mg by mouth daily.               Discharge Care Instructions  (From admission, onward)           Start     Ordered   04/27/21 0000  Discharge wound  care:       Comments: You may shower starting on Sunday.  Do not submerge the incisions or allow them to become saturated with water or sweat.  It is okay if they get a little damp; just pat them dry gently with a clean soft towel.  Allow the Steri-Strips (paper tapes) to fall off on their own.   04/27/21 1706           Activity:  As discussed in discharge paperwork Diet:  As tolerated Wound Care:  As discussed in discharge paperwork  Follow-up with Edison Simon, PA-C in 2 weeks.  Signed: Fredirick Maudlin 04/27/2021 6:28 PM

## 2021-04-29 ENCOUNTER — Encounter: Payer: Self-pay | Admitting: General Surgery

## 2021-05-01 ENCOUNTER — Telehealth: Payer: Self-pay | Admitting: Urology

## 2021-05-01 NOTE — Telephone Encounter (Signed)
-----   Message from Tylene Fantasia, [place] sent at 04/27/2021  1:37 PM EDT ----- Sorry to bother you guys while you're off. I reviewed this patient with Dr Louis Meckel. 75 yo gentlemen we are seeing for appendicitis incidentally found to have large left renal mass. I will send him a referral to your office for follow up next week to see one of you two. Thanks in advance, and sorry again for bothering you. Just wanted to make you aware

## 2021-05-01 NOTE — Telephone Encounter (Signed)
This is a patient of Dr. Diamantina Providence who needs close follow-up this week for newly dx renal mass.  Please make sure he scheduled ASAP.  Hollice Espy, MD

## 2021-05-02 ENCOUNTER — Ambulatory Visit
Admission: RE | Admit: 2021-05-02 | Discharge: 2021-05-02 | Disposition: A | Discharge status: Home / Self Care | Payer: Medicare Other | Source: Ambulatory Visit | Attending: Urology | Admitting: Urology

## 2021-05-02 ENCOUNTER — Ambulatory Visit
Admission: RE | Admit: 2021-05-02 | Discharge: 2021-05-02 | Disposition: A | Discharge status: Home / Self Care | Payer: Medicare Other | Attending: Urology | Admitting: Urology

## 2021-05-02 ENCOUNTER — Ambulatory Visit (INDEPENDENT_AMBULATORY_CARE_PROVIDER_SITE_OTHER): Payer: Medicare Other | Admitting: Urology

## 2021-05-02 ENCOUNTER — Other Ambulatory Visit: Payer: Self-pay

## 2021-05-02 ENCOUNTER — Encounter: Payer: Self-pay | Admitting: Urology

## 2021-05-02 VITALS — BP 116/74 | HR 87 | Ht 69.0 in | Wt 238.0 lb

## 2021-05-02 DIAGNOSIS — N2889 Other specified disorders of kidney and ureter: Secondary | ICD-10-CM

## 2021-05-02 LAB — SURGICAL PATHOLOGY

## 2021-05-02 NOTE — Patient Instructions (Signed)
Kidney Cancer  Kidney cancer is an abnormal growth of cells in one or both kidneys. The kidneys filter waste from your blood and produce urine. Kidney cancer may spread to other parts of your body. This type of cancer may also be calledrenal cell carcinoma. What are the causes? The cause of this condition is not always known. In some cases, abnormal changes to genes (genetic mutations) can cause cells to form cancer. What increases the risk? You may be more likely to develop kidney cancer if you: Are over age 60. The risk increases with age. Have a family history of kidney cancer. Are of African-American, Native American, or Native [place] descent. Smoke. Are male. Are obese. Have high blood pressure (hypertension). Have advanced kidney disease, especially if you need long-term dialysis. Have certain conditions that are passed from parent to child (inherited), such as von Hippel-Lindau disease, tuberous sclerosis, or hereditary papillary renal carcinoma. Have been exposed to certain chemicals. What are the signs or symptoms? In the early stages, kidney cancer does not cause symptoms. As the cancer grows, symptoms may include: Blood in the urine. Pain in the upper back or abdomen, just below the rib cage. You may feel pain on one or both sides of the body. Fatigue. Unexplained weight loss. Fever. How is this diagnosed? This condition may be diagnosed based on: Your symptoms and medical history. A physical exam. Blood and urine tests. X-rays. Imaging tests, such as CT scans, MRIs, and PET scans. Having dye injected into your blood through an IV, and then having X-rays taken of: Your kidneys and the rest of the organs involved in making and storing urine (intravenous pyelogram). Your blood vessels (angiogram). Removal and testing of a kidney tissue sample (biopsy). Your cancer will be assessed (staged), based on how severe it is and how much it has spread. How is this  treated? Treatment depends on the type and stage of the cancer. Treatment may include one or more of the following: Surgery. This may include surgery to remove: Just the tumor (nephron-sparing surgery). The entire kidney (nephrectomy). The kidney, some of the surrounding healthy tissue, nearby lymph nodes, and the adrenal gland in certain cases (radical nephrectomy). Medicines that kill cancer cells (chemotherapy). High-energy rays that kill cancer cells (radiation therapy). Targeted therapy. This targets specific parts of cancer cells and the area around them to block the growth and the spread of the cancer. Targeted therapy can help to limit the damage to healthy cells. Medicines that help your body's disease-fighting system (immune system) fight cancer cells (immunotherapy). Freezing cancer cells using gas or liquid that is delivered through a needle (cryoablation). Destroying cancer cells using high-energy radio waves that are delivered through a needle-like probe (radiofrequency ablation). A procedure to block the artery that supplies blood to the tumor, which kills the cancer cells (embolization). Follow these instructions at home: Eating and drinking Some of your treatments might affect your appetite and your ability to chew and swallow. If you are having problems eating, or if you do not have an appetite, meet with a diet and nutrition specialist (dietitian). If you have side effects that affect eating, it may help to: Eat smaller meals and snacks often. Drink high-nutrition and high-calorie shakes or supplements. Eat bland and soft foods that are easy to eat. Not eat foods that are hot, spicy, or hard to swallow. Lifestyle Do not drink alcohol. Do not use any products that contain nicotine or tobacco, such as cigarettes and e-cigarettes. If you need help   quitting, ask your health care provider. General instructions  Take over-the-counter and prescription medicines only as told by  your health care provider. This includes vitamins, supplements, and herbal products. Consider joining a support group to help you cope with the stress of having kidney cancer. Work with your health care provider to manage any side effects of treatment. Keep all follow-up visits as told by your health care provider. This is important.  Where to find more information American Cancer Society: https://www.cancer.[place]): https://www.cancer.gov Contact a health care provider if you: Notice that you bruise or bleed easily. Are losing weight without trying. Have new or increased fatigue or weakness. Get help right away if you have: Blood in your urine. A sudden increase in pain. A fever. Shortness of breath. Chest pain. Yellow skin or whites of your eyes (jaundice). Summary Kidney cancer is an abnormal growth of cells (tumor) in one or both kidneys. Tumors may spread to other parts of your body. In the early stages, kidney cancer does not cause symptoms. As the cancer grows, symptoms may include blood in the urine, pain in the upper back or abdomen, unexplained weight loss, fatigue, and fever. Treatment depends on the type and stage of the cancer. It may include surgery to remove the tumor, procedures and medicines to kill the cancer cells, or medicines to help your body fight cancer cells. This information is not intended to replace advice given to you by your health care provider. Make sure you discuss any questions you have with your healthcare provider. Document Revised: 11/05/2017 Document Reviewed: 11/02/2017 Elsevier Patient Education  2022 [place].  Minimally Invasive Nephrectomy Minimally invasive nephrectomy is a surgical procedure to remove a kidney. This procedure is done through several small incisions in the abdomen. You may have surgery to: Remove the entire kidney and some surrounding structures (radical nephrectomy). Remove only the damaged or  diseased part of the kidney (partial nephrectomy). You may need this surgery if: Your kidney is severely damaged from disease, infection, or cancer. You were born with an abnormal kidney. You are donating a healthy kidney. Tell a health care provider about: Any allergies you have. All medicines you are taking, including vitamins, herbs, eye drops, creams, and over-the-counter medicines. Any problems you or family members have had with anesthetic medicines. Any blood disorders you have. Any surgeries you have had. Any medical conditions you have. Whether you are pregnant or may be pregnant. What are the risks? Generally, this is a safe procedure. However, problems may occur, including: Bleeding. Infection. Damage to other body structures near the kidney. Urine leaking into the abdomen. Pneumonia. A blood clot that forms in the leg and travels to the lung (pulmonary embolism). Allergic reactions to medicines. A bone (usually part of a rib) or more tissue may need to be removed so the surgeon can get to the kidney. If this happens, the minimally invasiveprocedure may need to be changed to an open procedure. What happens before the procedure? Staying hydrated Follow instructions from your health care provider about hydration, which may include: Up to 2 hours before the procedure - you may continue to drink clear liquids, such as water, clear fruit juice, black coffee, and plain tea.  Eating and drinking restrictions Follow instructions from your health care provider about eating and drinking, which may include: 8 hours before the procedure - stop eating heavy meals or foods, such as meat, fried foods, or fatty foods. 6 hours before the procedure - stop eating  light meals or foods, such as toast or cereal. 6 hours before the procedure - stop drinking milk or drinks that contain milk. 2 hours before the procedure - stop drinking clear liquids. Medicines Ask your health care provider  about: Changing or stopping your regular medicines. This is especially important if you are taking diabetes medicines or blood thinners. Taking medicines such as aspirin and ibuprofen. These medicines can thin your blood. Do not take these medicines unless your health care provider tells you to take them. Taking over-the-counter medicines, vitamins, herbs, and supplements. Surgery safety Ask your health care provider: How your surgery site will be marked. What steps will be taken to help prevent infection. These steps may include: Removing hair at the surgery site. Washing skin with a germ-killing soap. Taking antibiotic medicine. General instructions Do not use any products that contain nicotine or tobacco for at least 4 weeks before the procedure. These products include cigarettes, e-cigarettes, and chewing tobacco. If you need help quitting, ask your health care provider. Your health care provider may instruct you to do breathing exercises before the procedure to help prevent pneumonia. Do these exercises as directed. You may need to have your blood drawn (type and cross) in case you need to receive blood (get a transfusion) during the procedure. Plan to have a responsible adult take you home from the hospital or clinic. Plan to have a responsible adult care for you for the time you are told after you leave the hospital or clinic. This is important. What happens during the procedure?     Before surgery begins An IV will be inserted into one of your veins. You will be given a medicine to make you fall asleep (general anesthetic). Your health care provider may take steps to help blood flow in your legs. You may have: Tight stockings, also called compression stockings, on your legs. Compression sleeves wrapped around your legs. A machine called a sequential compression device (SCD) will pump air into the sleeves. A small, thin tube will be placed in your bladder (urinary catheter) to drain  urine. A tube will be placed through your nose and into your stomach (nasogastric tube) to drain stomach fluids. During surgery A small incision will be made in your abdomen to insert a thin, lighted tube (laparoscope) with a camera attached. This lets your surgeon see your kidney during the procedure. More small incisions will be made to insert other surgical tools. One incision may be slightly larger to remove your kidney. In some cases, the surgeon will do the procedure by controlling tools that are attached to a surgical robot. This is called a robot-assisted nephrectomy. The next steps depend on the type of procedure you are having. If you are having a partial nephrectomy: The blood vessels attached to your kidney will be clamped. Part of your kidney will be removed. The kidney will be closed with stitches (sutures), and the clamp on the blood vessels will be removed. If you are having a radical nephrectomy: All of the blood vessels that attach to your kidney will be closed and cut. Part of the tube that carries urine from your kidney to your bladder (ureter) will be removed. Your kidney will be removed. All of the surgical tools will be removed from your body. A small tube (drain) may be placed near one of the incisions to drain extra fluid from the surgery area. The incisions may be closed with sutures or another type of closure. A bandage (dressing) will be  placed over the incision area. The procedure may vary among health care providers and hospitals. What happens after the procedure? Your blood pressure, heart rate, breathing rate, and blood oxygen level will be monitored until you leave the hospital or clinic. Your nasogastric tube will be removed. You may continue to have: An IV until you can drink fluids on your own. A urinary catheter. Your urine output will be checked. A drain. Your surgical drain will be removed after a few days. Your health care provider will tell you how to  care for the drain at home. Pain medicine. This will be given through your IV. Compression stockings or compression sleeves.This helps to prevent blood clots and reduce swelling in your legs. You will be encouraged to: Get out of bed and walk as soon as possible. Do breathing exercises, such as coughing and breathing deeply. This helps prevent pneumonia. Summary Minimally invasive nephrectomy is a surgical procedure to remove a kidney. In some cases, only the damaged or diseased part of the kidney is removed. This procedure is done through several small incisions in the abdomen. Follow instructions from your health care provider about taking medicines and about eating and drinking before the procedure. You will be given a medicine to make you fall asleep (general anesthetic) during the procedure. This information is not intended to replace advice given to you by your health care provider. Make sure you discuss any questions you have with your healthcare provider. Document Revised: 02/07/2020 Document Reviewed: 02/07/2020 Elsevier Patient Education  2022 [employer].

## 2021-05-02 NOTE — Telephone Encounter (Signed)
Appt made

## 2021-05-02 NOTE — Progress Notes (Signed)
   05/02/2021 2:56 PM   Elvis Coil Jul 31, 1946 [id number]  Reason for visit: New left renal mass  HPI: I saw Mr. Bebo and his daughter in urology clinic for a new enhancing left renal mass seen on CT incidentally during work-up for appendicitis.  He underwent recent appendectomy on 04/27/2021, and has since been discharged and is doing well.  He has a history of prostate cancer treated with robotic prostatectomy in [place] in 2009, and in 2021 PSA remained undetectable and he opted to discontinue ongoing PSA surveillance.  His past surgical history is notable for robotic prostatectomy, recent appendectomy, as well as history of bilateral inguinal hernia repairs.  CT scan with contrast showed a 6 cm central left renal mass worrisome for malignancy, as well as with some extension versus lymph node involvement versus renal invasion at the hilum.  I personally viewed and interpreted the imaging.  There is no prior imaging to review.  Renal function is normal with creatinine of 1.01, EGFR greater than 60.  We discussed his CT findings at length, and the need for MRI for better delineation of the extent of the mass and involvement of possible hilum/renal vein.  We also reviewed the need for chest x-ray to complete staging.  We discussed the possible role of renal mass biopsy, as well as potential treatment options including radical nephrectomy, partial nephrectomy, active surveillance, or cryoablation.  With the large mass, he would not be a good candidate for surveillance, cryoablation, and the mass is much too central to consider partial nephrectomy.  I recommended considering a laparoscopic hand-assisted radical nephrectomy if he has not found to have significant renal vein invasion or any evidence of metastatic disease.  We discussed a 1 to 2-day hospitalization, need to avoid strenuous activity or heavy lifting for at least 6 weeks, need to monitor for recurrence and renal function long-term.  We  also discussed the possible role of adjuvant immunotherapy through oncology, and that final pathology and grade and stage will help elucidate the benefit of any possible adjuvant treatments.  Chest x-ray today to complete staging MRI abdomen for better evaluation of hilar involvement/renal vein invasion Call with above results-> anticipate laparoscopic hand-assisted radical nephrectomy in the near future  I spent 45 total minutes on the day of the encounter including pre-visit review of the medical record, face-to-face time with the patient, and post visit ordering of labs/imaging/tests.  Billey Co, [facility] [address] [place], [place] [address] [phone]

## 2021-05-04 ENCOUNTER — Telehealth: Payer: Self-pay

## 2021-05-04 ENCOUNTER — Encounter: Payer: Self-pay | Admitting: General Surgery

## 2021-05-04 DIAGNOSIS — R9389 Abnormal findings on diagnostic imaging of other specified body structures: Secondary | ICD-10-CM

## 2021-05-04 NOTE — Telephone Encounter (Signed)
Called pt informed him of the information below. Imaging ordered. Pt voiced understanding.

## 2021-05-04 NOTE — Telephone Encounter (Signed)
-----   Message from Billey Co, MD sent at 05/03/2021  9:40 AM EDT ----- Small spot on CXR that needs further evaluation with chest CT with contrast, please order and will call with results, thanks. Also needs MRI abdomen still which is already scheduled for 7/18 I believe  [name], MD 05/03/2021

## 2021-05-14 ENCOUNTER — Ambulatory Visit
Admission: RE | Admit: 2021-05-14 | Discharge: 2021-05-14 | Disposition: A | Discharge status: Home / Self Care | Payer: Medicare Other | Source: Ambulatory Visit | Attending: Urology | Admitting: Urology

## 2021-05-14 ENCOUNTER — Other Ambulatory Visit: Payer: Self-pay

## 2021-05-14 DIAGNOSIS — N2889 Other specified disorders of kidney and ureter: Secondary | ICD-10-CM | POA: Insufficient documentation

## 2021-05-14 MED ORDER — GADOBUTROL 1 MMOL/ML IV SOLN
10.0000 mL | Freq: Once | INTRAVENOUS | Status: AC | PRN
  Administered 2021-05-14: 10 mL via INTRAVENOUS

## 2021-05-17 ENCOUNTER — Ambulatory Visit (INDEPENDENT_AMBULATORY_CARE_PROVIDER_SITE_OTHER): Payer: Medicare Other | Admitting: Physician Assistant

## 2021-05-17 ENCOUNTER — Encounter: Payer: Self-pay | Admitting: Physician Assistant

## 2021-05-17 ENCOUNTER — Other Ambulatory Visit: Payer: Self-pay

## 2021-05-17 VITALS — BP 120/79 | HR 78 | Temp 98.4°F | Ht 69.0 in | Wt 242.0 lb

## 2021-05-17 DIAGNOSIS — Z09 Encounter for follow-up examination after completed treatment for conditions other than malignant neoplasm: Secondary | ICD-10-CM

## 2021-05-17 DIAGNOSIS — K353 Acute appendicitis with localized peritonitis, without perforation or gangrene: Secondary | ICD-10-CM

## 2021-05-17 NOTE — Patient Instructions (Signed)
If you have any concerns or questions, please feel free to call our office. Follow up as needed.   GENERAL POST-OPERATIVE PATIENT INSTRUCTIONS   WOUND CARE INSTRUCTIONS:  Keep a dry clean dressing on the wound if there is drainage. The initial bandage may be removed after 24 hours.  Once the wound has quit draining you may leave it open to air.  If clothing rubs against the wound or causes irritation and the wound is not draining you may cover it with a dry dressing during the daytime.  Try to keep the wound dry and avoid ointments on the wound unless directed to do so.  If the wound becomes bright red and painful or starts to drain infected material that is not clear, please contact your physician immediately.  If the wound is mildly pink and has a thick firm ridge underneath it, this is normal, and is referred to as a healing ridge.  This will resolve over the next 4-6 weeks.  BATHING: You may shower if you have been informed of this by your surgeon. However, Please do not submerge in a tub, hot tub, or pool until incisions are completely sealed or have been told by your surgeon that you may do so.  DIET:  You may eat any foods that you can tolerate.  It is a good idea to eat a high fiber diet and take in plenty of fluids to prevent constipation.  If you do become constipated you may want to take a mild laxative or take ducolax tablets on a daily basis until your bowel habits are regular.  Constipation can be very uncomfortable, along with straining, after recent surgery.  ACTIVITY:  You are encouraged to cough and deep breath or use your incentive spirometer if you were given one, every 15-30 minutes when awake.  This will help prevent respiratory complications and low grade fevers post-operatively if you had a general anesthetic.  You may want to hug a pillow when coughing and sneezing to add additional support to the surgical area, if you had abdominal or chest surgery, which will decrease pain  during these times.  You are encouraged to walk and engage in light activity for the next two weeks.  You should not lift more than 15 pounds, until 05/25/2021 as it could put you at increased risk for complications.  Twenty pounds is roughly equivalent to a plastic bag of groceries. At that time- Listen to your body when lifting, if you have pain when lifting, stop and then try again in a few days. Soreness after doing exercises or activities of daily living is normal as you get back in to your normal routine.  MEDICATIONS:  Try to take narcotic medications and anti-inflammatory medications, such as tylenol, ibuprofen, naprosyn, etc., with food.  This will minimize stomach upset from the medication.  Should you develop nausea and vomiting from the pain medication, or develop a rash, please discontinue the medication and contact your physician.  You should not drive, make important decisions, or operate machinery when taking narcotic pain medication.  SUNBLOCK Use sun block to incision area over the next year if this area will be exposed to sun. This helps decrease scarring and will allow you avoid a permanent darkened area over your incision.

## 2021-05-17 NOTE — Progress Notes (Signed)
[facility] POST-OP OFFICE VISIT  05/17/2021  HPI: Peter Stuart is a 75 y.o. male 20 days s/p laparoscopic appendectomy for acute appendicitis with Dr Celine Ahr, complicated by incidental finding of left renal mass concerning for carcinoma  Patient reports that he has done well since surgery  No complaints of abdominal pain, nausea, emesis, fever, chills He does endorse loose stool but states this has been present since his shoulder surgery earlier this year.  No issues with PO intake No issues with incisions  He was seen by Dr Diamantina Providence on 07/06 for his left renal mass. Underwent MRI on 07/18 which I have reviewed. Plan for CT Chest and then follow up with urology for POC determination.    Vital signs: BP 120/79   Pulse 78   Temp 98.4 F (36.9 C) (Oral)   Ht 5\' 9"  (1.753 m)   Wt 242 lb (109.8 kg)   SpO2 96%   BMI 35.74 kg/m    Physical Exam: Constitutional: Well appearing male, NAD. Daughter at bedside Abdomen: Soft, non-tender, non-distended, no rebound/guarding Skin: Laparoscopic incisions are well healed, no erythema or drainage  Assessment/Plan: This is a 75 y.o. male 20 days s/p laparoscopic appendectomy for acute appendicitis, complicated by incidental finding of left renal mass   - Pain control prn  - Reviewed wound care instructions  - Reviewed lifting restrictions; 4 weeks from date of surgery  - Follow up with urology as scheduled  - He can follow up with general surgery clinic on as needed basis   -- Edison Simon, PA-C [facility] 05/17/2021, 11:14 AM [phone] M-F: 7am - 4pm

## 2021-05-21 ENCOUNTER — Other Ambulatory Visit: Payer: Self-pay

## 2021-05-21 ENCOUNTER — Ambulatory Visit
Admission: RE | Admit: 2021-05-21 | Discharge: 2021-05-21 | Disposition: A | Discharge status: Home / Self Care | Payer: Medicare Other | Source: Ambulatory Visit | Attending: Urology | Admitting: Urology

## 2021-05-21 DIAGNOSIS — R9389 Abnormal findings on diagnostic imaging of other specified body structures: Secondary | ICD-10-CM | POA: Diagnosis not present

## 2021-05-21 MED ORDER — IOHEXOL 350 MG/ML SOLN
75.0000 mL | Freq: Once | INTRAVENOUS | Status: AC | PRN
  Administered 2021-05-21: 75 mL via INTRAVENOUS

## 2021-05-23 ENCOUNTER — Other Ambulatory Visit: Payer: Self-pay | Admitting: Urology

## 2021-05-23 DIAGNOSIS — N2889 Other specified disorders of kidney and ureter: Secondary | ICD-10-CM

## 2021-05-25 ENCOUNTER — Telehealth: Payer: Self-pay

## 2021-05-25 NOTE — Telephone Encounter (Signed)
Spoke with patient's daughter, surgery for Left Nephrectomy w/Dr. Diamantina Providence has been rescheduled per her request from 06/01/21 to 06/25/21.

## 2021-05-28 ENCOUNTER — Inpatient Hospital Stay: Admission: RE | Admit: 2021-05-28 | Payer: Medicare Other | Source: Ambulatory Visit

## 2021-06-14 ENCOUNTER — Encounter
Admission: RE | Admit: 2021-06-14 | Discharge: 2021-06-14 | Disposition: A | Discharge status: Home / Self Care | Payer: Medicare Other | Source: Ambulatory Visit | Attending: Urology | Admitting: Urology

## 2021-06-14 ENCOUNTER — Other Ambulatory Visit: Payer: Self-pay

## 2021-06-14 HISTORY — DX: Anemia, unspecified: D64.9

## 2021-06-14 NOTE — Patient Instructions (Signed)
Your procedure is scheduled on: 06/25/21 Report to [facility]. To find out your arrival time please call [phone] between 1PM - 3PM on 06/22/21.  Remember: Instructions that are not followed completely may result in serious medical risk, up to and including death, or upon the discretion of your surgeon and anesthesiologist your surgery may need to be rescheduled.     _X__ 1. Do not eat food or drink any liquids after midnight the night before your procedure.                 No gum chewing or hard candies.   __X__2.  On the morning of surgery brush your teeth with toothpaste and water, you                 may rinse your mouth with mouthwash if you wish.  Do not swallow any              toothpaste of mouthwash.     _X__ 3.  No Alcohol for 24 hours before or after surgery.   _X__ 4.  Do Not Smoke or use e-cigarettes For 24 Hours Prior to Your Surgery.                 Do not use any chewable tobacco products for at least 6 hours prior to                 surgery.  ____  5.  Bring all medications with you on the day of surgery if instructed.   __X__  6.  Notify your doctor if there is any change in your medical condition      (cold, fever, infections).     Do not wear jewelry, make-up, hairpins, clips or nail polish. Do not wear lotions, powders, or perfumes.  Do not shave body hair 48 hours prior to surgery. Men may shave face and neck. Do not bring valuables to the hospital.    [facility] is not responsible for any belongings or valuables.  Contacts, dentures/partials or body piercings may not be worn into surgery. Bring a case for your contacts, glasses or hearing aids, a denture cup will be supplied. Leave your suitcase in the car. After surgery it may be brought to your room. For patients admitted to the hospital, discharge time is determined by your treatment team.   Patients discharged the day of surgery will not be  allowed to drive home.   Please read over the following fact sheets that you were given:   MRSA Information, CHG soap  __X__ Take these medicines the morning of surgery with A SIP OF WATER:    1. ALLEGRA ALLERGY 60 MG tablet  2. ezetimibe (ZETIA) 10 MG tablet  3. FLONASE IF NEEDED  4.  5.  6.  ____ Fleet Enema (as directed)   __X__ Use CHG Soap/SAGE wipes as directed  ____ Use inhalers on the day of surgery  ____ Stop metformin/Janumet/Farxiga 2 days prior to surgery    ____ Take 1/2 of usual insulin dose the night before surgery. No insulin the morning          of surgery.   ____ Stop Blood Thinners Coumadin/Plavix/Xarelto/Pleta/Pradaxa/Eliquis/Effient/Aspirin  on   Or contact your Surgeon, Cardiologist or Medical Doctor regarding  ability to stop your blood thinners  __X__ Stop Anti-inflammatories 7 days before surgery such as Advil, Ibuprofen, Motrin,  BC or Goodies Powder, Naprosyn, Naproxen, Aleve,  Aspirin   You may take Tylenol id needed  __X__ Stop all herbal supplements, fish oil or vitamin E until after surgery.STOP MULTI VITAMIN, OCUVITE AND CO-Q10 1 WEEK PRIOR TO SURGERY  ____ Bring C-Pap to the hospital.

## 2021-06-18 ENCOUNTER — Other Ambulatory Visit: Payer: Self-pay

## 2021-06-18 ENCOUNTER — Other Ambulatory Visit
Admission: RE | Admit: 2021-06-18 | Discharge: 2021-06-18 | Disposition: A | Discharge status: Home / Self Care | Payer: Medicare Other | Source: Ambulatory Visit | Attending: Urology | Admitting: Urology

## 2021-06-18 ENCOUNTER — Encounter: Payer: Self-pay | Admitting: Urgent Care

## 2021-06-18 DIAGNOSIS — Z01818 Encounter for other preprocedural examination: Secondary | ICD-10-CM | POA: Diagnosis not present

## 2021-06-18 LAB — TYPE AND SCREEN
ABO/RH(D): B POS
Antibody Screen: NEGATIVE

## 2021-06-21 ENCOUNTER — Other Ambulatory Visit
Admission: RE | Admit: 2021-06-21 | Discharge: 2021-06-21 | Disposition: A | Discharge status: Home / Self Care | Payer: Medicare Other | Source: Ambulatory Visit | Attending: Urology | Admitting: Urology

## 2021-06-21 ENCOUNTER — Other Ambulatory Visit: Payer: Self-pay

## 2021-06-21 DIAGNOSIS — Z20822 Contact with and (suspected) exposure to covid-19: Secondary | ICD-10-CM | POA: Insufficient documentation

## 2021-06-21 DIAGNOSIS — Z01812 Encounter for preprocedural laboratory examination: Secondary | ICD-10-CM | POA: Insufficient documentation

## 2021-06-22 LAB — SARS CORONAVIRUS 2 (TAT 6-24 HRS): SARS Coronavirus 2: NEGATIVE

## 2021-06-25 ENCOUNTER — Encounter
Admission: RE | Disposition: A | Discharge status: Home Health | Payer: Self-pay | Source: Home / Self Care | Attending: Urology

## 2021-06-25 ENCOUNTER — Other Ambulatory Visit: Payer: Self-pay

## 2021-06-25 ENCOUNTER — Inpatient Hospital Stay: Payer: Medicare Other | Admitting: Anesthesiology

## 2021-06-25 ENCOUNTER — Inpatient Hospital Stay
Admission: RE | Admit: 2021-06-25 | Discharge: 2021-06-27 | DRG: 658 | Disposition: A | Discharge status: Home Health | Payer: Medicare Other | Attending: Urology | Admitting: Urology

## 2021-06-25 ENCOUNTER — Encounter: Payer: Self-pay | Admitting: Urology

## 2021-06-25 DIAGNOSIS — Z823 Family history of stroke: Secondary | ICD-10-CM | POA: Diagnosis not present

## 2021-06-25 DIAGNOSIS — Z9079 Acquired absence of other genital organ(s): Secondary | ICD-10-CM | POA: Diagnosis not present

## 2021-06-25 DIAGNOSIS — Z825 Family history of asthma and other chronic lower respiratory diseases: Secondary | ICD-10-CM

## 2021-06-25 DIAGNOSIS — I10 Essential (primary) hypertension: Secondary | ICD-10-CM | POA: Diagnosis present

## 2021-06-25 DIAGNOSIS — C642 Malignant neoplasm of left kidney, except renal pelvis: Principal | ICD-10-CM | POA: Diagnosis present

## 2021-06-25 DIAGNOSIS — N2889 Other specified disorders of kidney and ureter: Secondary | ICD-10-CM

## 2021-06-25 DIAGNOSIS — Z8546 Personal history of malignant neoplasm of prostate: Secondary | ICD-10-CM

## 2021-06-25 DIAGNOSIS — Z8249 Family history of ischemic heart disease and other diseases of the circulatory system: Secondary | ICD-10-CM | POA: Diagnosis not present

## 2021-06-25 DIAGNOSIS — G4733 Obstructive sleep apnea (adult) (pediatric): Secondary | ICD-10-CM | POA: Diagnosis present

## 2021-06-25 DIAGNOSIS — Z888 Allergy status to other drugs, medicaments and biological substances status: Secondary | ICD-10-CM | POA: Diagnosis not present

## 2021-06-25 HISTORY — PX: LAPAROSCOPIC NEPHRECTOMY, HAND ASSISTED: SHX1929

## 2021-06-25 LAB — ABO/RH: ABO/RH(D): B POS

## 2021-06-25 SURGERY — NEPHRECTOMY, HAND-ASSISTED, LAPAROSCOPIC
Anesthesia: General | Site: Abdomen | Laterality: Left

## 2021-06-25 MED ORDER — PHENYLEPHRINE HCL (PRESSORS) 10 MG/ML IV SOLN
INTRAVENOUS | Status: DC | PRN
  Administered 2021-06-25 (×2): 100 ug via INTRAVENOUS

## 2021-06-25 MED ORDER — HEMOSTATIC AGENTS (NO CHARGE) OPTIME
TOPICAL | Status: DC | PRN
  Administered 2021-06-25: 1 via TOPICAL

## 2021-06-25 MED ORDER — ROCURONIUM BROMIDE 100 MG/10ML IV SOLN
INTRAVENOUS | Status: DC | PRN
  Administered 2021-06-25: 20 mg via INTRAVENOUS
  Administered 2021-06-25: 10 mg via INTRAVENOUS
  Administered 2021-06-25: 70 mg via INTRAVENOUS

## 2021-06-25 MED ORDER — HYDROCODONE-ACETAMINOPHEN 5-325 MG PO TABS
1.0000 | ORAL_TABLET | ORAL | Status: DC | PRN
  Administered 2021-06-25 – 2021-06-27 (×4): 2 via ORAL
  Filled 2021-06-25 (×4): qty 2
  Filled 2021-06-25: qty 1

## 2021-06-25 MED ORDER — BUPIVACAINE HCL (PF) 0.5 % IJ SOLN
INTRAMUSCULAR | Status: AC
  Filled 2021-06-25: qty 30

## 2021-06-25 MED ORDER — HEPARIN SODIUM (PORCINE) 1000 UNIT/ML IJ SOLN
INTRAMUSCULAR | Status: DC | PRN
  Administered 2021-06-25: 5000 [IU] via INTRAVENOUS

## 2021-06-25 MED ORDER — STERILE WATER FOR IRRIGATION IR SOLN
Status: DC | PRN
  Administered 2021-06-25: 3000 mL

## 2021-06-25 MED ORDER — OXYCODONE HCL 5 MG/5ML PO SOLN: 5.0000 mg | Freq: Once | ORAL | Status: DC | PRN

## 2021-06-25 MED ORDER — THROMBIN 5000 UNITS EX SOLR
CUTANEOUS | Status: DC | PRN
  Administered 2021-06-25: 5000 [IU] via TOPICAL

## 2021-06-25 MED ORDER — ONDANSETRON HCL 4 MG/2ML IJ SOLN
INTRAMUSCULAR | Status: DC | PRN
  Administered 2021-06-25: 4 mg via INTRAVENOUS

## 2021-06-25 MED ORDER — MEPERIDINE HCL 25 MG/ML IJ SOLN: 6.2500 mg | INTRAMUSCULAR | Status: DC | PRN

## 2021-06-25 MED ORDER — KETAMINE HCL 50 MG/5ML IJ SOSY
PREFILLED_SYRINGE | INTRAMUSCULAR | Status: AC
  Filled 2021-06-25: qty 5

## 2021-06-25 MED ORDER — BUPIVACAINE LIPOSOME 1.3 % IJ SUSP
INTRAMUSCULAR | Status: DC | PRN
  Administered 2021-06-25: 50 mL

## 2021-06-25 MED ORDER — HEPARIN SODIUM (PORCINE) 5000 UNIT/ML IJ SOLN
INTRAMUSCULAR | Status: AC
  Filled 2021-06-25: qty 1

## 2021-06-25 MED ORDER — FENTANYL CITRATE (PF) 100 MCG/2ML IJ SOLN
INTRAMUSCULAR | Status: AC
  Filled 2021-06-25: qty 2

## 2021-06-25 MED ORDER — SODIUM CHLORIDE 0.45 % IV SOLN
INTRAVENOUS | Status: DC
Start: 2021-06-25 — End: 2021-06-27

## 2021-06-25 MED ORDER — LIDOCAINE HCL (PF) 2 % IJ SOLN
INTRAMUSCULAR | Status: AC
  Filled 2021-06-25: qty 5

## 2021-06-25 MED ORDER — DEXMEDETOMIDINE (PRECEDEX) IN NS 20 MCG/5ML (4 MCG/ML) IV SYRINGE
PREFILLED_SYRINGE | INTRAVENOUS | Status: AC
  Filled 2021-06-25: qty 5

## 2021-06-25 MED ORDER — LACTATED RINGERS IV SOLN: INTRAVENOUS | Status: DC

## 2021-06-25 MED ORDER — ORAL CARE MOUTH RINSE: 15.0000 mL | Freq: Once | OROMUCOSAL | Status: AC

## 2021-06-25 MED ORDER — DOCUSATE SODIUM 100 MG PO CAPS
100.0000 mg | ORAL_CAPSULE | Freq: Two times a day (BID) | ORAL | Status: DC
  Administered 2021-06-25 – 2021-06-27 (×4): 100 mg via ORAL
  Filled 2021-06-25 (×4): qty 1

## 2021-06-25 MED ORDER — ACETAMINOPHEN 10 MG/ML IV SOLN
INTRAVENOUS | Status: DC | PRN
  Administered 2021-06-25: 1000 mg via INTRAVENOUS

## 2021-06-25 MED ORDER — FAMOTIDINE 20 MG PO TABS
ORAL_TABLET | ORAL | Status: AC
  Administered 2021-06-25: 20 mg via ORAL
  Filled 2021-06-25: qty 1

## 2021-06-25 MED ORDER — PHENYLEPHRINE HCL (PRESSORS) 10 MG/ML IV SOLN
INTRAVENOUS | Status: AC
  Filled 2021-06-25: qty 1

## 2021-06-25 MED ORDER — BUPIVACAINE LIPOSOME 1.3 % IJ SUSP
INTRAMUSCULAR | Status: AC
  Filled 2021-06-25: qty 20

## 2021-06-25 MED ORDER — LIDOCAINE HCL (CARDIAC) PF 100 MG/5ML IV SOSY
PREFILLED_SYRINGE | INTRAVENOUS | Status: DC | PRN
  Administered 2021-06-25: 100 mg via INTRAVENOUS

## 2021-06-25 MED ORDER — PROPOFOL 10 MG/ML IV BOLUS
INTRAVENOUS | Status: AC
  Filled 2021-06-25: qty 20

## 2021-06-25 MED ORDER — DEXAMETHASONE SODIUM PHOSPHATE 10 MG/ML IJ SOLN
INTRAMUSCULAR | Status: DC | PRN
  Administered 2021-06-25: 10 mg via INTRAVENOUS

## 2021-06-25 MED ORDER — PROMETHAZINE HCL 25 MG/ML IJ SOLN: 6.2500 mg | INTRAMUSCULAR | Status: DC | PRN

## 2021-06-25 MED ORDER — SEVOFLURANE IN SOLN
RESPIRATORY_TRACT | Status: AC
  Filled 2021-06-25: qty 250

## 2021-06-25 MED ORDER — DEXAMETHASONE SODIUM PHOSPHATE 10 MG/ML IJ SOLN
INTRAMUSCULAR | Status: AC
  Filled 2021-06-25: qty 1

## 2021-06-25 MED ORDER — CHLORHEXIDINE GLUCONATE 0.12 % MT SOLN
OROMUCOSAL | Status: AC
  Administered 2021-06-25: 15 mL via OROMUCOSAL
  Filled 2021-06-25: qty 15

## 2021-06-25 MED ORDER — 0.9 % SODIUM CHLORIDE (POUR BTL) OPTIME
TOPICAL | Status: DC | PRN
  Administered 2021-06-25: 500 mL

## 2021-06-25 MED ORDER — FENTANYL CITRATE (PF) 100 MCG/2ML IJ SOLN
25.0000 ug | INTRAMUSCULAR | Status: DC | PRN
  Administered 2021-06-25: 25 ug via INTRAVENOUS
  Administered 2021-06-25: 50 ug via INTRAVENOUS

## 2021-06-25 MED ORDER — SUGAMMADEX SODIUM 500 MG/5ML IV SOLN
INTRAVENOUS | Status: DC | PRN
  Administered 2021-06-25: 250 mg via INTRAVENOUS

## 2021-06-25 MED ORDER — HEPARIN SODIUM (PORCINE) 5000 UNIT/ML IJ SOLN
5000.0000 [IU] | Freq: Three times a day (TID) | INTRAMUSCULAR | Status: DC
  Administered 2021-06-25 – 2021-06-27 (×6): 5000 [IU] via SUBCUTANEOUS
  Filled 2021-06-25 (×6): qty 1

## 2021-06-25 MED ORDER — CEFAZOLIN SODIUM-DEXTROSE 2-4 GM/100ML-% IV SOLN
INTRAVENOUS | Status: AC
  Filled 2021-06-25: qty 100

## 2021-06-25 MED ORDER — THROMBIN 5000 UNITS EX SOLR
CUTANEOUS | Status: AC
  Filled 2021-06-25: qty 5000

## 2021-06-25 MED ORDER — ONDANSETRON HCL 4 MG/2ML IJ SOLN
INTRAMUSCULAR | Status: AC
  Filled 2021-06-25: qty 2

## 2021-06-25 MED ORDER — CHLORHEXIDINE GLUCONATE 0.12 % MT SOLN: 15.0000 mL | Freq: Once | OROMUCOSAL | Status: AC

## 2021-06-25 MED ORDER — MIDAZOLAM HCL 2 MG/2ML IJ SOLN
INTRAMUSCULAR | Status: AC
  Filled 2021-06-25: qty 2

## 2021-06-25 MED ORDER — ONDANSETRON HCL 4 MG/2ML IJ SOLN: 4.0000 mg | INTRAMUSCULAR | Status: DC | PRN

## 2021-06-25 MED ORDER — DEXMEDETOMIDINE (PRECEDEX) IN NS 20 MCG/5ML (4 MCG/ML) IV SYRINGE
PREFILLED_SYRINGE | INTRAVENOUS | Status: DC | PRN
  Administered 2021-06-25: 8 ug via INTRAVENOUS
  Administered 2021-06-25: 4 ug via INTRAVENOUS
  Administered 2021-06-25: 8 ug via INTRAVENOUS

## 2021-06-25 MED ORDER — FENTANYL CITRATE (PF) 100 MCG/2ML IJ SOLN
INTRAMUSCULAR | Status: DC | PRN
  Administered 2021-06-25 (×2): 25 ug via INTRAVENOUS
  Administered 2021-06-25: 50 ug via INTRAVENOUS

## 2021-06-25 MED ORDER — FAMOTIDINE 20 MG PO TABS: 20.0000 mg | ORAL_TABLET | Freq: Once | ORAL | Status: AC

## 2021-06-25 MED ORDER — LOSARTAN POTASSIUM 50 MG PO TABS
100.0000 mg | ORAL_TABLET | Freq: Every day | ORAL | Status: DC
  Administered 2021-06-25 – 2021-06-27 (×3): 100 mg via ORAL
  Filled 2021-06-25 (×3): qty 2

## 2021-06-25 MED ORDER — EZETIMIBE 10 MG PO TABS
10.0000 mg | ORAL_TABLET | ORAL | Status: DC
  Administered 2021-06-27: 10 mg via ORAL
  Filled 2021-06-25 (×2): qty 1

## 2021-06-25 MED ORDER — ACETAMINOPHEN 10 MG/ML IV SOLN
INTRAVENOUS | Status: AC
  Filled 2021-06-25: qty 100

## 2021-06-25 MED ORDER — HYDROCHLOROTHIAZIDE 25 MG PO TABS
25.0000 mg | ORAL_TABLET | Freq: Every day | ORAL | Status: DC
  Administered 2021-06-25 – 2021-06-27 (×3): 25 mg via ORAL
  Filled 2021-06-25 (×3): qty 1

## 2021-06-25 MED ORDER — ACETAMINOPHEN 325 MG PO TABS
650.0000 mg | ORAL_TABLET | ORAL | Status: DC | PRN
  Administered 2021-06-27: 650 mg via ORAL
  Filled 2021-06-25: qty 2

## 2021-06-25 MED ORDER — OXYCODONE HCL 5 MG PO TABS
5.0000 mg | ORAL_TABLET | Freq: Once | ORAL | Status: DC | PRN
Start: 2021-06-25 — End: 2021-06-25

## 2021-06-25 MED ORDER — KETAMINE HCL 10 MG/ML IJ SOLN
INTRAMUSCULAR | Status: DC | PRN
  Administered 2021-06-25: 30 mg via INTRAVENOUS

## 2021-06-25 MED ORDER — ROCURONIUM BROMIDE 10 MG/ML (PF) SYRINGE
PREFILLED_SYRINGE | INTRAVENOUS | Status: AC
  Filled 2021-06-25: qty 10

## 2021-06-25 MED ORDER — CEFAZOLIN SODIUM-DEXTROSE 2-4 GM/100ML-% IV SOLN
2.0000 g | INTRAVENOUS | Status: AC
  Administered 2021-06-25: 2 g via INTRAVENOUS

## 2021-06-25 MED ORDER — PROPOFOL 10 MG/ML IV BOLUS
INTRAVENOUS | Status: DC | PRN
  Administered 2021-06-25: 150 mg via INTRAVENOUS
  Administered 2021-06-25: 70 mg via INTRAVENOUS

## 2021-06-25 SURGICAL SUPPLY — 88 items
ADH SKN CLS APL DERMABOND .7 (GAUZE/BANDAGES/DRESSINGS) ×2
AGENT HMST MTR 8 SURGIFLO (HEMOSTASIS) ×1
APL ESCP 34 STRL LF DISP (HEMOSTASIS) ×1
APL PRP STRL LF DISP 70% ISPRP (MISCELLANEOUS) ×1
APPLICATOR SURGIFLO ENDO (HEMOSTASIS) ×2 IMPLANT
APPLIER CLIP ROT 10 11.4 M/L (STAPLE)
APPLIER CLIP ROT 13.4 12 LRG (CLIP)
APR CLP LRG 13.4X12 ROT 20 MLT (CLIP)
APR CLP MED LRG 11.4X10 (STAPLE)
BAG LAPAROSCOPIC 12 15 PORT 16 (BASKET) ×1 IMPLANT
BAG RETRIEVAL 12/15 (BASKET) ×2
CANISTER SUCT 1200ML W/VALVE (MISCELLANEOUS) IMPLANT
CHLORAPREP W/TINT 26 (MISCELLANEOUS) ×2 IMPLANT
CLEANER CAUTERY TIP 5X5 PAD (MISCELLANEOUS) ×1 IMPLANT
CLIP APPLIE ROT 10 11.4 M/L (STAPLE) IMPLANT
CLIP APPLIE ROT 13.4 12 LRG (CLIP) IMPLANT
CLIP VESOLOCK LG 6/CT PURPLE (CLIP) ×4 IMPLANT
CUTTER ECHEON FLEX ENDO 45 340 (ENDOMECHANICALS) IMPLANT
DEFOGGER SCOPE WARMER CLEARIFY (MISCELLANEOUS) ×2 IMPLANT
DERMABOND ADVANCED (GAUZE/BANDAGES/DRESSINGS) ×2
DERMABOND ADVANCED .7 DNX12 (GAUZE/BANDAGES/DRESSINGS) ×2 IMPLANT
DRAPE INCISE IOBAN 66X45 STRL (DRAPES) ×2 IMPLANT
DRAPE SURG 17X11 SM STRL (DRAPES) ×8 IMPLANT
DRSG TELFA 3X8 NADH (GAUZE/BANDAGES/DRESSINGS) IMPLANT
ELECT REM PT RETURN 9FT ADLT (ELECTROSURGICAL) ×2
ELECTRODE REM PT RTRN 9FT ADLT (ELECTROSURGICAL) ×1 IMPLANT
GAUZE 4X4 16PLY ~~LOC~~+RFID DBL (SPONGE) ×2 IMPLANT
GLOVE SURG ENC MOIS LTX SZ6.5 (GLOVE) ×4 IMPLANT
GLOVE SURG UNDER POLY LF SZ7.5 (GLOVE) ×6 IMPLANT
GOWN STRL REUS W/ TWL LRG LVL3 (GOWN DISPOSABLE) ×2 IMPLANT
GOWN STRL REUS W/ TWL XL LVL3 (GOWN DISPOSABLE) ×1 IMPLANT
GOWN STRL REUS W/TWL LRG LVL3 (GOWN DISPOSABLE) ×4
GOWN STRL REUS W/TWL XL LVL3 (GOWN DISPOSABLE) ×2
GRASPER SUT TROCAR 14GX15 (MISCELLANEOUS) ×2 IMPLANT
HANDLE YANKAUER SUCT BULB TIP (MISCELLANEOUS) IMPLANT
HEMOSTAT SURGICEL 2X14 (HEMOSTASIS) ×2 IMPLANT
HOLDER FOLEY CATH W/STRAP (MISCELLANEOUS) ×2 IMPLANT
IRRIGATION STRYKERFLOW (MISCELLANEOUS) ×1 IMPLANT
IRRIGATOR STRYKERFLOW (MISCELLANEOUS) ×2
KIT PINK PAD W/HEAD ARE REST (MISCELLANEOUS) ×2
KIT PINK PAD W/HEAD ARM REST (MISCELLANEOUS) ×1 IMPLANT
KIT TURNOVER KIT A (KITS) ×2 IMPLANT
KITTNER LAPARASCOPIC 5X40 (MISCELLANEOUS) ×2 IMPLANT
L-HOOK LAP DISP 36CM (ELECTROSURGICAL) ×2
LABEL OR SOLS (LABEL) ×2 IMPLANT
LHOOK LAP DISP 36CM (ELECTROSURGICAL) ×1 IMPLANT
LIGASURE LAP ATLAS 10MM 37CM (INSTRUMENTS) ×2 IMPLANT
LOOP RED MAXI  1X406MM (MISCELLANEOUS) ×1
LOOP VESSEL MAXI 1X406 RED (MISCELLANEOUS) ×1 IMPLANT
MANIFOLD NEPTUNE II (INSTRUMENTS) ×2 IMPLANT
NEEDLE HYPO 21X1.5 SAFETY (NEEDLE) ×2 IMPLANT
NEEDLE HYPO 22GX1.5 SAFETY (NEEDLE) ×2 IMPLANT
PACK LAP CHOLECYSTECTOMY (MISCELLANEOUS) ×2 IMPLANT
PAD CLEANER CAUTERY TIP 5X5 (MISCELLANEOUS) ×1
PENCIL ELECTRO HAND CTR (MISCELLANEOUS) ×2 IMPLANT
RELOAD STAPLER WHITE 60MM (STAPLE) ×2 IMPLANT
SCISSORS METZENBAUM CVD 33 (INSTRUMENTS) IMPLANT
SET TUBE SMOKE EVAC HIGH FLOW (TUBING) ×2 IMPLANT
SPOGE SURGIFLO 8M (HEMOSTASIS) ×1
SPONGE KITTNER 5P (MISCELLANEOUS) IMPLANT
SPONGE SURGIFLO 8M (HEMOSTASIS) ×1 IMPLANT
SPONGE T-LAP 18X18 ~~LOC~~+RFID (SPONGE) ×2 IMPLANT
STAPLE RELOAD 2.5MM WHITE (STAPLE) IMPLANT
STAPLE RELOAD 45 WHT (STAPLE) IMPLANT
STAPLE RELOAD 45MM WHITE (STAPLE)
STAPLER ECHELON LONG 60 440 (INSTRUMENTS) ×2 IMPLANT
STAPLER RELOAD WHITE 60MM (STAPLE) ×4
STAPLER SKIN PROX 35W (STAPLE) ×2 IMPLANT
STAPLER VASCULAR ECHELON 35 (CUTTER) IMPLANT
SURGILUBE 2OZ TUBE FLIPTOP (MISCELLANEOUS) ×2 IMPLANT
SUT MNCRL AB 4-0 PS2 18 (SUTURE) ×4 IMPLANT
SUT PDS AB 1 TP1 96 (SUTURE) ×4 IMPLANT
SUT VIC AB 0 CT1 36 (SUTURE) IMPLANT
SUT VIC AB 1 CT1 36 (SUTURE) ×2 IMPLANT
SUT VIC AB 2-0 SH 27 (SUTURE) ×4
SUT VIC AB 2-0 SH 27XBRD (SUTURE) ×2 IMPLANT
SUT VIC AB 2-0 UR6 27 (SUTURE) IMPLANT
SUT VIC AB 4-0 FS2 27 (SUTURE) IMPLANT
SUT VICRYL 0 AB UR-6 (SUTURE) ×4 IMPLANT
SYR 20ML LL LF (SYRINGE) ×2 IMPLANT
SYS LAPSCP GELPORT 120MM (MISCELLANEOUS) ×2
SYSTEM LAPSCP GELPORT 120MM (MISCELLANEOUS) ×1 IMPLANT
TRAY FOLEY MTR SLVR 16FR STAT (SET/KITS/TRAYS/PACK) ×2 IMPLANT
TROCAR ENDOPATH XCEL 12X100 BL (ENDOMECHANICALS) ×2 IMPLANT
TROCAR XCEL 12X100 BLDLESS (ENDOMECHANICALS) ×2 IMPLANT
WATER STERILE IRR 1000ML POUR (IV SOLUTION) IMPLANT
WATER STERILE IRR 3000ML UROMA (IV SOLUTION) ×2 IMPLANT
WATER STERILE IRR 500ML POUR (IV SOLUTION) IMPLANT

## 2021-06-25 NOTE — Transfer of Care (Signed)
Immediate Anesthesia Transfer of Care Note  Patient: Peter Stuart  Procedure(s) Performed: HAND ASSISTED LAPAROSCOPIC NEPHRECTOMY (Left: Abdomen)  Patient Location: PACU  Anesthesia Type:General  Level of Consciousness: drowsy  Airway & Oxygen Therapy: Patient Spontanous Breathing and Patient connected to face mask oxygen  Post-op Assessment: Report given to RN and Post -op Vital signs reviewed and stable  Post vital signs: Reviewed  Last Vitals:  Vitals Value Taken Time  BP 127/80 06/25/21 1200  Temp 36.8 C 06/25/21 1156  Pulse 64 06/25/21 1200  Resp 23 06/25/21 1200  SpO2 95 % 06/25/21 1200  Vitals shown include unvalidated device data.  Last Pain:  Vitals:   06/25/21 1156  TempSrc:   PainSc: Asleep         Complications: No notable events documented.

## 2021-06-25 NOTE — Op Note (Addendum)
Date of procedure: 06/25/21  Preoperative diagnosis:  Left renal mass  Postoperative diagnosis:  Same  Procedure: Left hand-assisted laparoscopic radical nephrectomy  Surgeon: [name], MD  Assistant: Hollice Espy, MD  (An assistant was required for this surgical procedure.  The duties of the assistant included but were not limited to suctioning, passing suture, camera manipulation, retraction. This procedure would not be able to be performed without an assistant)  Anesthesia: General  Complications: None  Intraoperative findings:  Uncomplicated left radical nephrectomy  EBL: 60 mL  Specimens: Left kidney and proximal ureter  Drains: 16 French Foley  Indication: Peter Stuart is a 75 y.o. patient with 8 cm enhancing left renal mass worrisome for RCC who opted for radical nephrectomy.  After reviewing the management options for treatment, they elected to proceed with the above surgical procedure(s). We have discussed the potential benefits and risks of the procedure, side effects of the proposed treatment, the likelihood of the patient achieving the goals of the procedure, and any potential problems that might occur during the procedure or recuperation. Informed consent has been obtained.  Description of procedure:  The patient was taken to the operating room and general anesthesia was induced. SCDs were placed for DVT prophylaxis, and 5000 units of subcutaneous heparin were given. The patient was placed in the modified lateral position with the left side up, carefully padded and secured, prepped and draped in the usual sterile fashion, and preoperative antibiotics(Ancef) were administered. A preoperative time-out was performed.   A Foley was placed easily on the field, and 10 mL placed in the balloon.  An 8 cm midline incision was made between the sternum and umbilicus and dissected down to fascia.  The fascia was opened sharply, as well as the peritoneum.  The  hand-assisted port was inserted, and the abdomen was insufflated.  There were no significant adhesions on the left side.  Under direct vision, 2 working 12 mm ports were placed in the left lower quadrant, and preclosed using the Carter-Thomason device.  A combination of the LigaSure, hook cautery, and suction were used to take down the left colon and this was reflected to reveal the left kidney and retroperitoneum.  The left ureter was identified and held up and this was followed proximally towards the hilum.  The renal artery and vein were clearly identified, and a 60 mm vascular staple load was used to take the hilum.  There was excellent hemostasis.  I then used the LigaSure to work laterally and freed the kidney from the sidewall.  An additional vascular load was used to free the kidney at the superior medial aspect, and the LigaSure was used to take the final attachments superiorly.  Weck clips were used to divide the ureter.  The kidney was placed in a bag and removed through the HandPort.  The retroperitoneum was irrigated copiously with water and there was excellent hemostasis.  Surgicel and Surgi-Flo were placed at the base of the spleen.   Exparel was injected into the midline incision and port sites.  The port site incisions were tied down, and a running 1-0 PDS loop suture was used to close the fascia of the midline incision.  The midline wound was again irrigated with water, and the skin and port sites were closed with running Monocryl and Dermabond.  Disposition: Stable to PACU  Plan: Follow-up pathology, admission overnight  [name], MD

## 2021-06-25 NOTE — H&P (Signed)
   06/25/21 8:52 AM   Elvis Coil [birth year] [id number]  CC: Left renal mass  HPI: He has a history of prostate cancer treated with robotic prostatectomy in [place] in 2009, and in 2021 PSA remained undetectable and he opted to discontinue ongoing PSA surveillance.  His past surgical history is notable for robotic prostatectomy, recent appendectomy, as well as history of bilateral inguinal hernia repairs.   CT scan with contrast showed a 8 cm central left renal mass worrisome for malignancy, as well as with some extension versus lymph node involvement versus renal invasion at the hilum.  No evidence of metastatic disease, no vein invasion on MRI.   Renal function is normal with creatinine of 1.01, EGFR greater than 60.     PMH: Past Medical History:  Diagnosis Date   Anemia    Complication of anesthesia    slow to wake after 1 surgery   CPAP use counseling    HTN (hypertension)    OSA on CPAP    Seasonal allergies     Surgical History: Past Surgical History:  Procedure Laterality Date   HERNIA REPAIR     LAPAROSCOPIC APPENDECTOMY N/A 04/27/2021   Procedure: APPENDECTOMY LAPAROSCOPIC;  Surgeon: Fredirick Maudlin, MD;  Location: [facility];  Service: General;  Laterality: N/A;   prostatectomy     REPAIR KNEE LIGAMENT     SHOULDER ARTHROSCOPY WITH SUBACROMIAL DECOMPRESSION AND OPEN ROTATOR C Right 12/29/2020   Procedure: Right shoulder arthroscopic rotator cuff repair, subacromial decompression, and biceps tenodesis;  Surgeon: Leim Fabry, MD;  Location: [facility];  Service: Orthopedics;  Laterality: Right;   TONSILLECTOMY     VASECTOMY       Family History: Family History  Problem Relation Age of Onset   Stroke Mother    Hypertension Father    Emphysema Father     Social History:  reports that he has never smoked. He has never used smokeless tobacco. He reports that he does not currently use alcohol. He reports that he does not use drugs.  Physical  Exam:  Constitutional:  Alert and oriented, No acute distress. Cardiovascular: RRR Respiratory: CTA bilaterally GI: Abdomen is soft, nontender, nondistended, no abdominal masses  Assessment & Plan:   75 year old male with a 8 cm central enhancing left renal mass worrisome for RCC.  We discussed the greater than 90% chance that this represents malignancy.  Tumor is not amenable to partial nephrectomy, and risks and benefits of laparoscopic hand-assisted radical nephrectomy discussed at length.  We discussed the risk of bleeding, infection, hernia, bowel injury, ileus, and recurrence.  We also discussed possible need for adjuvant treatments pending pathology results, as well as need for long-term surveillance imaging.  Left laparoscopic hand-assisted radical nephrectomy today   [name], MD 06/25/2021  [facility] [address] [place], [place] [address] [phone]

## 2021-06-25 NOTE — Anesthesia Procedure Notes (Signed)
Procedure Name: Intubation Date/Time: 06/25/2021 9:34 AM Performed by: Debe Coder, CRNA Pre-anesthesia Checklist: Patient identified, Emergency Drugs available, Suction available and Patient being monitored Patient Re-evaluated:Patient Re-evaluated prior to induction Oxygen Delivery Method: Circle system utilized Preoxygenation: Pre-oxygenation with 100% oxygen Induction Type: IV induction Ventilation: Mask ventilation without difficulty and Oral airway inserted - appropriate to patient size Laryngoscope Size: Mac and 4 Grade View: Grade II Tube type: Oral Number of attempts: 1 Airway Equipment and Method: Stylet and Oral airway Placement Confirmation: ETT inserted through vocal cords under direct vision, positive ETCO2 and breath sounds checked- equal and bilateral Secured at: 23 cm Tube secured with: Tape Dental Injury: Teeth and Oropharynx as per pre-operative assessment  Comments: By Theresa Mulligan, SRNA

## 2021-06-25 NOTE — Anesthesia Preprocedure Evaluation (Signed)
Anesthesia Evaluation  Patient identified by MRN, date of birth, ID band Patient awake    Reviewed: Allergy & Precautions, NPO status , Patient's Chart, lab work & pertinent test results  History of Anesthesia Complications Negative for: history of anesthetic complications  Airway Mallampati: III  TM Distance: >3 FB Neck ROM: Full    Dental no notable dental hx.    Pulmonary sleep apnea and Continuous Positive Airway Pressure Ventilation , neg COPD,    breath sounds clear to auscultation- rhonchi (-) wheezing      Cardiovascular hypertension, Pt. on medications (-) CAD, (-) Past MI, (-) Cardiac Stents and (-) CABG  Rhythm:Regular Rate:Normal - Systolic murmurs and - Diastolic murmurs    Neuro/Psych neg Seizures negative neurological ROS  negative psych ROS   GI/Hepatic negative GI ROS, Neg liver ROS,   Endo/Other  neg diabetes  Renal/GU Renal mass     Musculoskeletal negative musculoskeletal ROS (+)   Abdominal (+) + obese,   Peds  Hematology  (+) anemia ,   Anesthesia Other Findings Past Medical History: No date: Anemia No date: Complication of anesthesia     Comment:  slow to wake after 1 surgery No date: CPAP use counseling No date: HTN (hypertension) No date: OSA on CPAP No date: Seasonal allergies   Reproductive/Obstetrics                             Anesthesia Physical Anesthesia Plan  ASA: 3  Anesthesia Plan: General   Post-op Pain Management:    Induction: Intravenous  PONV Risk Score and Plan: 1 and Ondansetron and Dexamethasone  Airway Management Planned: Oral ETT  Additional Equipment:   Intra-op Plan:   Post-operative Plan: Extubation in OR  Informed Consent: I have reviewed the patients History and Physical, chart, labs and discussed the procedure including the risks, benefits and alternatives for the proposed anesthesia with the patient or authorized  representative who has indicated his/her understanding and acceptance.     Dental advisory given  Plan Discussed with: CRNA and Anesthesiologist  Anesthesia Plan Comments:         Anesthesia Quick Evaluation

## 2021-06-26 ENCOUNTER — Encounter: Payer: Self-pay | Admitting: Urology

## 2021-06-26 LAB — CBC
HCT: 40 % (ref 39.0–52.0)
Hemoglobin: 13.6 g/dL (ref 13.0–17.0)
MCH: 31.3 pg (ref 26.0–34.0)
MCHC: 34 g/dL (ref 30.0–36.0)
MCV: 92 fL (ref 80.0–100.0)
Platelets: 176 10*3/uL (ref 150–400)
RBC: 4.35 MIL/uL (ref 4.22–5.81)
RDW: 13 % (ref 11.5–15.5)
WBC: 13.7 10*3/uL — ABNORMAL HIGH (ref 4.0–10.5)
nRBC: 0 % (ref 0.0–0.2)

## 2021-06-26 LAB — BASIC METABOLIC PANEL
Anion gap: 9 (ref 5–15)
BUN: 23 mg/dL (ref 8–23)
CO2: 25 mmol/L (ref 22–32)
Calcium: 8.3 mg/dL — ABNORMAL LOW (ref 8.9–10.3)
Chloride: 99 mmol/L (ref 98–111)
Creatinine, Ser: 1.63 mg/dL — ABNORMAL HIGH (ref 0.61–1.24)
GFR, Estimated: 43 mL/min — ABNORMAL LOW (ref >60)
Glucose, Bld: 167 mg/dL — ABNORMAL HIGH (ref 70–99)
Potassium: 3.6 mmol/L (ref 3.5–5.1)
Sodium: 133 mmol/L — ABNORMAL LOW (ref 135–145)

## 2021-06-26 MED ORDER — CHLORHEXIDINE GLUCONATE CLOTH 2 % EX PADS
6.0000 | MEDICATED_PAD | Freq: Every day | CUTANEOUS | Status: DC
  Administered 2021-06-26: 6 via TOPICAL

## 2021-06-26 MED ORDER — MENTHOL 3 MG MT LOZG
1.0000 | LOZENGE | OROMUCOSAL | Status: DC | PRN
  Administered 2021-06-26: 3 mg via ORAL
  Filled 2021-06-26 (×2): qty 9

## 2021-06-26 NOTE — Anesthesia Postprocedure Evaluation (Signed)
Anesthesia Post Note  Patient: Michah Schou  Procedure(s) Performed: HAND ASSISTED LAPAROSCOPIC NEPHRECTOMY (Left: Abdomen)  Patient location during evaluation: PACU Anesthesia Type: General Level of consciousness: awake and alert and oriented Pain management: pain level controlled Vital Signs Assessment: post-procedure vital signs reviewed and stable Respiratory status: spontaneous breathing, nonlabored ventilation and respiratory function stable Cardiovascular status: blood pressure returned to baseline and stable Postop Assessment: no signs of nausea or vomiting Anesthetic complications: no   No notable events documented.   Last Vitals:  Vitals:   06/25/21 1953 06/26/21 0507  BP: (!) 152/82 119/72  Pulse: 70 79  Resp: 16 16  Temp: 37.3 C 37.1 C  SpO2: 95% 95%    Last Pain:  Vitals:   06/26/21 0507  TempSrc: Oral  PainSc:                  Jquan Egelston

## 2021-06-26 NOTE — Progress Notes (Signed)
Chaplain Maggie attempted visitation with patient by phone. Patient unavailable.

## 2021-06-26 NOTE — Progress Notes (Signed)
Urology Inpatient Progress Note  Subjective: No acute events overnight. Creatinine up today, 1.63.  WBC count up today, 13.7. Foley catheter in place draining clear, yellow urine. He reports some abdominal pain today.  He is tolerating clears.  No flatus yet.  Anti-infectives: Anti-infectives (From admission, onward)    Start     Dose/Rate Route Frequency Ordered Stop   06/25/21 0839  ceFAZolin (ANCEF) 2-4 GM/100ML-% IVPB       Note to Pharmacy: Sylvester Harder   : cabinet override      06/25/21 0839 06/25/21 0949   06/25/21 0255  ceFAZolin (ANCEF) IVPB 2g/100 mL premix        2 g 200 mL/hr over 30 Minutes Intravenous 30 min pre-op 06/25/21 0255 06/25/21 0945       Current Facility-Administered Medications  Medication Dose Route Frequency Provider Last Rate Last Admin   0.45 % sodium chloride infusion   Intravenous Continuous Billey Co, MD 75 mL/hr at 06/26/21 0332 New Bag at 06/26/21 [id number]   acetaminophen (TYLENOL) tablet 650 mg  650 mg Oral Q4H PRN Billey Co, MD       Chlorhexidine Gluconate Cloth 2 % PADS 6 each  6 each Topical Daily Billey Co, MD   6 each at 06/26/21 0820   docusate sodium (COLACE) capsule 100 mg  100 mg Oral BID Billey Co, MD   100 mg at 06/26/21 [id number]   ezetimibe (ZETIA) tablet 10 mg  10 mg Oral Q M,W,F Billey Co, MD       heparin injection 5,000 Units  5,000 Units Subcutaneous Q8H Billey Co, MD   5,000 Units at 06/26/21 0540   hydrochlorothiazide (HYDRODIURIL) tablet 25 mg  25 mg Oral Daily Billey Co, MD   25 mg at 06/26/21 [id number]   HYDROcodone-acetaminophen (NORCO/VICODIN) 5-325 MG per tablet 1-2 tablet  1-2 tablet Oral Q4H PRN Billey Co, MD   2 tablet at 06/25/21 1831   losartan (COZAAR) tablet 100 mg  100 mg Oral Daily Billey Co, MD   100 mg at 06/26/21 [id number]   menthol-cetylpyridinium (CEPACOL) lozenge 3 mg  1 lozenge Oral PRN Billey Co, MD   3 mg at 06/26/21 0640   ondansetron (ZOFRAN)  injection 4 mg  4 mg Intravenous Q4H PRN Billey Co, MD       Objective: Vital signs in last 24 hours: Temp:  [97.2 F (36.2 C)-99.2 F (37.3 C)] 97.9 F (36.6 C) (08/30 0818) Pulse Rate:  [58-79] 70 (08/30 0818) Resp:  [10-22] 17 (08/30 0818) BP: (119-152)/(72-90) 119/73 (08/30 0818) SpO2:  [87 %-100 %] 92 % (08/30 0818) Weight:  [108.9 kg] 108.9 kg (08/29 0858)  Intake/Output from previous day: 08/29 0701 - 08/30 0700 In: [id number] [I.V.:1375] Out: 710 [Urine:650; Blood:60] Intake/Output this shift: No intake/output data recorded.  Physical Exam Vitals and nursing note reviewed.  Constitutional:      General: He is not in acute distress.    Appearance: He is not ill-appearing, toxic-appearing or diaphoretic.  HENT:     Head: Normocephalic and atraumatic.  Pulmonary:     Effort: Pulmonary effort is normal. No respiratory distress.  Abdominal:     Palpations: Abdomen is soft.     Tenderness: There is no guarding or rebound.     Comments: Surgical incisions visualized over the anterior abdomen, all clean, dry, and intact with overlying surgical adhesive.  No flank ecchymosis.  Skin:    General: Skin is  warm and dry.  Neurological:     Mental Status: He is alert and oriented to person, place, and time.  Psychiatric:        Mood and Affect: Mood normal.        Behavior: Behavior normal.    Lab Results:  Recent Labs    06/26/21 0532  WBC 13.7*  HGB 13.6  HCT 40.0  PLT 176   BMET Recent Labs    06/26/21 0532  NA 133*  K 3.6  CL 99  CO2 25  GLUCOSE 167*  BUN 23  CREATININE 1.63*  CALCIUM 8.3*   Assessment & Plan: 75 year old male POD 1 from left hand-assisted laparoscopic radical nephrectomy with Dr. Diamantina Providence for management of a left renal mass.  Normal postoperative lab values today, but otherwise patient is recovering well.  Will continue with plans for discharge hopefully tomorrow, as patient lives alone with minimal assistance.  Okay to discontinue  Foley catheter and advance diet today, orders placed.  We discussed that the goals for today include tolerating p.o. intake, ambulation, and pain control.  Debroah Loop, PA-C 06/26/2021

## 2021-06-27 MED ORDER — DOCUSATE SODIUM 100 MG PO CAPS: 100.0000 mg | ORAL_CAPSULE | Freq: Two times a day (BID) | ORAL | 0 refills | Status: DC

## 2021-06-27 MED ORDER — HYDROCODONE-ACETAMINOPHEN 5-325 MG PO TABS: 1.0000 | ORAL_TABLET | ORAL | 0 refills | Status: AC | PRN

## 2021-06-27 NOTE — Evaluation (Signed)
Occupational Therapy Evaluation Patient Details Name: Peter Stuart MRN: [id number] DOB: 07/11/1946 Today's Date: 06/27/2021    History of Present Illness Pt is a 75 yo M with a history of prostate cancer treated with robotic prostatectomy 2009.  His past surgical history is notable for robotic prostatectomy, recent appendectomy, as well as history of bilateral inguinal hernia repairs.  CT scan with contrast showed a 8 cm central left renal mass worrisome for malignancy with pt now s/p L kidney laparoscopic radical nephrectomy.   Clinical Impression   Peter Stuart was seen for OT evaluation this date. Prior to hospital admission, pt was Independent for mobility and ADLs. Pt lives alone with family available 24/7. Pt presents to acute OT demonstrating impaired ADL performance and functional mobility 2/2 decreased activity tolerance and functional ROM deficits. Pt instructed on adpated dressing strategies for adominal precautions - implemented and required MOD I don underwear in chair. HHA + SUPERVISION donning B shoes in standing. SUPERVISION for toileting standing at commode - assist to manage abdominal binder in standing. All questions answered and good carryover of instructions provided. Will sign off, no OT follow up needed.      Follow Up Recommendations  No OT follow up    Equipment Recommendations  3 in 1 bedside commode;Tub/shower seat    Recommendations for Other Services       Precautions / Restrictions Precautions Precautions: Fall Restrictions Weight Bearing Restrictions: No Other Position/Activity Restrictions: Abdominal binder to be donned with ambulation      Mobility Bed Mobility Overal bed mobility: Needs Assistance Bed Mobility: Rolling;Sit to Sidelying;Sidelying to Sit Rolling: Supervision Sidelying to sit: Supervision     Sit to sidelying: Supervision General bed mobility comments: VCs for log roll    Transfers Overall transfer level: Needs  assistance Equipment used: Rolling walker (2 wheeled) Transfers: Sit to/from Stand Sit to Stand: Supervision              Balance Overall balance assessment: Needs assistance   Sitting balance-Leahy Scale: Normal     Standing balance support: No upper extremity supported;During functional activity Standing balance-Leahy Scale: Good                             ADL either performed or assessed with clinical judgement   ADL Overall ADL's : Needs assistance/impaired                                       General ADL Comments: Pt instructed on adpated dressing strategies for adominal precautions - implemented and required MOD I don underwear in chair. HHA + SUPERVISION donning B shoes in standing. SUPERVISION for toileting standing at commode - assist to manage abdominal binder in standing.      Pertinent Vitals/Pain Pain Assessment: Faces Faces Pain Scale: Hurts little more Pain Location: incision area with movement only, 0/10 at rest Pain Descriptors / Indicators: Aching;Discomfort;Grimacing Pain Intervention(s): Limited activity within patient's tolerance;Repositioned     Hand Dominance     Extremity/Trunk Assessment Upper Extremity Assessment Upper Extremity Assessment: Overall WFL for tasks assessed   Lower Extremity Assessment Lower Extremity Assessment: Overall WFL for tasks assessed       Communication Communication Communication: No difficulties   Cognition Arousal/Alertness: Awake/alert Behavior During Therapy: WFL for tasks assessed/performed Overall Cognitive Status: Within Functional Limits for tasks assessed  General Comments       Exercises Exercises: Other exercises Other Exercises Other Exercises: Pt and family educated re: OT role, DME recs, d/c recs, falls prevention, ECS, adapted strategies for pain mgmt, functional spplication of abdominal pcns Other Exercises:  LBD, toileting, log rolling, sit<>stand x4, sitting/standing balance/tolerance   Shoulder Instructions      Home Living Family/patient expects to be discharged to:: Private residence Living Arrangements: Alone Available Help at Discharge: Family;Available 24 hours/day Type of Home: House Home Access: Level entry     Home Layout: One level     Bathroom Shower/Tub: Occupational psychologist: Standard     Home Equipment: Bedside commode;Walker - 2 wheels;Cane - single point;Shower seat - built in          Prior Functioning/Environment Level of Independence: Independent        Comments: Ind amb community distances without an AD, no fall history, Ind with ADLs, works as an Academic librarian Problem List: Decreased range of motion;Decreased activity tolerance      OT Treatment/Interventions:      OT Goals(Current goals can be found in the care plan section) Acute Rehab OT Goals Patient Stated Goal: To move without pain OT Goal Formulation: With patient/family Time For Goal Achievement: 07/11/21 Potential to Achieve Goals: Good ADL Goals Pt Will Perform Grooming: Independently Pt Will Perform Lower Body Dressing: Independently;sit to/from stand Pt Will Transfer to Toilet: Independently;regular height toilet  OT Frequency:      AM-PAC OT "6 Clicks" Daily Activity     Outcome Measure Help from another person eating meals?: None Help from another person taking care of personal grooming?: A Little Help from another person toileting, which includes using toliet, bedpan, or urinal?: A Little Help from another person bathing (including washing, rinsing, drying)?: A Little Help from another person to put on and taking off regular upper body clothing?: None Help from another person to put on and taking off regular lower body clothing?: A Little 6 Click Score: 20   End of Session Nurse Communication: Mobility status  Activity Tolerance: Patient tolerated  treatment well Patient left: in bed;with call bell/phone within reach;with bed alarm set;with family/visitor present  OT Visit Diagnosis: Other abnormalities of gait and mobility (R26.89)                Time: [id number] OT Time Calculation (min): 29 min Charges:  OT General Charges $OT Visit: 1 Visit OT Evaluation $OT Eval Low Complexity: 1 Low OT Treatments $Self Care/Home Management : 23-37 mins  Dessie Coma, M.S. OTR/L  06/27/21, 4:07 PM  ascom [phone]

## 2021-06-27 NOTE — Progress Notes (Signed)
Patient discharged via home accompanied by family. Discharge instructions given, patient discharged with all pertinent information, prescriptions, and personal belongings. Patient able to teach back instructions and stated understanding.  IV d/ced.  No acute distress noted. Care relinquished.

## 2021-06-27 NOTE — Progress Notes (Signed)
   Subjective Desats overnight with pain meds Still having incisional abdominal pain No flatus or BM Voiding yellow urine spontaneously Ambulated x1 yesterday  Physical Exam: BP 135/79 (BP Location: Left Arm)   Pulse 70   Temp 98.1 F (36.7 C) (Oral)   Resp 14   Ht '5\' 9"'$  (1.753 m)   Wt 108.9 kg   SpO2 93%   BMI 35.44 kg/m    Constitutional:  Alert and oriented, No acute distress. Respiratory: Normal respiratory effort, no increased work of breathing. GI: Abdomen is soft, minimally tender, mildly distended, incisions clean dry and intact with Dermabond  Laboratory Data: Reviewed from yesterday in epic   Assessment & Plan:   75 year old male POD#2 from left hand-assisted laparoscopic radical nephrectomy for 8 cm renal mass.  Recovering as expected, will add abdominal binder today, physical therapy consult regarding possible need for placement/rehab/home health nursing as he lives alone.  Possible discharge tomorrow.  DC maintenance IV fluids, general diet Ambulate x3-4 times a day Abdominal binder, PT consult Dispo: Possible DC tomorrow, placement pending PT evaluation  Billey Co, MD

## 2021-06-27 NOTE — TOC Initial Note (Signed)
Transition of Care [facility]) - Initial/Assessment Note    Patient Details  Name: Peter Stuart MRN: [id number] Date of Birth: 1945-12-29  Transition of Care [facility]) CM/SW Contact:    Peter Sessions, RN Phone Number: 06/27/2021, 2:38 PM  Clinical Narrative:                  Patient admitted from homes s/p nephrectomy  Patient lives at home alone Daughters live locally and assist with transportation to appointments   PCP Peter Stuart  Denies issues obtaining medications  No DME needs at discharge  PT recommending home health. Patient in agreement.  Daughter Peter Stuart  states they do not have a preference of home health agency.  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   Acute appendicitis 04/27/2021   Pain and swelling of lower leg, right 03/05/2021   Prostate cancer ([place]) 03/05/2021   DNR (do not resuscitate) 06/13/2020   Medicare annual wellness visit, subsequent 06/13/2020   Hip pain, bilateral 03/03/2020   Status post rotator cuff surgery 03/03/2020   Type 2 diabetes mellitus with hyperlipidemia ([place]) 02/16/2020   Obesity, morbid ([place]) 02/14/2020   OSA on CPAP 10/15/2019   Status post prostatectomy 06/19/2017   Polyneuropathy 09/17/2016   B12 deficiency 08/13/2016   Lumbar radiculopathy 06/12/2016   Mixed hyperlipidemia 11/30/2015   Essential hypertension 04/26/2015   Memory loss 02/18/2015   History of squamous cell carcinoma of skin 02/15/2015   PCP:  Peter Body, MD Pharmacy:    [facility] [id number], [address] [place] AT [address] [place] [address] Phone: [phone] Fax: [phone]     Social Determinants of Health ([place]) Interventions    Readmission Risk Interventions No flowsheet data found.

## 2021-06-27 NOTE — Evaluation (Signed)
Physical Therapy Evaluation Patient Details Name: Peter Stuart MRN: [id number] DOB: 10-12-1946 Today's Date: 06/27/2021   History of Present Illness  Pt is a 75 yo M with a history of prostate cancer treated with robotic prostatectomy 2009.  His past surgical history is notable for robotic prostatectomy, recent appendectomy, as well as history of bilateral inguinal hernia repairs.  CT scan with contrast showed a 8 cm central left renal mass worrisome for malignancy with pt now s/p L kidney laparoscopic radical nephrectomy.   Clinical Impression  Pt was pleasant and motivated to participate during the session and performed well during the session. Pt reported a history of pain with bed mobility tasks s/p surgery with log roll training provided and with pt reporting decreased pain with log rolling.  Pt was steady with transfers with good control and stability.  Pt was able to amb 200+ feet with a RW with slow, cautious cadence that began as step-to but progressed to step-through with cues for sequencing.  Although pt ambulated slowly with short B step length he was steady without LOB and with SpO2 93% and HR WNL throughout. Pt will benefit from HHPT upon discharge to safely address deficits listed in patient problem list for decreased caregiver assistance and eventual return to PLOF.       Follow Up Recommendations Home health PT;Supervision - Intermittent    Equipment Recommendations  None recommended by PT    Recommendations for Other Services       Precautions / Restrictions Precautions Precautions: Fall Restrictions Weight Bearing Restrictions: No Other Position/Activity Restrictions: Abdominal binder to be donned with ambulation      Mobility  Bed Mobility Overal bed mobility: Needs Assistance Bed Mobility: Rolling;Sit to Sidelying;Sidelying to Sit Rolling: Supervision Sidelying to sit: Supervision     Sit to sidelying: Supervision General bed mobility comments: Log roll  training with min to mod cues for sequencing with pt needing extra time and effort to perform but no physical assist needed    Transfers Overall transfer level: Needs assistance Equipment used: Rolling walker (2 wheeled) Transfers: Sit to/from Stand Sit to Stand: Supervision         General transfer comment: Good eccentric and concentric control and stability  Ambulation/Gait Ambulation/Gait assistance: Supervision Gait Distance (Feet): 250 Feet Assistive device: Rolling walker (2 wheeled) Gait Pattern/deviations: Step-through pattern;Decreased step length - right;Decreased step length - left Gait velocity: decreased   General Gait Details: Slow cadence but steady without LOB with SpO2 93% and HR in the 80s, no adverse symptoms reported  Stairs            Wheelchair Mobility    Modified Rankin (Stroke Patients Only)       Balance Overall balance assessment: Needs assistance   Sitting balance-Leahy Scale: Normal     Standing balance support: Bilateral upper extremity supported;During functional activity Standing balance-Leahy Scale: Good                               Pertinent Vitals/Pain Pain Assessment: 0-10 Pain Score: 4  Pain Location: incision area with movement only, 0/10 at rest Pain Intervention(s): Premedicated before session;Monitored during session;Repositioned    Home Living Family/patient expects to be discharged to:: Private residence Living Arrangements: Alone Available Help at Discharge: Family;Available 24 hours/day Type of Home: House Home Access: Level entry     Home Layout: One level Home Equipment: Bedside commode;Walker - 2 wheels;Cane - single point;Shower seat -  built in      Prior Function Level of Independence: Independent         Comments: Ind amb community distances without an AD, no fall history, Ind with ADLs, works as an Arboriculturist        Extremity/Trunk Assessment   Upper  Extremity Assessment Upper Extremity Assessment: Overall WFL for tasks assessed    Lower Extremity Assessment Lower Extremity Assessment: Overall WFL for tasks assessed       Communication   Communication: No difficulties  Cognition Arousal/Alertness: Awake/alert Behavior During Therapy: WFL for tasks assessed/performed Overall Cognitive Status: Within Functional Limits for tasks assessed                                        General Comments      Exercises Total Joint Exercises Ankle Circles/Pumps: AROM;Both;10 reps Quad Sets: Strengthening;Both;10 reps Gluteal Sets: Strengthening;10 reps;Both Other Exercises Other Exercises: Log roll training for pain control with bed mobility tasks Other Exercises: HEP education for BLE APs, QS, and GS x 10 each every 1-2 hours daily   Assessment/Plan    PT Assessment Patient needs continued PT services  PT Problem List Decreased activity tolerance;Decreased balance;Decreased mobility;Pain;Decreased knowledge of use of DME       PT Treatment Interventions DME instruction;Gait training;Functional mobility training;Therapeutic activities;Therapeutic exercise;Balance training;Patient/family education    PT Goals (Current goals can be found in the Care Plan section)  Acute Rehab PT Goals Patient Stated Goal: To move without pain PT Goal Formulation: With patient Time For Goal Achievement: 07/10/21 Potential to Achieve Goals: Good    Frequency Min 2X/week   Barriers to discharge        Co-evaluation               AM-PAC PT "6 Clicks" Mobility  Outcome Measure Help needed turning from your back to your side while in a flat bed without using bedrails?: A Little Help needed moving from lying on your back to sitting on the side of a flat bed without using bedrails?: A Little Help needed moving to and from a bed to a chair (including a wheelchair)?: A Little Help needed standing up from a chair using your arms  (e.g., wheelchair or bedside chair)?: A Little Help needed to walk in hospital room?: A Little Help needed climbing 3-5 steps with a railing? : A Little 6 Click Score: 18    End of Session Equipment Utilized During Treatment: Gait belt Activity Tolerance: Patient tolerated treatment well Patient left: with family/visitor present;with call bell/phone within reach;in chair;with chair alarm set Nurse Communication: Mobility status PT Visit Diagnosis: Pain;Difficulty in walking, not elsewhere classified (R26.2) Pain - part of body:  (incision site)    Time: [id number] (and 10:48-11:13) PT Time Calculation (min) (ACUTE ONLY): 35 min   Charges:   PT Evaluation $PT Eval Moderate Complexity: 1 Mod PT Treatments $Therapeutic Activity: 8-22 mins       D. Scott Adaisha Campise PT, DPT 06/27/21, 11:58 AM

## 2021-06-28 ENCOUNTER — Other Ambulatory Visit: Payer: Self-pay | Admitting: Anatomic Pathology & Clinical Pathology

## 2021-06-28 LAB — SURGICAL PATHOLOGY

## 2021-06-28 NOTE — Discharge Summary (Addendum)
Physician Discharge Summary  Patient ID: Peter Stuart MRN: [id number] DOB/AGE: [birth year] 77 y.o.  Admit date: 06/25/2021 Discharge date: 06/27/21  Admission Diagnoses: Left renal mass  Discharge Diagnoses: Left renal mass  Discharged Condition: Good  Hospital Course:  75 year old male who underwent an uncomplicated left laparoscopic hand-assisted radical nephrectomy for 8 cm enhancing left central renal mass.  On postop day number 2 he was tolerating a general diet, ambulating with minimal pain, and physical exam was benign.  He was cleared for physical therapy for discharge home.   exam: Blood pressure (!) 141/78, pulse 69, temperature 99.1 F (37.3 C), temperature source Oral, resp. rate 18, height '5\' 9"'$  (1.753 m), weight 108.9 kg, SpO2 93 %.  Abdomen soft, mildly distended, nontender, incisions clean dry and intact  Disposition: Discharge disposition: 01-Home or Self Care       Discharge Instructions     Discharge instructions   Complete by: As directed    You underwent laparoscopic removal of your left kidney for a tumor.  No heavy lifting more than 20 pounds for at least 4 to 6 weeks.  Okay to shower starting today and wash her incision gently with soap and water and pat dry.  The skin glue will fall off over the next few weeks, and the stitches are all dissolvable.  Eat small meals, and avoid heavy or fried/fatty foods.  Protein shakes like Ensure or boost are good for recovery from surgery.  Drink plenty of fluids to stay hydrated.  Okay to try a suppository over-the-counter if you are having trouble with a bowel movement or constipation.  Take the Colace to help prevent constipation while taking the narcotics.  Okay to take Tylenol, but would avoid ibuprofen or Aleve as the remaining kidney heals, and take the narcotic Norco as needed for severe pain.  Walk daily to prevent blood clots.  If you have shortness of breath, fever, severe abdominal pain or  nausea/vomiting, please call the clinic or present to the ER.  The clinic will call to schedule follow-up in a few weeks to discuss pathology results and check your incisions.      Allergies as of 06/27/2021       Reactions   Statins    Muscle ache        Medication List     TAKE these medications    Allegra Allergy 60 MG tablet Generic drug: fexofenadine Take 60 mg by mouth daily.   cholecalciferol 25 MCG (1000 UNIT) tablet Commonly known as: VITAMIN D3 Take 1,000 Units by mouth daily.   Coenzyme Q10 10 MG capsule Take 10 mg by mouth daily.   docusate sodium 100 MG capsule Commonly known as: COLACE Take 1 capsule (100 mg total) by mouth 2 (two) times daily. Hold for diarrhea or loose stools   ezetimibe 10 MG tablet Commonly known as: ZETIA Take 10 mg by mouth every Monday, Wednesday, and Friday.   fluticasone 50 MCG/ACT nasal spray Commonly known as: FLONASE Place 1 spray into the nose in the morning and at bedtime.   gabapentin 400 MG capsule Commonly known as: NEURONTIN Take 400 mg by mouth at bedtime.   hydrochlorothiazide 25 MG tablet Commonly known as: HYDRODIURIL Take 25 mg by mouth daily.   HYDROcodone-acetaminophen 5-325 MG tablet Commonly known as: NORCO/VICODIN Take 1-2 tablets by mouth every 4 (four) hours as needed for up to 3 days for moderate pain.   losartan 100 MG tablet Commonly known as: COZAAR Take 100 mg by  mouth daily.   multivitamin capsule Take 1 capsule by mouth daily.   Ocuvite Eye Heatlh Gummies Chew Chew 1 tablet by mouth daily.   terbinafine 1 % cream Commonly known as: LAMISIL Apply 1 application topically daily as needed (irritation).         Signed: [name], MD

## 2021-07-04 ENCOUNTER — Encounter: Payer: Self-pay | Admitting: Urology

## 2021-07-05 ENCOUNTER — Other Ambulatory Visit: Payer: Medicare Other

## 2021-07-05 NOTE — Progress Notes (Signed)
Tumor Board Documentation  Peter Stuart was presented by Dr Pascal Lux and Dr Erlene Quan at our Tumor Board on 07/05/2021, which included representatives from medical oncology, radiation oncology, internal medicine, navigation, pathology, radiology, surgical, genetics, research, palliative care, pulmonology, pharmacy.  Peter Stuart currently presents as an external consult, for new positive pathology, for [place] with history of the following treatments: active survellience, surgical intervention(s).  Additionally, we reviewed previous medical and familial history, history of present illness, and recent lab results along with all available histopathologic and imaging studies. The tumor board considered available treatment options and made the following recommendations: Active surveillance Pt does not want adjuvant treatment  The following procedures/referrals were also placed: No orders of the defined types were placed in this encounter.   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  Tumor board is a meeting of clinicians from various specialty areas who evaluate and discuss patients for whom a multidisciplinary approach is being considered. Final determinations in the plan of care are those of the provider(s). The responsibility for follow up of recommendations given during tumor board is that of the provider.   Today's extended care, comprehensive team conference, Peter Stuart was not present for the discussion and was not examined.   Multidisciplinary Tumor Board is a multidisciplinary case peer review process.  Decisions discussed in the Multidisciplinary Tumor Board reflect the opinions of the specialists present at the conference without having examined the patient.  Ultimately, treatment and diagnostic decisions rest with the primary  provider(s) and the patient.

## 2021-07-08 DIAGNOSIS — Z905 Acquired absence of kidney: Secondary | ICD-10-CM | POA: Insufficient documentation

## 2021-07-12 ENCOUNTER — Telehealth: Payer: Self-pay | Admitting: Urology

## 2021-07-12 NOTE — Telephone Encounter (Signed)
Returned call to patient 3 wks post Hand Assist Nephrectomy, patient states that his pain started yesterday after lifting something around 10lbs. Pain is located in left lower quad of abdomen and intermittent, noted mostly with movement, 2/10 on pain scale. Patient was instructed to limit lifting (keeping under 10lbs)  and straining for the interm and may try heat/ice to help with discomfort (not directly on skin). If pain worsens or more symptoms arise such as weeping/ drainage at incision sites are noted call back for further evaluation. Patient verbalized understanding. Will keep post op as scheduled

## 2021-07-12 NOTE — Telephone Encounter (Signed)
Pt. Left message on voicemail stating he is having abdominal pain that may be related to surgery on 06/25/21 and would like to speak to someone about these symptoms.

## 2021-07-19 DIAGNOSIS — N183 Chronic kidney disease, stage 3 unspecified: Secondary | ICD-10-CM | POA: Insufficient documentation

## 2021-07-24 ENCOUNTER — Encounter: Payer: Self-pay | Admitting: Urology

## 2021-07-24 ENCOUNTER — Other Ambulatory Visit: Payer: Self-pay

## 2021-07-24 ENCOUNTER — Ambulatory Visit (INDEPENDENT_AMBULATORY_CARE_PROVIDER_SITE_OTHER): Payer: Medicare Other | Admitting: Urology

## 2021-07-24 VITALS — BP 115/73 | HR 69 | Ht 69.0 in | Wt 241.0 lb

## 2021-07-24 DIAGNOSIS — C61 Malignant neoplasm of prostate: Secondary | ICD-10-CM

## 2021-07-24 DIAGNOSIS — Z85528 Personal history of other malignant neoplasm of kidney: Secondary | ICD-10-CM

## 2021-07-24 NOTE — Progress Notes (Signed)
   07/24/2021 12:48 PM   Peter Stuart 1946-06-05 [id number]  Reason for visit: Follow up left radical nephrectomy  HPI: 75 year old male with history of prostate cancer treated with robotic prostatectomy in [place] in 2009 and undetectable PSA since that time who was recently found to have a large 8 cm central left enhancing renal mass worrisome for malignancy.  He underwent an uncomplicated left hand-assisted laparoscopic radical nephrectomy on 06/25/2021, with pathology showing renal cell carcinoma, Fuhrman grade 2, stage pT3a with negative margins.  He has been recovering appropriately.  On exam incisions are healing as expected, and abdomen is soft and nontender nondistended.  He had some mild left-sided lower quadrant pain a few weeks ago that has resolved.  He is tolerating general diet and having normal bowel movements.  We reviewed the AUA guidelines regarding the need for close surveillance moving forward.  We also discussed the controversies and data surrounding immunotherapy in the adjuvant setting for RCC, and I recommended follow-up with oncology to discuss further.  With his age, observation is certainly reasonable.  We reviewed the guidelines that recommend CT of the chest, abdomen, and pelvis every 6 months for the next 5 years.  He understands the risk of recurrence and surveillance moving forward.  Referral placed to oncology to consider adjuvant immunotherapy Has follow-up with nephrology regarding long-term monitoring of renal function RTC with me 6 months with CT chest/abdomen/pelvis prior, continue imaging every 6 months for 5 years   Billey Co, MD  [place] [address] [place], [place] [address] [phone]

## 2021-07-30 DIAGNOSIS — E119 Type 2 diabetes mellitus without complications: Secondary | ICD-10-CM | POA: Insufficient documentation

## 2021-08-01 ENCOUNTER — Other Ambulatory Visit: Payer: Self-pay | Admitting: Nephrology

## 2021-08-01 ENCOUNTER — Other Ambulatory Visit (HOSPITAL_COMMUNITY): Payer: Self-pay | Admitting: Nephrology

## 2021-08-01 DIAGNOSIS — N1832 Chronic kidney disease, stage 3b: Secondary | ICD-10-CM

## 2021-08-06 ENCOUNTER — Inpatient Hospital Stay: Payer: Medicare Other

## 2021-08-06 ENCOUNTER — Other Ambulatory Visit: Payer: Self-pay

## 2021-08-06 ENCOUNTER — Encounter: Payer: Self-pay | Admitting: Oncology

## 2021-08-06 ENCOUNTER — Inpatient Hospital Stay: Payer: Medicare Other | Attending: Oncology | Admitting: Oncology

## 2021-08-06 VITALS — BP 113/80 | HR 67 | Temp 99.0°F | Resp 18 | Ht 71.0 in | Wt 245.0 lb

## 2021-08-06 DIAGNOSIS — Z8546 Personal history of malignant neoplasm of prostate: Secondary | ICD-10-CM | POA: Insufficient documentation

## 2021-08-06 DIAGNOSIS — Z905 Acquired absence of kidney: Secondary | ICD-10-CM | POA: Diagnosis not present

## 2021-08-06 DIAGNOSIS — Z7189 Other specified counseling: Secondary | ICD-10-CM | POA: Diagnosis not present

## 2021-08-06 DIAGNOSIS — C642 Malignant neoplasm of left kidney, except renal pelvis: Secondary | ICD-10-CM | POA: Insufficient documentation

## 2021-08-06 NOTE — Progress Notes (Signed)
Hematology/Oncology Consult note [facility] [phone] [phone]   Patient Care Team: Dion Body, MD as PCP - General (Family Medicine)  REFERRING PROVIDER: Billey Co, MD  CHIEF COMPLAINTS/REASON FOR VISIT:  Evaluation of RCC  HISTORY OF PRESENTING ILLNESS:   Peter Stuart is a  75 y.o.  male with PMH listed below was seen in consultation at the request of  Billey Co, MD  for evaluation of [place]  04/27/2021, patient presented emergency room for right lower quadrant pain.  CT scan showed acute appendicitis and he was taken to the OR and had uncomplicated laparoscopic appendectomy.  There was also incidental findings of kidney mass. 04/27/2021, CT abdomen pelvis showed acute appendicitis without evidence of perforation or abscess.  Large left renal mass consistent with renal malignancy with renal vein invasion.  Focal annular thickening of descending colon. 7/ 25 /2022, CT chest with contrast showed large sliding-type hiatal hernia.  Mild bilateral posterior bibasilar subsegmental atelectasis.  06/25/2021, patient underwent radical nephrectomy by Dr. Diamantina Providence. Pathology showed clear-cell renal cell carcinoma, nuclear grade 2.  Benign adrenal tissue.  Lymphovascular invasion present. pT3a pNx   Patient's case was discussed on tumor board on 07/05/2021.  Active surveillance was recommended because patient does not want adjuvant treatment.  Patient has questions about adjuvant treatment and was referred to establish care with oncology for discussion of adjuvant therapy.  Per urology note,patient also had a history of prostate cancer treated with robotic prostatectomy in [place] in 2009.  His PSA back in 2001 was undetectable.    Patient has no new complaints today.  Denies any abdominal pain, unintentional weight loss, cough, nausea vomiting diarrhea.  Patient has right shoulder pain, history of right shoulder history.  He takes  Tylenol as needed for pain.   Review of Systems  Constitutional:  Negative for appetite change, chills, fever and unexpected weight change.  HENT:   Negative for hearing loss and voice change.   Eyes:  Negative for eye problems and icterus.  Respiratory:  Negative for chest tightness, cough and shortness of breath.   Cardiovascular:  Negative for chest pain and leg swelling.  Gastrointestinal:  Negative for abdominal distention and abdominal pain.  Endocrine: Negative for hot flashes.  Genitourinary:  Negative for difficulty urinating, dysuria and frequency.   Musculoskeletal:  Negative for arthralgias.  Skin:  Negative for itching and rash.  Neurological:  Negative for light-headedness and numbness.  Hematological:  Negative for adenopathy. Does not bruise/bleed easily.  Psychiatric/Behavioral:  Negative for confusion.    MEDICAL HISTORY:  Past Medical History:  Diagnosis Date   Anemia    Cancer ([place])    Complication of anesthesia    slow to wake after 1 surgery   CPAP use counseling    HTN (hypertension)    OSA on CPAP    Prostate cancer (HCC)    Seasonal allergies    Skin cancer     SURGICAL HISTORY: Past Surgical History:  Procedure Laterality Date   HERNIA REPAIR     LAPAROSCOPIC APPENDECTOMY N/A 04/27/2021   Procedure: APPENDECTOMY LAPAROSCOPIC;  Surgeon: Fredirick Maudlin, MD;  Location: [facility] ORS;  Service: General;  Laterality: N/A;   LAPAROSCOPIC NEPHRECTOMY, HAND ASSISTED Left 06/25/2021   Procedure: HAND ASSISTED LAPAROSCOPIC NEPHRECTOMY;  Surgeon: Billey Co, MD;  Location: [facility] ORS;  Service: Urology;  Laterality: Left;   prostatectomy     REPAIR KNEE LIGAMENT     SHOULDER ARTHROSCOPY WITH SUBACROMIAL DECOMPRESSION AND OPEN ROTATOR  C Right 12/29/2020   Procedure: Right shoulder arthroscopic rotator cuff repair, subacromial decompression, and biceps tenodesis;  Surgeon: Leim Fabry, MD;  Location: [facility];  Service: Orthopedics;  Laterality: Right;    TONSILLECTOMY     VASECTOMY      SOCIAL HISTORY: Social History   Socioeconomic History   Marital status: Widowed    Spouse name: Not on file   Number of children: Not on file   Years of education: Not on file   Highest education level: Not on file  Occupational History   Not on file  Tobacco Use   Smoking status: Never   Smokeless tobacco: Never  Vaping Use   Vaping Use: Never used  Substance and Sexual Activity   Alcohol use: Not Currently   Drug use: Never   Sexual activity: Not Currently  Other Topics Concern   Not on file  Social History Narrative   Not on file   Social Determinants of Health   Financial Resource Strain: Not on file  Food Insecurity: Not on file  Transportation Needs: Not on file  Physical Activity: Not on file  Stress: Not on file  Social Connections: Not on file  Intimate Partner Violence: Not on file    FAMILY HISTORY: Family History  Problem Relation Age of Onset   Stroke Mother    Hypertension Father    Emphysema Father     ALLERGIES:  has no active allergies.  MEDICATIONS:  Current Outpatient Medications  Medication Sig Dispense Refill   ALLEGRA ALLERGY 60 MG tablet Take 60 mg by mouth daily.     cholecalciferol (VITAMIN D3) 25 MCG (1000 UNIT) tablet Take 1,000 Units by mouth daily.     Coenzyme Q10 10 MG capsule Take 10 mg by mouth daily.     ezetimibe (ZETIA) 10 MG tablet Take 10 mg by mouth every Monday, Wednesday, and Friday.     fluticasone (FLONASE) 50 MCG/ACT nasal spray Place 1 spray into the nose in the morning and at bedtime.     gabapentin (NEURONTIN) 400 MG capsule Take 400 mg by mouth at bedtime.     hydrochlorothiazide (HYDRODIURIL) 25 MG tablet Take 25 mg by mouth daily.     losartan (COZAAR) 100 MG tablet Take 100 mg by mouth daily.     Multiple Vitamin (MULTIVITAMIN) capsule Take 1 capsule by mouth daily.     terbinafine (LAMISIL) 1 % cream Apply 1 application topically daily as needed (irritation).      clotrimazole (LOTRIMIN) 1 % cream Apply topically 2 (two) times daily.     No current facility-administered medications for this visit.     PHYSICAL EXAMINATION: ECOG PERFORMANCE STATUS: 0 - Asymptomatic Vitals:   08/06/21 1138  BP: 113/80  Pulse: 67  Resp: 18  Temp: 99 F (37.2 C)  SpO2: 98%   Filed Weights   08/06/21 1138  Weight: 245 lb (111.1 kg)    Physical Exam Constitutional:      General: He is not in acute distress.    Appearance: He is obese.  HENT:     Head: Normocephalic and atraumatic.  Eyes:     General: No scleral icterus. Cardiovascular:     Rate and Rhythm: Normal rate and regular rhythm.     Heart sounds: Normal heart sounds.  Pulmonary:     Effort: Pulmonary effort is normal. No respiratory distress.     Breath sounds: No wheezing.  Abdominal:     General: Bowel sounds are normal.  There is no distension.     Palpations: Abdomen is soft.  Musculoskeletal:        General: No deformity. Normal range of motion.     Cervical back: Normal range of motion and neck supple.  Skin:    General: Skin is warm and dry.     Findings: No erythema or rash.  Neurological:     Mental Status: He is alert and oriented to person, place, and time. Mental status is at baseline.     Cranial Nerves: No cranial nerve deficit.     Coordination: Coordination normal.  Psychiatric:        Mood and Affect: Mood normal.    LABORATORY DATA:  I have reviewed the data as listed Lab Results  Component Value Date   WBC 13.7 (H) 06/26/2021   HGB 13.6 06/26/2021   HCT 40.0 06/26/2021   MCV 92.0 06/26/2021   PLT 176 06/26/2021   Recent Labs    04/27/21 1055 06/26/21 0532  NA 135 133*  K 3.5 3.6  CL 99 99  CO2 29 25  GLUCOSE 169* 167*  BUN 14 23  CREATININE 1.01 1.63*  CALCIUM 9.1 8.3*  GFRNONAA >60 43*  PROT 6.6  --   ALBUMIN 4.0  --   AST 17  --   ALT 16  --   ALKPHOS 49  --   BILITOT 0.9  --    Iron/TIBC/Ferritin/ %Sat No results found for: IRON, TIBC,  FERRITIN, IRONPCTSAT    RADIOGRAPHIC STUDIES: I have personally reviewed the radiological images as listed and agreed with the findings in the report. No results found.    ASSESSMENT & PLAN:  1. Cancer of kidney, left ([place])   2. Goals of care, counseling/discussion   Cancer Staging Cancer of kidney, left [facility]) Staging form: Kidney, AJCC 8th Edition - Clinical stage from 06/25/2021: Stage III (cT3a, cNX, cM0) - Signed by [name], MD on 08/06/2021  #Stage III left RCC Pathology report was discussed with patient Patient has pT3 disease, intermediate high risk for recurrence. Discussed about the data from clinical trial keynote 564. Keytruda improved DFS compared to placebo group, overall survival benefit was not significantly different. We discussed about the rationale of adjuvant Keytruda every 3 weeks up to 1 year and also discussed the potential side effects of immunotherapy related complications. Patient would like to consider and call me back if he agrees with proceeding with adjuvant Keytruda treatments. If he decides not to proceed with any adjuvant treatment, active surveillance is another option for him.  Urology has ordered surveillance CT scan.  All questions were answered. The patient knows to call the clinic with any problems questions or concerns.   Billey Co, MD    Return of visit: TBD.  Depending on patient's decision on adjuvant treatment. Thank you for this kind referral and the opportunity to participate in the care of this patient. A copy of today's note is routed to referring provider    [name], MD, PhD Hematology Oncology [place] at [facility]  08/06/2021

## 2021-08-06 NOTE — Progress Notes (Signed)
Left Nephrectomy performed on 06/25/21. Hx prostate cancer and skin cancer in past. Here today to discuss possible immunotherapy/ treatment options. Appetite is good.Feels well. Stamina is slightly lower.

## 2021-08-07 ENCOUNTER — Ambulatory Visit
Admission: RE | Admit: 2021-08-07 | Discharge: 2021-08-07 | Disposition: A | Discharge status: Home / Self Care | Payer: Medicare Other | Source: Ambulatory Visit | Attending: Nephrology | Admitting: Nephrology

## 2021-08-07 DIAGNOSIS — N1832 Chronic kidney disease, stage 3b: Secondary | ICD-10-CM

## 2021-08-16 ENCOUNTER — Encounter: Payer: Self-pay | Admitting: General Surgery

## 2021-08-17 NOTE — Progress Notes (Signed)
[facility] [place], [place] [address]  Pulmonary Sleep Medicine   Office Visit Note  Patient Name: Peter Stuart DOB: 1946-01-10 MRN [id number]    Chief Complaint: Obstructive Sleep Apnea visit  Brief History:  Peter Stuart is seen today for one year follow up The patient has a 12 year history of sleep apnea. Patient is using PAP nightly @ 5-15cmH2O.  The patient feels fine after sleeping with PAP.  The patient reports benefitting from PAP use. Reported sleepiness is  resolved and the Epworth Sleepiness Score is 4 out of 24. The patient does take unplanned naps approx 15 -20 minutes without CPAP naps. The patient complains of the following: sinus passages swelling when he uses Flonase at night,but resolved when he changed to 2 sprays every morn instead of one spray at night.  The compliance download shows  compliance with an average use time of 7:10 hour @ 99%s. The AHI is 3.7  The patient does not complain of limb movements disrupting sleep.  ROS  General: (-) fever, (-) chills, (-) night sweat Nose and Sinuses: (-) nasal stuffiness or itchiness, (-) postnasal drip, (-) nosebleeds, (-) sinus trouble. Mouth and Throat: (-) sore throat, (-) hoarseness. Neck: (-) swollen glands, (-) enlarged thyroid, (-) neck pain. Respiratory: - cough, - shortness of breath, - wheezing. Neurologic: - numbness, - tingling. Psychiatric: - anxiety, - depression   Current Medication: Outpatient Encounter Medications as of 08/20/2021  Medication Sig   ezetimibe (ZETIA) 10 MG tablet Take by mouth.   fluticasone (FLONASE) 50 MCG/ACT nasal spray Place into the nose.   hydrochlorothiazide (HYDRODIURIL) 25 MG tablet Take by mouth.   ALLEGRA ALLERGY 60 MG tablet Take 60 mg by mouth daily.   cholecalciferol (VITAMIN D3) 25 MCG (1000 UNIT) tablet Take 1,000 Units by mouth daily.   clotrimazole (LOTRIMIN) 1 % cream Apply topically 2 (two) times daily.   Coenzyme Q10 10 MG capsule Take 10  mg by mouth daily.   ezetimibe (ZETIA) 10 MG tablet Take 10 mg by mouth every Monday, Wednesday, and Friday.   fluticasone (FLONASE) 50 MCG/ACT nasal spray Place 1 spray into the nose in the morning and at bedtime.   gabapentin (NEURONTIN) 400 MG capsule Take 400 mg by mouth at bedtime.   hydrochlorothiazide (HYDRODIURIL) 25 MG tablet Take 25 mg by mouth daily.   losartan (COZAAR) 100 MG tablet Take 100 mg by mouth daily.   Multiple Vitamin (MULTIVITAMIN) capsule Take 1 capsule by mouth daily.   terbinafine (LAMISIL) 1 % cream Apply 1 application topically daily as needed (irritation).   No facility-administered encounter medications on file as of 08/20/2021.    Surgical History: Past Surgical History:  Procedure Laterality Date   HERNIA REPAIR     LAPAROSCOPIC APPENDECTOMY N/A 04/27/2021   Procedure: APPENDECTOMY LAPAROSCOPIC;  Surgeon: Fredirick Maudlin, MD;  Location: [facility] ORS;  Service: General;  Laterality: N/A;   LAPAROSCOPIC NEPHRECTOMY, HAND ASSISTED Left 06/25/2021   Procedure: HAND ASSISTED LAPAROSCOPIC NEPHRECTOMY;  Surgeon: Billey Co, MD;  Location: [facility] ORS;  Service: Urology;  Laterality: Left;   prostatectomy     REPAIR KNEE LIGAMENT     SHOULDER ARTHROSCOPY WITH SUBACROMIAL DECOMPRESSION AND OPEN ROTATOR C Right 12/29/2020   Procedure: Right shoulder arthroscopic rotator cuff repair, subacromial decompression, and biceps tenodesis;  Surgeon: Leim Fabry, MD;  Location: [place] [facility];  Service: Orthopedics;  Laterality: Right;   TONSILLECTOMY     VASECTOMY      Medical History: Past  Medical History:  Diagnosis Date   Anemia    Cancer ([place])    Complication of anesthesia    slow to wake after 1 surgery   CPAP use counseling    HTN (hypertension)    OSA on CPAP    Prostate cancer (HCC)    Seasonal allergies    Skin cancer     Family History: Non contributory to the present illness  Social History: Social History   Socioeconomic History   Marital  status: Widowed    Spouse name: Not on file   Number of children: Not on file   Years of education: Not on file   Highest education level: Not on file  Occupational History   Not on file  Tobacco Use   Smoking status: Never   Smokeless tobacco: Never  Vaping Use   Vaping Use: Never used  Substance and Sexual Activity   Alcohol use: Not Currently   Drug use: Never   Sexual activity: Not Currently  Other Topics Concern   Not on file  Social History Narrative   Not on file   Social Determinants of Health   Financial Resource Strain: Not on file  Food Insecurity: Not on file  Transportation Needs: Not on file  Physical Activity: Not on file  Stress: Not on file  Social Connections: Not on file  Intimate Partner Violence: Not on file    Vital Signs: Blood pressure 118/70, pulse 82, resp. rate 16, height 5\' 10"  (1.778 m), weight 242 lb (109.8 kg), SpO2 96 %.  Examination: General Appearance: The patient is well-developed, well-nourished, and in no distress. Neck Circumference: 46 cm Skin: Gross inspection of skin unremarkable. Head: normocephalic, no gross deformities. Eyes: no gross deformities noted. ENT: ears appear grossly normal Neurologic: Alert and oriented. No involuntary movements.    EPWORTH SLEEPINESS SCALE:  Scale:  (0)= no chance of dozing; (1)= slight chance of dozing; (2)= moderate chance of dozing; (3)= high chance of dozing  Chance  Situtation    Sitting and reading: 2    Watching TV: 2    Sitting Inactive in public: 0    As a passenger in car: 0      Lying down to rest: 0    Sitting and talking: 0    Sitting quielty after lunch: 0    In a car, stopped in traffic: 0   TOTAL SCORE:   4 out of 24    SLEEP STUDIES:  Split 02/09/09 AHI 42 SpO27min 75%, CPAP 10 cm H2O   CPAP COMPLIANCE DATA:  Date Range: 08/16/20 - 08/15/21  Average Daily Use: 7:10 hours  Median Use: 7:17 hours  Compliance for > 4 Hours: 99% days  AHI: 3.7  respiratory events per hour  Days Used: 364/365  Mask Leak: 39.9 lpm  95th Percentile Pressure: 11 cmH2O         LABS: Recent Results (from the past 2160 hour(s))  Type and screen [place]     Status: None   Collection Time: 06/18/21 11:40 AM  Result Value Ref Range   ABO/RH(D) B POS    Antibody Screen NEG    Sample Expiration 07/02/2021,2359    Extend sample reason      NO TRANSFUSIONS OR PREGNANCY IN THE PAST 3 MONTHS Performed at [facility], [address] [place], [place] [address]   SARS CORONAVIRUS 2 (TAT 6-24 HRS) Nasopharyngeal Nasopharyngeal Swab     Status: None   Collection Time: 06/21/21  11:27 AM   Specimen: Nasopharyngeal Swab  Result Value Ref Range   SARS Coronavirus 2 NEGATIVE NEGATIVE    Comment: (NOTE) SARS-CoV-2 target nucleic acids are NOT DETECTED.  The SARS-CoV-2 RNA is generally detectable in upper and lower respiratory specimens during the acute phase of infection. Negative results do not preclude SARS-CoV-2 infection, do not rule out co-infections with other pathogens, and should not be used as the sole basis for treatment or other patient management decisions. Negative results must be combined with clinical observations, patient history, and epidemiological information. The expected result is Negative.  Fact Sheet for Patients: SugarRoll.be  Fact Sheet for Healthcare Providers: https://www.woods-mathews.com/  This test is not yet approved or cleared by the [place] FDA and  has been authorized for detection and/or diagnosis of SARS-CoV-2 by FDA under an Emergency Use Authorization (EUA). This EUA will remain  in effect (meaning this test can be used) for the duration of the COVID-19 declaration under Se ction 564(b)(1) of the Act, 21 U.S.C. section 360bbb-3(b)(1), unless the authorization is terminated or revoked sooner.  Performed at [facility], [place] [address] [place], [place] [address]   ABO/Rh     Status: None   Collection Time: 06/25/21  8:52 AM  Result Value Ref Range   ABO/RH(D)      B POS Performed at [facility], [address] [place], [place] [address]   Surgical pathology     Status: None   Collection Time: 06/25/21 11:18 AM  Result Value Ref Range   SURGICAL PATHOLOGY      SURGICAL PATHOLOGY CASE: [id number] PATIENT: Elvis Coil Surgical Pathology Report     Specimen Submitted: A. Kidney, left, proximal ureter  Clinical History: Renal mass    DIAGNOSIS: A. 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 - BENIGN ADRENAL TISSUE. - SEE CANCER SUMMARY.  CANCER CASE SUMMARY: KIDNEY Standard(s): AJCC-UICC 8  SPECIMEN Procedure: Total nephrectomy Specimen Laterality: Left  TUMOR Tumor Focality: Unifocal Tumor Size: Greatest dimension 7.7 cm Histologic Type: Clear-cell renal cell carcinoma Histologic Grade (WHO / ISUP Grade): G2 Tumor Extent: Extends into renal sinus Sarcomatoid Features: Not identified Rhabdoid Features: Not identified Tumor Necrosis: Not identified Lymphovascular Invasion: Present  MARGINS Margin Status: All margins negative for invasive carcinoma  REGIONAL LYMPH NODES Regional Lymph Node Status: Not applicable (no regional lymph  nodes submitted or found)  DISTANT METASTASIS Distant Site(s) Involved, if applicable: Not applicable  PATHOLOGIC STAGE CLASSIFICATION (pTNM, AJCC 8th Edition): TNM Descriptors: Not applicable CX4G pN not assigned (no nodes submitted or found) pM - Not applicable  ADDITIONAL FINDINGS Additional Pathologic Findings in Non-neoplastic Kidney: None identified  GROSS DESCRIPTION: A. Labeled: Left kidney and proximal ureter Received: Formalin Collection time: 11:18 AM on 06/25/2021 Placed into formalin time: 11:21 AM on 06/25/2021 Type of  procedure: Laparoscopic nephrectomy Laterality: Left Weight of specimen: 898 grams Size of specimen: Overall: 21 x 11.5 x 7.5 cm; kidney: 13 x 6.7 x 5.7 cm; ureter: 12 cm long by 0.4 cm in diameter Orientation: Unoriented; fascia = black, perirenal fat pad = green Presence/absence of adrenal gland: Present Number of masses: 1 Size(s) of mass(es): 7.7 x 6.4 x 4.3 cm Location of mass(es): Superior pole Description of tumor: The ma ss is yellow-tan and lobulated with scattered areas of hemorrhage.  The hemorrhage comprises about 10% of the mass. There are focal associated areas suspicious for, but not definitive of necrosis.  Extent  of invasion:      Peri-nephric tissue (beyond renal capsule): None grossly identified.      Renal sinus: The mass is within the renal sinus.      Beyond Gerota's fascia: None grossly identified.       Major vein: None grossly identified.      Perihilar fat: None grossly identified.                        Pelvicalyceal system: The mass is at least abutting the calyces.      Adrenal gland: None grossly identified.      Other organs: None grossly identified Margins: Ureter margin: 10.5 cm; vascular margin: 0.9 cm; Gerota's fascia: 1.2 cm; perihilar fat: 1.4 cm; capsule: Less than 0.1 cm, adrenal gland: 1.7 cm Description of kidney away from tumor: The remainder of the parenchyma is tan and grossly unremarkable with a distinct corticomedullary junction.  The cortex i s up to 1 cm and the medulla is up to 1.1 cm.  Lymph nodes: None grossly identified. Comments: There is an attached disrupted adrenal gland, 5.5 x 1 x 0.2 cm and 4.2 x 1.6 x 0.2 cm.  The cortex is yellow-orange and the medulla is tan.  No distinct masses or lesions are grossly identified.  The surrounding adipose tissue is grossly unremarkable.  The attached ureter has grossly unremarkable urothelium.  Block summary: 1 - ureter resection margin, en face 2 - vascular margins, en face 3 -  10 - representative section of mass (1/cm)      3 - 4 - mass with extension into renal sinus      5 - 8 - mass to calyces      9 - mass with closest approach to capsule      10 - mass to adjacent normal parenchyma 11 - closest perirenal fat margin and Gerota's fascia to mass 12 - representative grossly normal inferior pole parenchyma 13 - representative adrenal gland closest to mass 14 - 18 - perihilar adipose tissue to aid in the identification of lymph nodes  RB 06/26/2021  Fin al Diagnosis performed by Allena Napoleon, MD.   Electronically signed 06/28/2021 1:51:00PM The electronic signature indicates that the named Attending Pathologist has evaluated the specimen Technical component performed at [facility], [address] [place], [place] [address] Lab: [phone] Dir: Rush Farmer, MD, MMM  Professional component performed at [facility], [facility], [place], [place], [place] [address] Lab: [phone] Dir: Dellia Nims. Rubinas, MD   CBC     Status: Abnormal   Collection Time: 06/26/21  5:32 AM  Result Value Ref Range   WBC 13.7 (H) 4.0 - 10.5 K/uL   RBC 4.35 4.22 - 5.81 MIL/uL   Hemoglobin 13.6 13.0 - 17.0 g/dL   HCT 40.0 39.0 - 52.0 %   MCV 92.0 80.0 - 100.0 fL   MCH 31.3 26.0 - 34.0 pg   MCHC 34.0 30.0 - 36.0 g/dL   RDW 13.0 11.5 - 15.5 %   Platelets 176 150 - 400 K/uL   nRBC 0.0 0.0 - 0.2 %    Comment: Performed at [facility], [address] [place],  [address]  Basic metabolic panel     Status: Abnormal   Collection Time: 06/26/21  5:32 AM  Result Value Ref Range   Sodium 133 (L) 135 - 145 mmol/L   Potassium 3.6 3.5 - 5.1 mmol/L   Chloride 99 98 - 111 mmol/L  CO2 25 22 - 32 mmol/L   Glucose, Bld 167 (H) 70 - 99 mg/dL    Comment: Glucose reference range applies only to samples taken after fasting for at least 8 hours.   BUN 23 8 - 23 mg/dL   Creatinine, Ser 1.63 (H) 0.61 - 1.24 mg/dL   Calcium 8.3 (L) 8.9 - 10.3 mg/dL   GFR,  Estimated 43 (L) >60 mL/min    Comment: (NOTE) Calculated using the CKD-EPI Creatinine Equation (2021)    Anion gap 9 5 - 15    Comment: Performed at [facility], [address] [place], [place] [address]    Radiology: US RENAL  Result Date: 08/09/2021 CLINICAL DATA:  Stage IIIB chronic kidney disease. Left nephrectomy 06/25/2021 for renal cell carcinoma EXAM: RENAL / URINARY TRACT ULTRASOUND COMPLETE COMPARISON:  CT abdomen pelvis 04/27/2021 FINDINGS: Right Kidney: Renal measurements: 11.5 x 4.4 x 4.4 cm = volume: 118 mL. Echogenicity within normal limits. No mass or hydronephrosis visualized. Left Kidney: Surgically absent. No mass or fluid collection in the left renal bed. Bladder: Appears normal for degree of bladder distention. Other: None. IMPRESSION: Postop left nephrectomy.  Normal right kidney. Electronically Signed   By: Franchot Gallo M.D.   On: 08/09/2021 13:52    No results found.  US RENAL  Result Date: 08/09/2021 CLINICAL DATA:  Stage IIIB chronic kidney disease. Left nephrectomy 06/25/2021 for renal cell carcinoma EXAM: RENAL / URINARY TRACT ULTRASOUND COMPLETE COMPARISON:  CT abdomen pelvis 04/27/2021 FINDINGS: Right Kidney: Renal measurements: 11.5 x 4.4 x 4.4 cm = volume: 118 mL. Echogenicity within normal limits. No mass or hydronephrosis visualized. Left Kidney: Surgically absent. No mass or fluid collection in the left renal bed. Bladder: Appears normal for degree of bladder distention. Other: None. IMPRESSION: Postop left nephrectomy.  Normal right kidney. Electronically Signed   By: Franchot Gallo M.D.   On: 08/09/2021 13:52      Assessment and Plan: Patient Active Problem List   Diagnosis Date Noted   CPAP use counseling 08/20/2021   Obesity (BMI 30-39.9) 08/20/2021   Type II or unspecified type diabetes mellitus without mention of complication, not stated as uncontrolled 07/30/2021   CKD (chronic kidney disease) stage 3, GFR 30-59 ml/min ([place])  07/19/2021   History of left nephrectomy 07/08/2021   Cancer of kidney, left ([place]) 06/25/2021   Acute appendicitis 04/27/2021   Pain and swelling of lower leg, right 03/05/2021   Prostate cancer ([place]) 03/05/2021   DNR (do not resuscitate) 06/13/2020   Medicare annual wellness visit, subsequent 06/13/2020   Hip pain, bilateral 03/03/2020   Status post rotator cuff surgery 03/03/2020   Type 2 diabetes mellitus with hyperlipidemia ([place]) 02/16/2020   Obesity, morbid ([place]) 02/14/2020   OSA on CPAP 10/15/2019   Status post prostatectomy 06/19/2017   Polyneuropathy 09/17/2016   B12 deficiency 08/13/2016   Lumbar radiculopathy 06/12/2016   Mixed hyperlipidemia 11/30/2015   Essential hypertension 04/26/2015   Memory loss 02/18/2015   History of squamous cell carcinoma of skin 02/15/2015    1. OSA on CPAP The patient does tolerate PAP and reports  benefit from PAP use. The patient was reminded how to clean equipment  and advised to replace supplies routinely- he has not been doing that consistently. This may be part of his leak. Will offer mask fit appointment. The patient was also counselled on weight loss. The compliance is excellent. The AHI is 3.7.   OSA- continue excellent compliance with pap. F/u one year.  Mask fit appointment,   2. CPAP use counseling CPAP Counseling: had a lengthy discussion with the patient regarding the importance of PAP therapy in management of the sleep apnea. Patient appears to understand the risk factor reduction and also understands the risks associated with untreated sleep apnea. Patient will try to make a good faith effort to remain compliant with therapy. Also instructed the patient on proper cleaning of the device including the water must be changed daily if possible and use of distilled water is preferred. Patient understands that the machine should be regularly cleaned with appropriate recommended cleaning solutions that do not damage the PAP machine for  example given white vinegar and water rinses. Other methods such as ozone treatment may not be as good as these simple methods to achieve cleaning.   3. Essential hypertension Hypertension Counseling:   The following hypertensive lifestyle modification were recommended and discussed:  1. Limiting alcohol intake to less than 1 oz/day of ethanol:(24 oz of beer or 8 oz of wine or 2 oz of 100-proof whiskey). 2. Take baby ASA 81 mg daily. 3. Importance of regular aerobic exercise and losing weight. 4. Reduce dietary saturated fat and cholesterol intake for overall cardiovascular health. 5. Maintaining adequate dietary potassium, calcium, and magnesium intake. 6. Regular monitoring of the blood pressure. 7. Reduce sodium intake to less than 100 mmol/day (less than 2.3 gm of sodium or less than 6 gm of sodium choride)    4. Obesity (BMI 30-39.9) Obesity Counseling: Had a lengthy discussion regarding patients BMI and weight issues. Patient was instructed on portion control as well as increased activity. Also discussed caloric restrictions with trying to maintain intake less than 2000 Kcal. Discussions were made in accordance with the 5As of weight management. Simple actions such as not eating late and if able to, taking a walk is suggested.    General Counseling: I have discussed the findings of the evaluation and examination with Marcello Moores.  I have also discussed any further diagnostic evaluation thatmay be needed or ordered today. Dylon verbalizes understanding of the findings of todays visit. We also reviewed his medications today and discussed drug interactions and side effects including but not limited excessive drowsiness and altered mental states. We also discussed that there is always a risk not just to him but also people around him. he has been encouraged to call the office with any questions or concerns that should arise related to todays visit.  No orders of the defined types were placed in this  encounter.       I have personally obtained a history, examined the patient, evaluated laboratory and imaging results, formulated the assessment and plan and placed orders. This patient was seen today by Tressie Ellis, PA-C in collaboration with Dr. Devona Konig.   Allyne Gee, MD [facility] Diplomate ABMS Pulmonary Critical Care Medicine and Sleep Medicine

## 2021-08-20 ENCOUNTER — Ambulatory Visit (INDEPENDENT_AMBULATORY_CARE_PROVIDER_SITE_OTHER): Payer: Medicare Other | Admitting: Internal Medicine

## 2021-08-20 ENCOUNTER — Other Ambulatory Visit: Payer: Self-pay

## 2021-08-20 VITALS — BP 118/70 | HR 82 | Resp 16 | Ht 70.0 in | Wt 242.0 lb

## 2021-08-20 DIAGNOSIS — I10 Essential (primary) hypertension: Secondary | ICD-10-CM

## 2021-08-20 DIAGNOSIS — Z7189 Other specified counseling: Secondary | ICD-10-CM

## 2021-08-20 DIAGNOSIS — G4733 Obstructive sleep apnea (adult) (pediatric): Secondary | ICD-10-CM

## 2021-08-20 DIAGNOSIS — E669 Obesity, unspecified: Secondary | ICD-10-CM

## 2021-08-20 DIAGNOSIS — Z9989 Dependence on other enabling machines and devices: Secondary | ICD-10-CM

## 2021-08-20 NOTE — Patient Instructions (Signed)

## 2021-11-13 ENCOUNTER — Telehealth: Payer: Self-pay | Admitting: Urology

## 2021-11-13 NOTE — Telephone Encounter (Signed)
Scheduling called office stating when they tried to call pt to schedule CT scan, pt didn't understand why he had to have this done.  Can you please give pt a call to explain.

## 2021-11-13 NOTE — Telephone Encounter (Signed)
Called pt reiterated information given to patient per Dr. Doristine Counter last office note. Pt voiced understanding and desires to proceed with imaging. He does not that he has memory issues and was likely given this information below.

## 2021-11-29 ENCOUNTER — Ambulatory Visit
Admission: RE | Admit: 2021-11-29 | Discharge: 2021-11-29 | Disposition: A | Discharge status: Home / Self Care | Payer: Medicare Other | Source: Ambulatory Visit | Attending: Urology | Admitting: Urology

## 2021-11-29 ENCOUNTER — Other Ambulatory Visit: Payer: Self-pay

## 2021-11-29 DIAGNOSIS — Z85528 Personal history of other malignant neoplasm of kidney: Secondary | ICD-10-CM | POA: Insufficient documentation

## 2021-11-29 LAB — POCT I-STAT CREATININE: Creatinine, Ser: 1.9 mg/dL — ABNORMAL HIGH (ref 0.61–1.24)

## 2021-11-29 MED ORDER — IOHEXOL 300 MG/ML  SOLN
100.0000 mL | Freq: Once | INTRAMUSCULAR | Status: AC | PRN
  Administered 2021-11-29: 80 mL via INTRAVENOUS

## 2021-12-25 ENCOUNTER — Encounter: Payer: Self-pay | Admitting: Urology

## 2021-12-25 ENCOUNTER — Other Ambulatory Visit: Payer: Self-pay

## 2021-12-25 ENCOUNTER — Ambulatory Visit (INDEPENDENT_AMBULATORY_CARE_PROVIDER_SITE_OTHER): Payer: Medicare Other | Admitting: Urology

## 2021-12-25 VITALS — BP 114/72 | HR 73 | Ht 69.0 in | Wt 242.0 lb

## 2021-12-25 DIAGNOSIS — C61 Malignant neoplasm of prostate: Secondary | ICD-10-CM | POA: Diagnosis not present

## 2021-12-25 DIAGNOSIS — C642 Malignant neoplasm of left kidney, except renal pelvis: Secondary | ICD-10-CM | POA: Diagnosis not present

## 2021-12-25 NOTE — Progress Notes (Signed)
° °  12/25/2021 10:01 AM   Peter Stuart Jun 04, 1946 [id number]  Reason for visit: Follow up left RCC, prostate cancer, CKD  HPI: 76 year old male with history of prostate cancer treated with robotic prostatectomy in [place] in 2009 and undetectable PSA since that time who was found to have an 8 cm central left renal mass and underwent an uncomplicated left hand-assisted laparoscopic radical nephrectomy on 06/25/2021, with pathology showing renal cell carcinoma, Fuhrman grade 2, stage pT3a with negative margins.  He denies any problems over the last 6 months.  He continues to have mild stress incontinence that has been present since his robotic prostatectomy in 2009.  We reviewed the AUA guidelines that do not recommend ongoing PSA screening greater than 10 years out from definitive treatment.  He is tolerating a general diet, he is working on losing some weight.  Most recent creatinine 1.9.  I personally viewed and interpreted the CT chest abdomen and pelvis from 11/29/2021 that shows no evidence of recurrence.  He met with oncology to consider adjuvant treatment, and deferred.  -With his age, observation is certainly reasonable.  We reviewed the guidelines that recommend CT of the chest, abdomen, and pelvis every 6 months for the next 5 years.  He understands the risk of recurrence and surveillance moving forward. -We also discussed a trial of a Cunningham/Weisner clamp for his persistent stress incontinence from history of prostatectomy in [place] in 2009  [facility], [place] [address] [place],  [address] [phone]

## 2021-12-25 NOTE — Patient Instructions (Signed)
You can order a Cunningham/Weisner clamp from [email or web address] that can help with incontinence after prostate surgery.  This can be used when physically active and having increased leakage to decrease the need for pads.

## 2022-02-13 DIAGNOSIS — R2689 Other abnormalities of gait and mobility: Secondary | ICD-10-CM | POA: Insufficient documentation

## 2022-02-13 DIAGNOSIS — M25511 Pain in right shoulder: Secondary | ICD-10-CM | POA: Insufficient documentation

## 2022-02-13 DIAGNOSIS — M7989 Other specified soft tissue disorders: Secondary | ICD-10-CM | POA: Insufficient documentation

## 2022-05-29 ENCOUNTER — Ambulatory Visit
Admission: RE | Admit: 2022-05-29 | Discharge: 2022-05-29 | Disposition: A | Discharge status: Home / Self Care | Payer: Medicare Other | Source: Ambulatory Visit | Attending: Urology | Admitting: Urology

## 2022-05-29 DIAGNOSIS — C642 Malignant neoplasm of left kidney, except renal pelvis: Secondary | ICD-10-CM | POA: Insufficient documentation

## 2022-05-29 MED ORDER — IOHEXOL 300 MG/ML  SOLN
75.0000 mL | Freq: Once | INTRAMUSCULAR | Status: AC | PRN
  Administered 2022-05-29: 75 mL via INTRAVENOUS

## 2022-05-30 ENCOUNTER — Telehealth: Payer: Self-pay | Admitting: Urology

## 2022-05-30 ENCOUNTER — Telehealth: Payer: Self-pay

## 2022-05-30 NOTE — Telephone Encounter (Signed)
76 year old male with history of distant prostate cancer treated with robotic prostatectomy in [place] in 2009 and undetectable PSA since that time, as well as history of renal cell carcinoma 8 cm central left-sided mass status post left hand-assisted laparoscopic radical nephrectomy 06/25/2021, stage pT3a with negative margins.  Routine surveillance CT of the chest abdomen and pelvis from 05/29/2022 with new lytic lesions in L2 vertebral body and intertrochanteric left femur most consistent with osseous metastatic disease.  I reviewed these results with the patient over the phone today, and need for referral back to oncology to consider adjuvant treatments including immunotherapy, potential radiation, potential referral to orthopedics.  He denies any low back or left-sided hip/leg pain.  -I messaged Dr. Tasia Catchings with oncology to coordinate follow-up, he saw her previously in October 2022 and deferred adjuvant immunotherapy -Keep follow-up as scheduled next month with urology  Peter Madrid, MD 05/30/2022

## 2022-05-30 NOTE — Telephone Encounter (Signed)
[place] please schedule pt for MD only and inform pt of appt.

## 2022-05-30 NOTE — Telephone Encounter (Signed)
-----   Message from [name], MD sent at 05/30/2022  3:26 PM EDT ----- Regarding: RE: recurrent RCC patient Dr.Sninsky,  Thanks for reaching out. He was seen by me last year and lost follow up.  I will ask our office to schedule him a visit to discuss his options.   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  Routine surveillance CT of the chest abdomen and pelvis from 05/29/2022 with new lytic lesions in L2 vertebral body and intertrochanteric left femur most consistent with osseous metastatic disease.    I called to update him on concern for recurrence on CT findings, he is not having any pain at this time.  Can you get him set up to see you again to discuss immunotherapy and other options?  Thanks  [name], MD 05/30/2022

## 2022-06-03 ENCOUNTER — Inpatient Hospital Stay: Payer: Medicare Other | Attending: Oncology | Admitting: Oncology

## 2022-06-03 ENCOUNTER — Other Ambulatory Visit: Payer: Self-pay

## 2022-06-03 ENCOUNTER — Inpatient Hospital Stay: Payer: Medicare Other

## 2022-06-03 ENCOUNTER — Encounter: Payer: Self-pay | Admitting: Oncology

## 2022-06-03 VITALS — BP 128/77 | HR 65 | Temp 98.0°F | Resp 20 | Wt 248.5 lb

## 2022-06-03 DIAGNOSIS — Z79899 Other long term (current) drug therapy: Secondary | ICD-10-CM | POA: Insufficient documentation

## 2022-06-03 DIAGNOSIS — Z8546 Personal history of malignant neoplasm of prostate: Secondary | ICD-10-CM

## 2022-06-03 DIAGNOSIS — M549 Dorsalgia, unspecified: Secondary | ICD-10-CM | POA: Diagnosis not present

## 2022-06-03 DIAGNOSIS — M899 Disorder of bone, unspecified: Secondary | ICD-10-CM

## 2022-06-03 DIAGNOSIS — Z905 Acquired absence of kidney: Secondary | ICD-10-CM | POA: Insufficient documentation

## 2022-06-03 DIAGNOSIS — Z7189 Other specified counseling: Secondary | ICD-10-CM

## 2022-06-03 DIAGNOSIS — M898X9 Other specified disorders of bone, unspecified site: Secondary | ICD-10-CM

## 2022-06-03 DIAGNOSIS — C642 Malignant neoplasm of left kidney, except renal pelvis: Secondary | ICD-10-CM

## 2022-06-03 DIAGNOSIS — C61 Malignant neoplasm of prostate: Secondary | ICD-10-CM | POA: Diagnosis not present

## 2022-06-03 LAB — CBC
HCT: 43.1 % (ref 39.0–52.0)
Hemoglobin: 14.6 g/dL (ref 13.0–17.0)
MCH: 30.9 pg (ref 26.0–34.0)
MCHC: 33.9 g/dL (ref 30.0–36.0)
MCV: 91.1 fL (ref 80.0–100.0)
Platelets: 212 10*3/uL (ref 150–400)
RBC: 4.73 MIL/uL (ref 4.22–5.81)
RDW: 13 % (ref 11.5–15.5)
WBC: 6.9 10*3/uL (ref 4.0–10.5)
nRBC: 0 % (ref 0.0–0.2)

## 2022-06-03 LAB — PSA: Prostatic Specific Antigen: <0.01 ng/mL (ref 0.00–4.00)

## 2022-06-03 LAB — LACTATE DEHYDROGENASE: LDH: 130 U/L (ref 98–192)

## 2022-06-03 NOTE — Assessment & Plan Note (Signed)
Discussed with patient

## 2022-06-03 NOTE — Progress Notes (Signed)
Hematology/Oncology Progress note [phone] [phone]      Patient Care Team: Dion Body, MD as PCP - General (Family Medicine)  ASSESSMENT & PLAN:   Cancer of kidney, left [facility]) History of stage III left RCC, recent CT scan findings were reviewed and discussed with patient Lytic bone lesion of L2 and intertrochanteric left femur, consistent with osseous metastasis. With patient's remote history of prostate cancer, I will check PSA -<0.01 Check multiple myeloma panel, light chain ratio, 24-hour urine protein electrophoresis to rule out other etiologies of lytic lesion. Discussed with patient about possibility of osseous metastasis secondary to left RCC. Recommend bone scan for further evaluation We discussed about systemic immunotherapy with Keytruda +/- bone radiation.  Rationale and potential side effects were reviewed and discussed with patient.  His treatment plan will be finalized after bone scan, tumor board discussion.   Prostate cancer ([place]) PSA is less than 0.01.  Bone lesions are less likely secondary to prostate cancer  Goals of care, counseling/discussion Discussed with patient.  Orders Placed This Encounter  Procedures   PSA    Standing Status:   Future    Number of Occurrences:   1    Standing Expiration Date:   06/04/2023   Lactate dehydrogenase    Standing Status:   Future    Number of Occurrences:   1    Standing Expiration Date:   06/04/2023   Kappa/lambda light chains    Standing Status:   Future    Number of Occurrences:   1    Standing Expiration Date:   06/04/2023   Multiple Myeloma Panel (SPEP&IFE w/QIG)    Standing Status:   Future    Number of Occurrences:   1    Standing Expiration Date:   06/04/2023   IFE+PROTEIN ELECTRO, 24-HR UR    Standing Status:   Future    Standing Expiration Date:   06/04/2023   Follow up TBD All questions were answered. The patient knows to call the clinic with any problems, questions or  concerns.  [name], MD, PhD [facility] Hematology Oncology 06/03/2022   CHIEF COMPLAINTS/REASON FOR VISIT:  RCC  HISTORY OF PRESENTING ILLNESS:   Peter Stuart is a  76 y.o.  male presents for follow-up of history of left RCC and history of prostate cancer. Oncology history summary listed as below. Oncology History  Cancer of kidney, left ([place])  2009 Initial Diagnosis   History of prostate cancer treated with robotic prostatectomy initial   04/27/2021 Imaging   CT abdomen pelvis showed acute appendicitis without evidence of perforation or abscess.  Large left renal mass consistent with renal malignancy with renal vein invasion.  Focal annular thickening of descending colon.  05/01/2021 CT chest with contrast showed large sliding-type hiatal hernia.  Mild bilateral posterior bibasilar subsegmental atelectasis   06/25/2021 Initial Diagnosis   Cancer of kidney, left  06/25/2021, patient underwent radical nephrectomy by Dr. Diamantina Providence. Pathology showed clear-cell renal cell carcinoma, nuclear grade 2.  Benign adrenal tissue.  Lymphovascular invasion present. pT3a pNx    06/25/2021 Cancer Staging   Staging form: Kidney, AJCC 8th Edition - Clinical stage from 06/25/2021: Stage III (cT3a, cNX, cM0) - Signed by [name], MD on 08/06/2021 Stage prefix: Initial diagnosis   05/29/2022 Imaging   CT chest abdomen pelvis with contrast showed -1. New lytic lesions in the L2 vertebral body and intertrochanteric left femur are most consistent with osseous metastatic disease. 2. Prior left nephrectomy with unchanged size of  the soft tissue and fluid collection in the nephrectomy bed favored to reflect postsurgical change given stability. Continued attention on follow-up imaging suggested. 3. Small lucent focus in the T11 vertebral body is nonspecific but stable from prior imaging suggestive of a benign etiology, attention on follow-up imaging suggested. 4. Stable small bilateral pulmonary nodules, continued  attention on follow-up imaging suggested. 5. Large hiatal hernia/intrathoracic stomach also containing a portion of the pancreatic body. 6. Prior prostatectomy without suspicious enhancing nodularity in the prostatectomy bed.    Patient was referred back to establish care.  He denies any bone pain currently.  Accompanied by daughter [name].     Review of Systems  Constitutional:  Negative for appetite change, chills, fever and unexpected weight change.  HENT:   Negative for hearing loss and voice change.   Eyes:  Negative for eye problems and icterus.  Respiratory:  Negative for chest tightness, cough and shortness of breath.   Cardiovascular:  Negative for chest pain and leg swelling.  Gastrointestinal:  Negative for abdominal distention and abdominal pain.  Endocrine: Negative for hot flashes.  Genitourinary:  Negative for difficulty urinating, dysuria and frequency.   Musculoskeletal:  Negative for arthralgias.  Skin:  Negative for itching and rash.  Neurological:  Negative for light-headedness and numbness.  Hematological:  Negative for adenopathy. Does not bruise/bleed easily.  Psychiatric/Behavioral:  Negative for confusion.     MEDICAL HISTORY:  Past Medical History:  Diagnosis Date   Anemia    Cancer ([place])    Complication of anesthesia    slow to wake after 1 surgery   CPAP use counseling    HTN (hypertension)    OSA on CPAP    Prostate cancer (H[facility])    Seasonal allergies    Skin cancer     SURGICAL HISTORY: Past Surgical History:  Procedure Laterality Date   HERNIA REPAIR     LAPAROSCOPIC APPENDECTOMY N/A 04/27/2021   Procedure: APPENDECTOMY LAPAROSCOPIC;  Surgeon: Fredirick Maudlin, MD;  Location: [facility] ORS;  Service: General;  Laterality: N/A;   LAPAROSCOPIC NEPHRECTOMY, HAND ASSISTED Left 06/25/2021   Procedure: HAND ASSISTED LAPAROSCOPIC NEPHRECTOMY;  Surgeon: Billey Co, MD;  Location: [facility] ORS;  Service: Urology;  Laterality: Left;   prostatectomy      REPAIR KNEE LIGAMENT     SHOULDER ARTHROSCOPY WITH SUBACROMIAL DECOMPRESSION AND OPEN ROTATOR C Right 12/29/2020   Procedure: Right shoulder arthroscopic rotator cuff repair, subacromial decompression, and biceps tenodesis;  Surgeon: Leim Fabry, MD;  Location: [facility];  Service: Orthopedics;  Laterality: Right;   TONSILLECTOMY     VASECTOMY      SOCIAL HISTORY: Social History   Socioeconomic History   Marital status: Widowed    Spouse name: Not on file   Number of children: Not on file   Years of education: Not on file   Highest education level: Not on file  Occupational History   Not on file  Tobacco Use   Smoking status: Never    Passive exposure: Never   Smokeless tobacco: Never  Vaping Use   Vaping Use: Never used  Substance and Sexual Activity   Alcohol use: Not Currently   Drug use: Never   Sexual activity: Not Currently  Other Topics Concern   Not on file  Social History Narrative   Not on file   Social Determinants of Health   Financial Resource Strain: Not on file  Food Insecurity: Not on file  Transportation Needs: Not on file  Physical Activity:  Not on file  Stress: Not on file  Social Connections: Not on file  Intimate Partner Violence: Not on file    FAMILY HISTORY: Family History  Problem Relation Age of Onset   Stroke Mother    Hypertension Father    Emphysema Father     ALLERGIES:  has No Known Allergies.  MEDICATIONS:  Current Outpatient Medications  Medication Sig Dispense Refill   ALLEGRA ALLERGY 60 MG tablet Take 60 mg by mouth daily.     cholecalciferol (VITAMIN D3) 25 MCG (1000 UNIT) tablet Take 1,000 Units by mouth daily.     clotrimazole (LOTRIMIN) 1 % cream Apply topically 2 (two) times daily.     Coenzyme Q10 10 MG capsule Take 10 mg by mouth daily.     ezetimibe (ZETIA) 10 MG tablet Take 1 tablet by mouth daily.     fluticasone (FLONASE) 50 MCG/ACT nasal spray Place into the nose.     gabapentin (NEURONTIN) 400 MG  capsule Take 400 mg by mouth at bedtime.     GINKGO BILOBA COMPLEX PO Take by mouth.     hydrochlorothiazide (HYDRODIURIL) 25 MG tablet Take by mouth.     losartan (COZAAR) 100 MG tablet Take 100 mg by mouth daily.     Multiple Vitamin (MULTIVITAMIN) capsule Take 1 capsule by mouth daily.     terbinafine (LAMISIL) 1 % cream Apply 1 application topically daily as needed (irritation).     No current facility-administered medications for this visit.     PHYSICAL EXAMINATION: ECOG PERFORMANCE STATUS: 0 - Asymptomatic Vitals:   06/03/22 1020  BP: 128/77  Pulse: 65  Resp: 20  Temp: 98 F (36.7 C)  SpO2: (!) 67%   Filed Weights   06/03/22 1020  Weight: 248 lb 8 oz (112.7 kg)    Physical Exam Constitutional:      General: He is not in acute distress.    Appearance: He is obese.  HENT:     Head: Normocephalic and atraumatic.  Eyes:     General: No scleral icterus. Cardiovascular:     Rate and Rhythm: Normal rate and regular rhythm.     Heart sounds: Normal heart sounds.  Pulmonary:     Effort: Pulmonary effort is normal. No respiratory distress.     Breath sounds: No wheezing.  Abdominal:     General: Bowel sounds are normal. There is no distension.     Palpations: Abdomen is soft.  Musculoskeletal:        General: No deformity. Normal range of motion.     Cervical back: Normal range of motion and neck supple.  Skin:    General: Skin is warm and dry.     Findings: No erythema or rash.  Neurological:     Mental Status: He is alert and oriented to person, place, and time. Mental status is at baseline.     Cranial Nerves: No cranial nerve deficit.     Coordination: Coordination normal.  Psychiatric:        Mood and Affect: Mood normal.     LABORATORY DATA:  I have reviewed the data as listed Lab Results  Component Value Date   WBC 6.9 06/03/2022   HGB 14.6 06/03/2022   HCT 43.1 06/03/2022   MCV 91.1 06/03/2022   PLT 212 06/03/2022   Recent Labs     06/26/21 0532 11/29/21 0819  NA 133*  --   K 3.6  --   CL 99  --   CO2  25  --   GLUCOSE 167*  --   BUN 23  --   CREATININE 1.63* 1.90*  CALCIUM 8.3*  --   GFRNONAA 43*  --     Iron/TIBC/Ferritin/ %Sat No results found for: "IRON", "TIBC", "FERRITIN", "IRONPCTSAT"    RADIOGRAPHIC STUDIES: I have personally reviewed the radiological images as listed and agreed with the findings in the report. CT CHEST ABDOMEN PELVIS W CONTRAST  Result Date: 05/29/2022 CLINICAL DATA:  History of renal cell carcinoma status post left nephrectomy surveillance/follow-up. * Tracking Code: BO * EXAM: CT CHEST, ABDOMEN, AND PELVIS WITH CONTRAST TECHNIQUE: Multidetector CT imaging of the chest, abdomen and pelvis was performed following the standard protocol during bolus administration of intravenous contrast. RADIATION DOSE REDUCTION: This exam was performed according to the departmental dose-optimization program which includes automated exposure control, adjustment of the mA and/or kV according to patient size and/or use of iterative reconstruction technique. CONTRAST:  71m OMNIPAQUE IOHEXOL 300 MG/ML  SOLN COMPARISON:  Multiple priors including most recent CT November 29, 2021 FINDINGS: CT CHEST FINDINGS Cardiovascular: Aortic atherosclerosis. 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 No central pulmonary embolus on this nondedicated study. Normal size heart. No significant pericardial effusion/thickening. Mediastinum/Nodes: No suspicious thyroid nodule. No pathologically enlarged mediastinal, hilar or axillary lymph nodes. Large hiatal hernia. Lungs/Pleura: Stable 4 mm right upper lobe perifissural nodule on image 71/4 unchanged dating back to May 21, 2021 and likely reflecting a subpleural lymph node. Stable subpleural pulmonary nodule in the paramedian left upper lobe on image 69/4. No new suspicious pulmonary nodules or masses. Hypoventilatory change in the dependent lungs. No  pleural effusion. No pneumothorax. Musculoskeletal: Lucent focus in the T11 vertebral body on sagittal image 112/6 is stable dating back to March 28, 2021 and favored benign. No aggressive lytic or blastic lesion of bone in the thorax. Multilevel degenerative changes spine. Degenerative changes bilateral shoulders. CT ABDOMEN PELVIS FINDINGS Hepatobiliary: No suspicious hepatic lesion. Gallbladder is unremarkable. No biliary ductal dilation. Pancreas: No pancreatic ductal dilation or evidence of acute inflammation. A portion of the pancreatic body extends into the large hiatal hernia. Spleen: No splenomegaly or focal splenic lesion. Adrenals/Urinary Tract: Bilateral adrenal glands appear normal. Surgical change of prior left nephrectomy with unchanged size of the soft tissue and fluid in the nephrectomy bed measuring 2.3 x 1.2 cm favored to reflect postsurgical change given stability. No new suspicious enhancing soft tissue nodularity. Right kidney is unremarkable without hydronephrosis or suspicious renal mass. Urinary bladder is unremarkable for degree of distension. Stomach/Bowel: No radiopaque enteric contrast material was administered. Large hiatal hernia/intrathoracic stomach. No pathologic dilation of small or large bowel. The appendix appears surgically absent. Colonic diverticulosis without findings of acute diverticulitis. No evidence of acute bowel inflammation. Vascular/Lymphatic: Aortic atherosclerosis without abdominal aortic aneurysm. No pathologically enlarged abdominal or pelvic lymph nodes. Reproductive: Prostate gland is surgically absent without new suspicious enhancing nodularity in the resection bed. Other: No significant abdominopelvic free fluid. Postsurgical change in the abdominal wall. Musculoskeletal: New lytic lesion in the L2 vertebral body with a soft tissue component extending through a cortical break on image 70/2. New lucent lesion in the intertrochanteric left femur measuring 2.2 cm  on image 108/5. Schmorl's node in the L3 inferior endplate is unchanged from prior. IMPRESSION: 1. New lytic lesions in the L2 vertebral body and intertrochanteric left femur are most consistent with osseous metastatic disease. 2. Prior left nephrectomy with unchanged size of the soft tissue and fluid collection  in the nephrectomy bed favored to reflect postsurgical change given stability. Continued attention on follow-up imaging suggested. 3. Small lucent focus in the T11 vertebral body is nonspecific but stable from prior imaging suggestive of a benign etiology, attention on follow-up imaging suggested. 4. Stable small bilateral pulmonary nodules, continued attention on follow-up imaging suggested. 5. Large hiatal hernia/intrathoracic stomach also containing a portion of the pancreatic body. 6. Prior prostatectomy without suspicious enhancing nodularity in the prostatectomy bed. These results will be called to the ordering clinician or representative by the Radiologist Assistant, and communication documented in the PACS or [employer]. Electronically Signed   By: Dahlia Bailiff M.D.   On: 05/29/2022 16:59

## 2022-06-03 NOTE — Assessment & Plan Note (Signed)
History of stage III left RCC, recent CT scan findings were reviewed and discussed with patient Lytic bone lesion of L2 and intertrochanteric left femur, consistent with osseous metastasis. With patient's remote history of prostate cancer, I will check PSA -<0.01 Check multiple myeloma panel, light chain ratio, 24-hour urine protein electrophoresis to rule out other etiologies of lytic lesion. Discussed with patient about possibility of osseous metastasis secondary to left RCC. Recommend bone scan for further evaluation We discussed about systemic immunotherapy with Keytruda +/- bone radiation.  Rationale and potential side effects were reviewed and discussed with patient.  His treatment plan will be finalized after bone scan, tumor board discussion.

## 2022-06-03 NOTE — Assessment & Plan Note (Signed)
PSA is less than 0.01.  Bone lesions are less likely secondary to prostate cancer

## 2022-06-04 ENCOUNTER — Telehealth: Payer: Self-pay

## 2022-06-04 DIAGNOSIS — C61 Malignant neoplasm of prostate: Secondary | ICD-10-CM

## 2022-06-04 LAB — KAPPA/LAMBDA LIGHT CHAINS
Kappa free light chain: 39.3 mg/L — ABNORMAL HIGH (ref 3.3–19.4)
Kappa, lambda light chain ratio: 2.44 — ABNORMAL HIGH (ref 0.26–1.65)
Lambda free light chains: 16.1 mg/L (ref 5.7–26.3)

## 2022-06-04 NOTE — Telephone Encounter (Signed)
-----   Message from [name], MD sent at 06/03/2022  9:04 PM EDT ----- His PSA is low most likely prostate cancer I recommended bone scan for further evaluation of the bone lesions.  We discussed about this during our encounter.  Please schedule  Follow-up to be determined.

## 2022-06-04 NOTE — Telephone Encounter (Signed)
Called to inform patient of Dr. Collie Siad recommendation to further evaluate bone lesion with a Bone scan. Patient didn't answer. Left detailed message. Advised to call us with any questions or concerns.    Lamar Sprinkles, please schedule patient for: Bone scan. Please inform patient of appt.

## 2022-06-05 ENCOUNTER — Other Ambulatory Visit: Payer: Self-pay

## 2022-06-05 DIAGNOSIS — Z8546 Personal history of malignant neoplasm of prostate: Secondary | ICD-10-CM

## 2022-06-05 DIAGNOSIS — C642 Malignant neoplasm of left kidney, except renal pelvis: Secondary | ICD-10-CM | POA: Diagnosis not present

## 2022-06-05 DIAGNOSIS — M899 Disorder of bone, unspecified: Secondary | ICD-10-CM

## 2022-06-06 LAB — MULTIPLE MYELOMA PANEL, SERUM
Albumin SerPl Elph-Mcnc: 4.1 g/dL (ref 2.9–4.4)
Albumin/Glob SerPl: 1.8 — ABNORMAL HIGH (ref 0.7–1.7)
Alpha 1: 0.2 g/dL (ref 0.0–0.4)
Alpha2 Glob SerPl Elph-Mcnc: 0.5 g/dL (ref 0.4–1.0)
B-Globulin SerPl Elph-Mcnc: 0.9 g/dL (ref 0.7–1.3)
Gamma Glob SerPl Elph-Mcnc: 0.8 g/dL (ref 0.4–1.8)
Globulin, Total: 2.4 g/dL (ref 2.2–3.9)
IgA: 75 mg/dL (ref 61–437)
IgG (Immunoglobin G), Serum: 820 mg/dL (ref 603–1613)
IgM (Immunoglobulin M), Srm: 42 mg/dL (ref 15–143)
Total Protein ELP: 6.5 g/dL (ref 6.0–8.5)

## 2022-06-06 LAB — IFE+PROTEIN ELECTRO, 24-HR UR
% BETA, Urine: 0 %
ALPHA 1 URINE: 0 %
Albumin, U: 100 %
Alpha 2, Urine: 0 %
GAMMA GLOBULIN URINE: 0 %
Total Protein, Urine-Ur/day: <120 mg/24 hr (ref 30–150)
Total Protein, Urine: <4 mg/dL
Total Volume: 3000

## 2022-06-11 ENCOUNTER — Encounter
Admission: RE | Admit: 2022-06-11 | Discharge: 2022-06-11 | Disposition: A | Discharge status: Home / Self Care | Payer: Medicare Other | Source: Ambulatory Visit | Attending: Oncology | Admitting: Oncology

## 2022-06-11 DIAGNOSIS — C61 Malignant neoplasm of prostate: Secondary | ICD-10-CM | POA: Insufficient documentation

## 2022-06-11 MED ORDER — TECHNETIUM TC 99M MEDRONATE IV KIT
20.0000 | PACK | Freq: Once | INTRAVENOUS | Status: AC | PRN
Start: 2022-06-11 — End: 2022-06-11
  Administered 2022-06-11: 20.58 via INTRAVENOUS

## 2022-06-13 ENCOUNTER — Other Ambulatory Visit: Payer: Self-pay | Admitting: Oncology

## 2022-06-13 ENCOUNTER — Telehealth: Payer: Self-pay

## 2022-06-13 NOTE — Telephone Encounter (Signed)
Pt informed of follow up plan.   Please schedule patient for MD only after 8/24 and inform pt of appt details.

## 2022-06-13 NOTE — Telephone Encounter (Signed)
-----   Message from [name], MD sent at 06/13/2022 10:37 AM EDT ----- Bone scan is negative. However these lytic lesions are still suspicious. I will discuss his case on tumor board and please arrange MD follow up after 8/24.

## 2022-06-13 NOTE — Telephone Encounter (Signed)
Needs to be seen after 8/24 so it can be Friday or early the following week.

## 2022-06-20 ENCOUNTER — Other Ambulatory Visit: Payer: Medicare Other

## 2022-06-20 NOTE — Progress Notes (Signed)
Tumor Board Documentation  Peter Stuart was presented by Dr Tasia Catchings at our Tumor Board on 06/20/2022, which included representatives from medical oncology, surgical, pharmacy, pulmonology, radiology, genetics, pathology, nutrition, research, navigation, radiation oncology, internal medicine, palliative care.  Peter Stuart currently presents as a current patient, for discussion with history of the following treatments: active survellience, surgical intervention(s).  Additionally, we reviewed previous medical and familial history, history of present illness, and recent lab results along with all available histopathologic and imaging studies. The tumor board considered available treatment options and made the following recommendations: Immunotherapy, Adjuvant radiation IR said that they can biopsy the bone lesion if needed  The following procedures/referrals were also placed: No orders of the defined types were placed in this encounter.   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  Tumor board is a meeting of clinicians from various specialty areas who evaluate and discuss patients for whom a multidisciplinary approach is being considered. Final determinations in the plan of care are those of the provider(s). The responsibility for follow up of recommendations given during tumor board is that of the provider.   Today's extended care, comprehensive team conference, Peter Stuart was not present for the discussion and was not examined.   Multidisciplinary Tumor Board is a multidisciplinary case peer review process.  Decisions discussed in the Multidisciplinary Tumor Board reflect the opinions of the specialists present at the conference without having examined the patient.  Ultimately, treatment and diagnostic decisions  rest with the primary provider(s) and the patient.

## 2022-06-24 ENCOUNTER — Inpatient Hospital Stay (HOSPITAL_BASED_OUTPATIENT_CLINIC_OR_DEPARTMENT_OTHER): Payer: Medicare Other | Admitting: Oncology

## 2022-06-24 ENCOUNTER — Telehealth: Payer: Self-pay

## 2022-06-24 ENCOUNTER — Encounter: Payer: Self-pay | Admitting: Oncology

## 2022-06-24 VITALS — BP 118/85 | HR 63 | Temp 98.7°F | Resp 18 | Wt 245.8 lb

## 2022-06-24 DIAGNOSIS — M899 Disorder of bone, unspecified: Secondary | ICD-10-CM

## 2022-06-24 DIAGNOSIS — C642 Malignant neoplasm of left kidney, except renal pelvis: Secondary | ICD-10-CM

## 2022-06-24 DIAGNOSIS — M898X9 Other specified disorders of bone, unspecified site: Secondary | ICD-10-CM

## 2022-06-24 NOTE — Telephone Encounter (Signed)
Per LOS on 8/28:  CT guided biopsy of L2 bone lesion. [Put note, discussed on tumor board on 8/24]  MD 1 week after biopsy

## 2022-06-24 NOTE — Assessment & Plan Note (Addendum)
History of stage III left RCC, recent CT scan findings were reviewed and discussed with patient Lytic bone lesion of L2 and intertrochanteric left femur, consistent with osseous metastasis.  PSA -<0.01 Negative multiple myeloma panel, slight increase light chain ratio which is nonspecific, 24-hour urine protein electrophoresis negative Bone scan showed no focal uptake. His case was discussed with tumor board.  Despite negative bone scan, radiographic appearance of L2, and femur lesion remain suspicious for metastatic disease.  PET scan may not offer additional information.  Consensus reached a point obtaining bone lesion biopsy of L2.  Patient agrees with the plan.

## 2022-06-24 NOTE — Progress Notes (Signed)
Hematology/Oncology Progress note [phone] [phone]      Patient Care Team: Dion Body, MD as PCP - General (Family Medicine)  ASSESSMENT & PLAN:   Cancer of kidney, left [facility]) History of stage III left RCC, recent CT scan findings were reviewed and discussed with patient Lytic bone lesion of L2 and intertrochanteric left femur, consistent with osseous metastasis.  PSA -<0.01 Negative multiple myeloma panel, slight increase light chain ratio which is nonspecific, 24-hour urine protein electrophoresis negative Bone scan showed no focal uptake. His case was discussed with tumor board.  Despite negative bone scan, radiographic appearance of L2, and femur lesion remain suspicious for metastatic disease.  PET scan may not offer additional information.  Consensus reached a point obtaining bone lesion biopsy of L2.  Patient agrees with the plan.  Orders Placed This Encounter  Procedures   CT BIOPSY    Standing Status:   Future    Standing Expiration Date:   06/24/2023    Order Specific Question:   Reason for exam:    Answer:   CT guided biopsy of L2 bone lesion. Discussed on tumor board on 8/24    Order Specific Question:   Preferred imaging location?    Answer:   [facility]   Follow up 1 week after biopsy. We spent sufficient time to discuss many aspect of care, questions were answered to patient's satisfaction. A total of 25 minutes was spent on this visit.  With 10 minutes spent reviewing image findings,  10 minutes counseling the patient on tumor board recommendation, rationale and the potential benefits of bone biopsy.  Additional 5 minutes was spent on answering patient's questions.  All questions were answered. The patient knows to call the clinic with any problems, questions or concerns.  Peter Server, MD, PhD [facility] Hematology Oncology 06/24/2022   CHIEF COMPLAINTS/REASON FOR VISIT:  RCC  HISTORY OF PRESENTING ILLNESS:   Peter Stuart is a  76 y.o.  male presents for follow-up of history of left RCC and history of prostate cancer. Oncology history summary listed as below. Oncology History  Cancer of kidney, left ([place])  2009 Initial Diagnosis   History of prostate cancer treated with robotic prostatectomy initial   04/27/2021 Imaging   CT abdomen pelvis showed acute appendicitis without evidence of perforation or abscess.  Large left renal mass consistent with renal malignancy with renal vein invasion.  Focal annular thickening of descending colon.  05/01/2021 CT chest with contrast showed large sliding-type hiatal hernia.  Mild bilateral posterior bibasilar subsegmental atelectasis   06/25/2021 Initial Diagnosis   Cancer of kidney, left  06/25/2021, patient underwent radical nephrectomy by Dr. Diamantina Providence. Pathology showed clear-cell renal cell carcinoma, nuclear grade 2.  Benign adrenal tissue.  Lymphovascular invasion present. pT3a pNx    06/25/2021 Cancer Staging   Staging form: Kidney, AJCC 8th Edition - Clinical stage from 06/25/2021: Stage III (cT3a, cNX, cM0) - Signed by Peter Server, MD on 08/06/2021 Stage prefix: Initial diagnosis   05/29/2022 Imaging   CT chest abdomen pelvis with contrast showed -1. New lytic lesions in the L2 vertebral body and intertrochanteric left femur are most consistent with osseous metastatic disease. 2. Prior left nephrectomy with unchanged size of the soft tissue and fluid collection in the nephrectomy bed favored to reflect postsurgical change given stability. Continued attention on follow-up imaging suggested. 3. Small lucent focus in the T11 vertebral body is nonspecific but stable from prior imaging suggestive of a benign etiology, attention on follow-up  imaging suggested. 4. Stable small bilateral pulmonary nodules, continued attention on follow-up imaging suggested. 5. Large hiatal hernia/intrathoracic stomach also containing a portion of the pancreatic body. 6. Prior prostatectomy  without suspicious enhancing nodularity in the prostatectomy bed.    Patient presents to discuss results.  Accompanied by daughter Peter Stuart. He has experienced some back pain, mild.     Review of Systems  Constitutional:  Negative for appetite change, chills, fever and unexpected weight change.  HENT:   Negative for hearing loss and voice change.   Eyes:  Negative for eye problems and icterus.  Respiratory:  Negative for chest tightness, cough and shortness of breath.   Cardiovascular:  Negative for chest pain and leg swelling.  Gastrointestinal:  Negative for abdominal distention and abdominal pain.  Endocrine: Negative for hot flashes.  Genitourinary:  Negative for difficulty urinating, dysuria and frequency.   Musculoskeletal:  Positive for back pain. Negative for arthralgias.  Skin:  Negative for itching and rash.  Neurological:  Negative for light-headedness and numbness.  Hematological:  Negative for adenopathy. Does not bruise/bleed easily.  Psychiatric/Behavioral:  Negative for confusion.     MEDICAL HISTORY:  Past Medical History:  Diagnosis Date   Anemia    Cancer ([place])    Complication of anesthesia    slow to wake after 1 surgery   CPAP use counseling    HTN (hypertension)    OSA on CPAP    Prostate cancer (H[facility])    Seasonal allergies    Skin cancer     SURGICAL HISTORY: Past Surgical History:  Procedure Laterality Date   HERNIA REPAIR     LAPAROSCOPIC APPENDECTOMY N/A 04/27/2021   Procedure: APPENDECTOMY LAPAROSCOPIC;  Surgeon: Fredirick Maudlin, MD;  Location: [facility] ORS;  Service: General;  Laterality: N/A;   LAPAROSCOPIC NEPHRECTOMY, HAND ASSISTED Left 06/25/2021   Procedure: HAND ASSISTED LAPAROSCOPIC NEPHRECTOMY;  Surgeon: Billey Co, MD;  Location: [facility] ORS;  Service: Urology;  Laterality: Left;   prostatectomy     REPAIR KNEE LIGAMENT     SHOULDER ARTHROSCOPY WITH SUBACROMIAL DECOMPRESSION AND OPEN ROTATOR C Right 12/29/2020   Procedure: Right shoulder  arthroscopic rotator cuff repair, subacromial decompression, and biceps tenodesis;  Surgeon: Leim Fabry, MD;  Location: [place];  Service: Orthopedics;  Laterality: Right;   TONSILLECTOMY     VASECTOMY      SOCIAL HISTORY: Social History   Socioeconomic History   Marital status: Widowed    Spouse name: Not on file   Number of children: Not on file   Years of education: Not on file   Highest education level: Not on file  Occupational History   Not on file  Tobacco Use   Smoking status: Never    Passive exposure: Never   Smokeless tobacco: Never  Vaping Use   Vaping Use: Never used  Substance and Sexual Activity   Alcohol use: Not Currently   Drug use: Never   Sexual activity: Not Currently  Other Topics Concern   Not on file  Social History Narrative   Not on file   Social Determinants of Health   Financial Resource Strain: Not on file  Food Insecurity: Not on file  Transportation Needs: Not on file  Physical Activity: Not on file  Stress: Not on file  Social Connections: Not on file  Intimate Partner Violence: Not on file    FAMILY HISTORY: Family History  Problem Relation Age of Onset   Stroke Mother    Hypertension Father  Emphysema Father     ALLERGIES:  has No Known Allergies.  MEDICATIONS:  Current Outpatient Medications  Medication Sig Dispense Refill   ALLEGRA ALLERGY 60 MG tablet Take 60 mg by mouth daily.     cholecalciferol (VITAMIN D3) 25 MCG (1000 UNIT) tablet Take 1,000 Units by mouth daily.     clotrimazole (LOTRIMIN) 1 % cream Apply topically 2 (two) times daily.     Coenzyme Q10 10 MG capsule Take 10 mg by mouth daily.     ezetimibe (ZETIA) 10 MG tablet Take 1 tablet by mouth daily.     fluticasone (FLONASE) 50 MCG/ACT nasal spray Place into the nose.     gabapentin (NEURONTIN) 400 MG capsule Take 400 mg by mouth at bedtime.     GINKGO BILOBA COMPLEX PO Take by mouth.     hydrochlorothiazide (HYDRODIURIL) 25 MG tablet Take  by mouth.     losartan (COZAAR) 100 MG tablet Take 100 mg by mouth daily.     Multiple Vitamin (MULTIVITAMIN) capsule Take 1 capsule by mouth daily.     terbinafine (LAMISIL) 1 % cream Apply 1 application topically daily as needed (irritation).     No current facility-administered medications for this visit.     PHYSICAL EXAMINATION: ECOG PERFORMANCE STATUS: 0 - Asymptomatic Vitals:   06/24/22 1413  BP: 118/85  Pulse: 63  Resp: 18  Temp: 98.7 F (37.1 C)   Filed Weights   06/24/22 1413  Weight: 245 lb 12.8 oz (111.5 kg)    Physical Exam Constitutional:      General: He is not in acute distress.    Appearance: He is obese.  HENT:     Head: Normocephalic and atraumatic.  Eyes:     General: No scleral icterus. Cardiovascular:     Rate and Rhythm: Normal rate and regular rhythm.     Heart sounds: Normal heart sounds.  Pulmonary:     Effort: Pulmonary effort is normal. No respiratory distress.     Breath sounds: No wheezing.  Abdominal:     General: Bowel sounds are normal. There is no distension.     Palpations: Abdomen is soft.  Musculoskeletal:        General: No deformity. Normal range of motion.     Cervical back: Normal range of motion and neck supple.  Skin:    General: Skin is warm and dry.     Findings: No erythema or rash.  Neurological:     Mental Status: He is alert and oriented to person, place, and time. Mental status is at baseline.     Cranial Nerves: No cranial nerve deficit.     Coordination: Coordination normal.  Psychiatric:        Mood and Affect: Mood normal.     LABORATORY DATA:  I have reviewed the data as listed Lab Results  Component Value Date   WBC 6.9 06/03/2022   HGB 14.6 06/03/2022   HCT 43.1 06/03/2022   MCV 91.1 06/03/2022   PLT 212 06/03/2022   Recent Labs    06/26/21 0532 11/29/21 0819  NA 133*  --   K 3.6  --   CL 99  --   CO2 25  --   GLUCOSE 167*  --   BUN 23  --   CREATININE 1.63* 1.90*  CALCIUM 8.3*  --    GFRNONAA 43*  --     Iron/TIBC/Ferritin/ %Sat No results found for: "IRON", "TIBC", "FERRITIN", "IRONPCTSAT"    RADIOGRAPHIC STUDIES:  I have personally reviewed the radiological images as listed and agreed with the findings in the report. NM Bone Scan Whole Body  Result Date: 06/12/2022 CLINICAL DATA:  Lesions on CT EXAM: NUCLEAR MEDICINE WHOLE BODY BONE SCAN TECHNIQUE: Whole body anterior and posterior images were obtained approximately 3 hours after intravenous injection of radiopharmaceutical. RADIOPHARMACEUTICALS:  20.58 mCi Technetium-63mMDP IV COMPARISON:  CT 05/29/2022 FINDINGS: Physiologic uptake present in the right kidney. The patient is status post left nephrectomy. Probable degenerative activity at the shoulders and sternoclavicular regions. No definite focal activity is seen to correspond to the CT lytic lesions at the left femur and L2 vertebral body. IMPRESSION: 1. No focal activity or convincing photopenia to correspond to the lytic lesions seen on recent CT, however imaging appearance on the CT remains suspicious for possible metastatic disease. 2. Otherwise probable degenerative uptake within the shoulders and sternoclavicular regions. Electronically Signed   By: KDonavan FoilM.D.   On: 06/12/2022 18:30   CT CHEST ABDOMEN PELVIS W CONTRAST  Result Date: 05/29/2022 CLINICAL DATA:  History of renal cell carcinoma status post left nephrectomy surveillance/follow-up. * Tracking Code: BO * EXAM: CT CHEST, ABDOMEN, AND PELVIS WITH CONTRAST TECHNIQUE: Multidetector CT imaging of the chest, abdomen and pelvis was performed following the standard protocol during bolus administration of intravenous contrast. RADIATION DOSE REDUCTION: This exam was performed according to the departmental dose-optimization program which includes automated exposure control, adjustment of the mA and/or kV according to patient size and/or use of iterative reconstruction technique. CONTRAST:  720mOMNIPAQUE  IOHEXOL 300 MG/ML  SOLN COMPARISON:  Multiple priors including most recent CT November 29, 2021 FINDINGS: CT CHEST FINDINGS Cardiovascular: Aortic atherosclerosis. 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 No central pulmonary embolus on this nondedicated study. Normal size heart. No significant pericardial effusion/thickening. Mediastinum/Nodes: No suspicious thyroid nodule. No pathologically enlarged mediastinal, hilar or axillary lymph nodes. Large hiatal hernia. Lungs/Pleura: Stable 4 mm right upper lobe perifissural nodule on image 71/4 unchanged dating back to May 21, 2021 and likely reflecting a subpleural lymph node. Stable subpleural pulmonary nodule in the paramedian left upper lobe on image 69/4. No new suspicious pulmonary nodules or masses. Hypoventilatory change in the dependent lungs. No pleural effusion. No pneumothorax. Musculoskeletal: Lucent focus in the T11 vertebral body on sagittal image 112/6 is stable dating back to March 28, 2021 and favored benign. No aggressive lytic or blastic lesion of bone in the thorax. Multilevel degenerative changes spine. Degenerative changes bilateral shoulders. CT ABDOMEN PELVIS FINDINGS Hepatobiliary: No suspicious hepatic lesion. Gallbladder is unremarkable. No biliary ductal dilation. Pancreas: No pancreatic ductal dilation or evidence of acute inflammation. A portion of the pancreatic body extends into the large hiatal hernia. Spleen: No splenomegaly or focal splenic lesion. Adrenals/Urinary Tract: Bilateral adrenal glands appear normal. Surgical change of prior left nephrectomy with unchanged size of the soft tissue and fluid in the nephrectomy bed measuring 2.3 x 1.2 cm favored to reflect postsurgical change given stability. No new suspicious enhancing soft tissue nodularity. Right kidney is unremarkable without hydronephrosis or suspicious renal mass. Urinary bladder is unremarkable for degree of distension.  Stomach/Bowel: No radiopaque enteric contrast material was administered. Large hiatal hernia/intrathoracic stomach. No pathologic dilation of small or large bowel. The appendix appears surgically absent. Colonic diverticulosis without findings of acute diverticulitis. No evidence of acute bowel inflammation. Vascular/Lymphatic: Aortic atherosclerosis without abdominal aortic aneurysm. No pathologically enlarged abdominal or pelvic lymph nodes. Reproductive: Prostate gland is surgically absent without  new suspicious enhancing nodularity in the resection bed. Other: No significant abdominopelvic free fluid. Postsurgical change in the abdominal wall. Musculoskeletal: New lytic lesion in the L2 vertebral body with a soft tissue component extending through a cortical break on image 70/2. New lucent lesion in the intertrochanteric left femur measuring 2.2 cm on image 108/5. Schmorl's node in the L3 inferior endplate is unchanged from prior. IMPRESSION: 1. New lytic lesions in the L2 vertebral body and intertrochanteric left femur are most consistent with osseous metastatic disease. 2. Prior left nephrectomy with unchanged size of the soft tissue and fluid collection in the nephrectomy bed favored to reflect postsurgical change given stability. Continued attention on follow-up imaging suggested. 3. Small lucent focus in the T11 vertebral body is nonspecific but stable from prior imaging suggestive of a benign etiology, attention on follow-up imaging suggested. 4. Stable small bilateral pulmonary nodules, continued attention on follow-up imaging suggested. 5. Large hiatal hernia/intrathoracic stomach also containing a portion of the pancreatic body. 6. Prior prostatectomy without suspicious enhancing nodularity in the prostatectomy bed. These results will be called to the ordering clinician or representative by the Radiologist Assistant, and communication documented in the PACS or [employer]. Electronically Signed    By: Dahlia Bailiff M.D.   On: 05/29/2022 16:59

## 2022-06-25 ENCOUNTER — Ambulatory Visit (INDEPENDENT_AMBULATORY_CARE_PROVIDER_SITE_OTHER): Payer: Medicare Other | Admitting: Urology

## 2022-06-25 ENCOUNTER — Encounter: Payer: Self-pay | Admitting: Urology

## 2022-06-25 VITALS — BP 125/82 | HR 62 | Ht 69.0 in | Wt 238.0 lb

## 2022-06-25 DIAGNOSIS — C649 Malignant neoplasm of unspecified kidney, except renal pelvis: Secondary | ICD-10-CM

## 2022-06-25 DIAGNOSIS — M898X8 Other specified disorders of bone, other site: Secondary | ICD-10-CM

## 2022-06-25 DIAGNOSIS — Z85528 Personal history of other malignant neoplasm of kidney: Secondary | ICD-10-CM

## 2022-06-25 DIAGNOSIS — Z8546 Personal history of malignant neoplasm of prostate: Secondary | ICD-10-CM

## 2022-06-25 DIAGNOSIS — N189 Chronic kidney disease, unspecified: Secondary | ICD-10-CM | POA: Diagnosis not present

## 2022-06-25 DIAGNOSIS — C61 Malignant neoplasm of prostate: Secondary | ICD-10-CM

## 2022-06-25 NOTE — Progress Notes (Signed)
   06/25/2022 8:42 AM   Peter Stuart 1946/05/10 [id number]  Reason for visit: Follow up left RCC, history of prostate cancer, CKD  HPI: 76 year old male with history of prostate cancer treated with robotic prostatectomy in [place] in 2009 and undetectable PSA since that time who was found to have an 8 cm central left renal mass and underwent an uncomplicated left hand-assisted laparoscopic radical nephrectomy on 06/25/2021, with pathology showing renal cell carcinoma, Fuhrman grade 2, stage pT3a with negative margins.  I personally viewed and interpreted the imaging from 05/29/2022 of a CT chest abdomen and pelvis with contrast which showed new lytic lesions in the L2 vertebral body and intertrochanteric left femur concerning for osseous metastatic disease.  PSA was checked and was undetectable.  I had called him with those results and referred him back to establish care with oncology for suspected metastatic kidney cancer.  I reviewed the outside notes from Dr. Tasia Catchings and oncology.  She ordered a bone scan which did not show definitive evidence of metastatic disease, but CT remained suspicious for metastatic RCC.  He was discussed in tumor board, and planning to undergo bone biopsy to confirm diagnosis of recurrent metastatic RCC, with tentative plan for targeted radiation and immunotherapy with pembrolizumab.  Agree with this plan moving forward, number of questions were answered today, and I think he has a good understanding of the plan moving forward.  I will follow-up bone biopsy results, and see him back in 9 months for ongoing follow-up.    Billey Co, [facility] [address] [place], [place] [address] [phone]

## 2022-06-27 NOTE — Telephone Encounter (Signed)
Peter Stuart is scheduled for CT Bm Biopsy Tues  9/5 @ 10a  Arrive '@9a'$ . Pt informed of appt.   Please schedule patient for MD only on 9/15 @ 10:30a. Pt aware of this appt, but would like appt reminders sent. Thanks

## 2022-06-28 ENCOUNTER — Other Ambulatory Visit (HOSPITAL_COMMUNITY): Payer: Self-pay | Admitting: Student

## 2022-06-28 DIAGNOSIS — C61 Malignant neoplasm of prostate: Secondary | ICD-10-CM

## 2022-06-28 NOTE — Progress Notes (Signed)
Patient for bone lesion biopsy 9/5, called and spoke with daughter Mandy/patient on phone with pre procedure instructions given. Made aware to arrive at 0900, NPO after MN prior to day of procedure as well as driver post procedure/recovery/discharge. Stated understanding.

## 2022-06-28 NOTE — H&P (Signed)
Chief Complaint: Patient was seen in consultation today for L2 bone lesion   at the request of Yu,Zhou  Referring Physician(s): Yu,Zhou  Supervising Physician: Pernell Dupre  Patient Status: [facility] - Out-pt  History of Present Illness: Peter Stuart is a 76 y.o. 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 Pt was referred to IR for bone lesion biopsy by Dr. Cathie Hoops. Dr. Elby Showers, IR, approved L2 lytic bone lesion biopsy.   Past Medical History:  Diagnosis Date   Anemia    Cancer (HCC)    Complication of anesthesia    slow to wake after 1 surgery   CPAP use counseling    HTN (hypertension)    OSA on CPAP    Prostate cancer (HCC)    Seasonal allergies    Skin cancer     Past Surgical History:  Procedure Laterality Date   HERNIA REPAIR     LAPAROSCOPIC APPENDECTOMY N/A 04/27/2021   Procedure: APPENDECTOMY LAPAROSCOPIC;  Surgeon: Duanne Guess, MD;  Location: [facility] ORS;  Service: General;  Laterality: N/A;   LAPAROSCOPIC NEPHRECTOMY, HAND ASSISTED Left 06/25/2021   Procedure: HAND ASSISTED LAPAROSCOPIC NEPHRECTOMY;  Surgeon: Sondra Come, MD;  Location: [facility] ORS;  Service: Urology;  Laterality: Left;   prostatectomy     REPAIR KNEE LIGAMENT     SHOULDER ARTHROSCOPY WITH SUBACROMIAL DECOMPRESSION AND OPEN ROTATOR C Right 12/29/2020   Procedure: Right shoulder arthroscopic rotator cuff repair, subacromial decompression, and biceps tenodesis;  Surgeon: Signa Kell, MD;  Location: [facility];  Service: Orthopedics;  Laterality: Right;   TONSILLECTOMY     VASECTOMY      Allergies: Patient has no known allergies.  Medications: Prior to Admission medications   Medication Sig Start Date End Date Taking? Authorizing Provider  ALLEGRA ALLERGY 60 MG tablet Take 60 mg by mouth daily. 01/13/20   [provider]  cholecalciferol  (VITAMIN D3) 25 MCG (1000 UNIT) tablet Take 1,000 Units by mouth daily.    [provider]  clotrimazole (LOTRIMIN) 1 % cream Apply topically 2 (two) times daily.    [provider]  Coenzyme Q10 10 MG capsule Take 10 mg by mouth daily.    [provider]  ezetimibe (ZETIA) 10 MG tablet Take 1 tablet by mouth daily. 11/01/21   [provider]  fluticasone (FLONASE) 50 MCG/ACT nasal spray Place into the nose. 01/22/21   [provider]  gabapentin (NEURONTIN) 400 MG capsule Take 400 mg by mouth at bedtime. 06/01/20   [provider]  Neila Gear COMPLEX PO Take by mouth.    [provider]  hydrochlorothiazide (HYDRODIURIL) 25 MG tablet Take 1 tablet by mouth daily. 05/14/22   [provider]  losartan (COZAAR) 100 MG tablet Take 100 mg by mouth daily. 05/30/20   [provider]  Multiple Vitamin (MULTIVITAMIN) capsule Take 1 capsule by mouth daily.    [provider]  terbinafine (LAMISIL) 1 % cream Apply 1 application topically daily as needed (irritation).    [provider]     Family History  Problem Relation Age of Onset   Stroke Mother    Hypertension Father    Emphysema Father     Social History   Socioeconomic History   Marital status: Widowed    Spouse name: Not on file   Number of children: Not on file  Years of education: Not on file   Highest education level: Not on file  Occupational History   Not on file  Tobacco Use   Smoking status: Never    Passive exposure: Never   Smokeless tobacco: Never  Vaping Use   Vaping Use: Never used  Substance and Sexual Activity   Alcohol use: Not Currently   Drug use: Never   Sexual activity: Not Currently  Other Topics Concern   Not on file  Social History Narrative   Not on file   Social Determinants of Health   Financial Resource Strain: Not on file  Food Insecurity: Not on file  Transportation Needs: Not on file  Physical  Activity: Not on file  Stress: Not on file  Social Connections: Not on file     Review of Systems: A 12 point ROS discussed and pertinent positives are indicated in the HPI above.  All other systems are negative.  Review of Systems  Constitutional:  Negative for chills, fatigue and fever.  Respiratory:  Positive for cough. Negative for shortness of breath.   Cardiovascular:  Positive for leg swelling. Negative for chest pain.  Gastrointestinal:  Negative for abdominal pain, nausea and vomiting.  Musculoskeletal:  Positive for back pain.  Neurological:  Negative for dizziness, weakness, light-headedness and headaches.    Vital Signs: There were no vitals taken for this visit.   Physical Exam Vitals reviewed.  Constitutional:      General: He is not in acute distress.    Appearance: Normal appearance. He is not ill-appearing.  HENT:     Head: Normocephalic and atraumatic.     Mouth/Throat:     Pharynx: Oropharynx is clear.  Eyes:     Extraocular Movements: Extraocular movements intact.     Pupils: Pupils are equal, round, and reactive to light.  Cardiovascular:     Rate and Rhythm: Normal rate and regular rhythm.     Pulses: Normal pulses.     Heart sounds: Normal heart sounds. No murmur heard. Pulmonary:     Effort: Pulmonary effort is normal. No respiratory distress.     Breath sounds: Normal breath sounds.  Abdominal:     General: Bowel sounds are normal. There is distension.     Palpations: Abdomen is soft.     Tenderness: There is no abdominal tenderness. There is no guarding.  Musculoskeletal:     Right lower leg: No edema.     Left lower leg: No edema.  Skin:    General: Skin is warm and dry.  Neurological:     Mental Status: He is alert and oriented to person, place, and time.  Psychiatric:        Mood and Affect: Mood normal.        Behavior: Behavior normal.        Thought Content: Thought content normal.        Judgment: Judgment normal.      Imaging: NM Bone Scan Whole Body  Result Date: 06/12/2022 CLINICAL DATA:  Lesions on CT EXAM: NUCLEAR MEDICINE WHOLE BODY BONE SCAN TECHNIQUE: Whole body anterior and posterior images were obtained approximately 3 hours after intravenous injection of radiopharmaceutical. RADIOPHARMACEUTICALS:  20.58 mCi Technetium-71m MDP IV COMPARISON:  CT 05/29/2022 FINDINGS: Physiologic uptake present in the right kidney. The patient is status post left nephrectomy. Probable degenerative activity at the shoulders and sternoclavicular regions. No definite focal activity is seen to correspond to the CT lytic lesions at the left femur and L2 vertebral  body. IMPRESSION: 1. No focal activity or convincing photopenia to correspond to the lytic lesions seen on recent CT, however imaging appearance on the CT remains suspicious for possible metastatic disease. 2. Otherwise probable degenerative uptake within the shoulders and sternoclavicular regions. Electronically Signed   By: Jasmine Pang M.D.   On: 06/12/2022 18:30    Labs:  CBC: Recent Labs    06/03/22 1125 07/02/22 0913  WBC 6.9 7.3  HGB 14.6 14.9  HCT 43.1 43.8  PLT 212 189    COAGS: Recent Labs    07/02/22 0913  INR 1.0    BMP: Recent Labs    11/29/21 0819  CREATININE 1.90*    LIVER FUNCTION TESTS: No results for input(s): "BILITOT", "AST", "ALT", "ALKPHOS", "PROT", "ALBUMIN" in the last 8760 hours.  TUMOR MARKERS: No results for input(s): "AFPTM", "CEA", "CA199", "CHROMGRNA" in the last 8760 hours.  Assessment and Plan: 76 yo male with history of prostate cancer and RCC presents to IR today for L2 lytic bone lesion biopsy.   Pt resting on stretcher with daughter at bedside.  He is A&O, calm and pleasant.  He is NPO per order.  Risks and benefits of L2 bone lesion biopsy with moderate sedation was discussed with the patient and/or patient's family including, but not limited to bleeding, infection, damage to adjacent structures  or low yield requiring additional tests.  All of the questions were answered and there is agreement to proceed.  Consent signed and in chart.   Thank you for this interesting consult.  I greatly enjoyed meeting Gerron Guidotti and look forward to participating in their care.  A copy of this report was sent to the requesting provider on this date.  Electronically Signed: Shon Hough, NP 07/02/2022, 9:54 AM   I spent a total of 20 minutes in face to face in clinical consultation, greater than 50% of which was counseling/coordinating care for L2 lytic bone lesion.

## 2022-07-02 ENCOUNTER — Ambulatory Visit
Admission: RE | Admit: 2022-07-02 | Discharge: 2022-07-02 | Disposition: A | Discharge status: Home / Self Care | Payer: Medicare Other | Source: Ambulatory Visit | Attending: Oncology | Admitting: Oncology

## 2022-07-02 ENCOUNTER — Other Ambulatory Visit: Payer: Self-pay

## 2022-07-02 ENCOUNTER — Ambulatory Visit
Admission: RE | Admit: 2022-07-02 | Discharge: 2022-07-02 | Disposition: A | Discharge status: Home / Self Care | Payer: Medicare Other | Source: Ambulatory Visit | Attending: Interventional Radiology | Admitting: Interventional Radiology

## 2022-07-02 ENCOUNTER — Encounter: Payer: Self-pay | Admitting: Radiology

## 2022-07-02 DIAGNOSIS — C61 Malignant neoplasm of prostate: Secondary | ICD-10-CM | POA: Diagnosis not present

## 2022-07-02 DIAGNOSIS — C7951 Secondary malignant neoplasm of bone: Secondary | ICD-10-CM | POA: Diagnosis present

## 2022-07-02 DIAGNOSIS — C642 Malignant neoplasm of left kidney, except renal pelvis: Secondary | ICD-10-CM | POA: Diagnosis not present

## 2022-07-02 DIAGNOSIS — Z905 Acquired absence of kidney: Secondary | ICD-10-CM | POA: Diagnosis not present

## 2022-07-02 DIAGNOSIS — Z8546 Personal history of malignant neoplasm of prostate: Secondary | ICD-10-CM | POA: Insufficient documentation

## 2022-07-02 DIAGNOSIS — M899 Disorder of bone, unspecified: Secondary | ICD-10-CM

## 2022-07-02 HISTORY — PX: IR FLUORO GUIDED NEEDLE PLC ASPIRATION/INJECTION LOC: IMG2395

## 2022-07-02 LAB — PROTIME-INR
INR: 1 (ref 0.8–1.2)
Prothrombin Time: 13.3 seconds (ref 11.4–15.2)

## 2022-07-02 LAB — CBC
HCT: 43.8 % (ref 39.0–52.0)
Hemoglobin: 14.9 g/dL (ref 13.0–17.0)
MCH: 29.7 pg (ref 26.0–34.0)
MCHC: 34 g/dL (ref 30.0–36.0)
MCV: 87.4 fL (ref 80.0–100.0)
Platelets: 189 10*3/uL (ref 150–400)
RBC: 5.01 MIL/uL (ref 4.22–5.81)
RDW: 12.8 % (ref 11.5–15.5)
WBC: 7.3 10*3/uL (ref 4.0–10.5)
nRBC: 0 % (ref 0.0–0.2)

## 2022-07-02 MED ORDER — FENTANYL CITRATE (PF) 100 MCG/2ML IJ SOLN
INTRAMUSCULAR | Status: AC
  Filled 2022-07-02: qty 2

## 2022-07-02 MED ORDER — MIDAZOLAM HCL 2 MG/2ML IJ SOLN
INTRAMUSCULAR | Status: AC
  Filled 2022-07-02: qty 2

## 2022-07-02 MED ORDER — SODIUM CHLORIDE 0.9 % IV SOLN: INTRAVENOUS | Status: DC

## 2022-07-02 MED ORDER — MIDAZOLAM HCL 2 MG/2ML IJ SOLN
INTRAMUSCULAR | Status: AC | PRN
  Administered 2022-07-02 (×3): .5 mg via INTRAVENOUS

## 2022-07-02 MED ORDER — FENTANYL CITRATE (PF) 100 MCG/2ML IJ SOLN
INTRAMUSCULAR | Status: AC | PRN
  Administered 2022-07-02 (×3): 25 ug via INTRAVENOUS

## 2022-07-02 MED ORDER — LIDOCAINE HCL (PF) 1 % IJ SOLN
INTRAMUSCULAR | Status: AC
  Filled 2022-07-02: qty 30

## 2022-07-02 NOTE — Procedures (Signed)
Interventional Radiology Procedure Note  Date of Procedure: 07/02/2022  Procedure: L2 core biopsy   Findings:  1. L2 core biopsy using fluoro    Complications: No immediate complications noted.   Estimated Blood Loss: minimal  Follow-up and Recommendations: 1. Bedrest 2 hours    Albin Felling, MD  Vascular & Interventional Radiology  07/02/2022 11:15 AM

## 2022-07-03 ENCOUNTER — Other Ambulatory Visit: Payer: Self-pay | Admitting: Anatomic Pathology & Clinical Pathology

## 2022-07-03 LAB — SURGICAL PATHOLOGY

## 2022-07-11 ENCOUNTER — Other Ambulatory Visit: Payer: Self-pay | Admitting: Oncology

## 2022-07-11 NOTE — Progress Notes (Signed)
START ON PATHWAY REGIMEN - Renal Cell     A cycle is every 21 days:     Axitinib      Pembrolizumab   **Always confirm dose/schedule in your pharmacy ordering system**  Patient Characteristics: Stage IV (Unresected T4M0 or Any T, M1)/Metastatic Disease, Clear Cell, First Line, Intermediate or Poor Risk Therapeutic Status: Stage IV (Unresected T4M0 or Any T, M1)/Metastatic Disease Histology: Clear Cell Line of Therapy: First Line Risk Status: Intermediate Risk Intent of Therapy: Non-Curative / Palliative Intent, Discussed with Patient

## 2022-07-12 ENCOUNTER — Encounter: Payer: Self-pay | Admitting: Oncology

## 2022-07-12 ENCOUNTER — Inpatient Hospital Stay: Payer: Medicare Other | Attending: Oncology | Admitting: Oncology

## 2022-07-12 ENCOUNTER — Other Ambulatory Visit: Payer: Self-pay

## 2022-07-12 VITALS — BP 116/75 | HR 64 | Temp 99.6°F | Resp 18 | Wt 244.9 lb

## 2022-07-12 DIAGNOSIS — C642 Malignant neoplasm of left kidney, except renal pelvis: Secondary | ICD-10-CM | POA: Diagnosis present

## 2022-07-12 DIAGNOSIS — Z7189 Other specified counseling: Secondary | ICD-10-CM | POA: Diagnosis not present

## 2022-07-12 DIAGNOSIS — Z79899 Other long term (current) drug therapy: Secondary | ICD-10-CM | POA: Diagnosis not present

## 2022-07-12 DIAGNOSIS — C7951 Secondary malignant neoplasm of bone: Secondary | ICD-10-CM | POA: Diagnosis present

## 2022-07-12 DIAGNOSIS — C61 Malignant neoplasm of prostate: Secondary | ICD-10-CM | POA: Diagnosis not present

## 2022-07-12 DIAGNOSIS — Z5112 Encounter for antineoplastic immunotherapy: Secondary | ICD-10-CM | POA: Diagnosis present

## 2022-07-12 NOTE — Assessment & Plan Note (Signed)
PSA is less than 0.01.   

## 2022-07-12 NOTE — Assessment & Plan Note (Signed)
Discussed with patient

## 2022-07-12 NOTE — Progress Notes (Signed)
Hematology/Oncology Progress note [phone] [phone]      Patient Care Team: Dion Body, MD as PCP - General (Family Medicine)  ASSESSMENT & PLAN:   Cancer Staging  Cancer of kidney, left [facility]) Staging form: Kidney, AJCC 8th Edition - Clinical stage from 06/25/2021: Stage III (cT3a, cNX, cM0) - Signed by [name], MD on 08/06/2021   Cancer of kidney, left [facility]) History of stage III left RCC, Lytic bone lesion of L2 and intertrochanteric left femur, consistent with osseous metastasis. Biopsy of L2 + metastatic carcinoma, stage IV, limited disease burden.  Results were reviewed with patient and daughter.  Recommend immunotherapy Keytruda with Axitinib. Rational and side effects were reviewed.  Patient is concerned about combined side effects and opted to proceed with Keytruda only.   I discussed the mechanism of action and rationale of using immunotherapy.  The goal of therapy is palliative; and length of treatments are likely ongoing/based upon the results of the scans. Discussed the potential side effects of immunotherapy including but not limited to diarrhea; skin rash; respiratory failure, kidney failure, mental status change, elevated LFTs/liver failure,endocrine abnormalities, acute deterioration  and even death,etc. patient voices understanding and agrees with proceeding with treatment.   Goals of care, counseling/discussion Discussed with patient.   Prostate cancer ([place]) PSA is less than 0.01.    No orders of the defined types were placed in this encounter.  Follow up 1-2 weeks lab MD [facility] All questions were answered. The patient knows to call the clinic with any problems, questions or concerns.  [name], MD, PhD [facility] Oncology 07/12/2022   CHIEF COMPLAINTS/REASON FOR VISIT:  RCC  HISTORY OF PRESENTING ILLNESS:   Peter Stuart is a  76 y.o.  male presents for follow-up of history of left RCC and history of  prostate cancer. Oncology history summary listed as below. Oncology History  Cancer of kidney, left ([place])  2009 Initial Diagnosis   History of prostate cancer treated with robotic prostatectomy initial   04/27/2021 Imaging   CT abdomen pelvis showed acute appendicitis without evidence of perforation or abscess.  Large left renal mass consistent with renal malignancy with renal vein invasion.  Focal annular thickening of descending colon.  05/01/2021 CT chest with contrast showed large sliding-type hiatal hernia.  Mild bilateral posterior bibasilar subsegmental atelectasis   06/25/2021 Initial Diagnosis   Cancer of kidney, left  06/25/2021, patient underwent radical nephrectomy by Dr. Diamantina Providence. Pathology showed clear-cell renal cell carcinoma, nuclear grade 2.  Benign adrenal tissue.  Lymphovascular invasion present. pT3a pNx    06/25/2021 Cancer Staging   Staging form: Kidney, AJCC 8th Edition - Clinical stage from 06/25/2021: Stage III (cT3a, cNX, cM0) - Signed by [name], MD on 08/06/2021 Stage prefix: Initial diagnosis   05/29/2022 Imaging   CT chest abdomen pelvis with contrast showed -1. New lytic lesions in the L2 vertebral body and intertrochanteric left femur are most consistent with osseous metastatic disease. 2. Prior left nephrectomy with unchanged size of the soft tissue and fluid collection in the nephrectomy bed favored to reflect postsurgical change given stability. Continued attention on follow-up imaging suggested. 3. Small lucent focus in the T11 vertebral body is nonspecific but stable from prior imaging suggestive of a benign etiology, attention on follow-up imaging suggested. 4. Stable small bilateral pulmonary nodules, continued attention on follow-up imaging suggested. 5. Large hiatal hernia/intrathoracic stomach also containing a portion of the pancreatic body. 6. Prior prostatectomy without suspicious enhancing nodularity in the prostatectomy bed.  07/15/2022 Procedure    L2 vertebral body CT guided biopsy showed positive for malignancy, metastatic carcinoma, morphologically compatible with known clear cell renal cell carcinoma.    07/19/2022 -  Chemotherapy   RENAL CELL Pembrolizumab (200)       Patient presents to discuss results.  Accompanied by daughter [name]. He has experienced some back pain, mild. During the interval he has had L2 biopsy.     Review of Systems  Constitutional:  Negative for appetite change, chills, fever and unexpected weight change.  HENT:   Negative for hearing loss and voice change.   Eyes:  Negative for eye problems and icterus.  Respiratory:  Negative for chest tightness, cough and shortness of breath.   Cardiovascular:  Negative for chest pain and leg swelling.  Gastrointestinal:  Negative for abdominal distention and abdominal pain.  Endocrine: Negative for hot flashes.  Genitourinary:  Negative for difficulty urinating, dysuria and frequency.   Musculoskeletal:  Positive for back pain. Negative for arthralgias.  Skin:  Negative for itching and rash.  Neurological:  Negative for light-headedness and numbness.  Hematological:  Negative for adenopathy. Does not bruise/bleed easily.  Psychiatric/Behavioral:  Negative for confusion.     MEDICAL HISTORY:  Past Medical History:  Diagnosis Date   Anemia    Cancer ([place])    Complication of anesthesia    slow to wake after 1 surgery   CPAP use counseling    HTN (hypertension)    OSA on CPAP    Prostate cancer (HCC)    Seasonal allergies    Skin cancer     SURGICAL HISTORY: Past Surgical History:  Procedure Laterality Date   HERNIA REPAIR     IR FLUORO GUIDED NEEDLE PLC ASPIRATION/INJECTION LOC  07/02/2022   LAPAROSCOPIC APPENDECTOMY N/A 04/27/2021   Procedure: APPENDECTOMY LAPAROSCOPIC;  Surgeon: Fredirick Maudlin, MD;  Location: [facility] ORS;  Service: General;  Laterality: N/A;   LAPAROSCOPIC NEPHRECTOMY, HAND ASSISTED Left 06/25/2021   Procedure: HAND ASSISTED  LAPAROSCOPIC NEPHRECTOMY;  Surgeon: Billey Co, MD;  Location: [facility] ORS;  Service: Urology;  Laterality: Left;   prostatectomy     REPAIR KNEE LIGAMENT     SHOULDER ARTHROSCOPY WITH SUBACROMIAL DECOMPRESSION AND OPEN ROTATOR C Right 12/29/2020   Procedure: Right shoulder arthroscopic rotator cuff repair, subacromial decompression, and biceps tenodesis;  Surgeon: Leim Fabry, MD;  Location: [place];  Service: Orthopedics;  Laterality: Right;   TONSILLECTOMY     VASECTOMY      SOCIAL HISTORY: Social History   Socioeconomic History   Marital status: Widowed    Spouse name: Not on file   Number of children: Not on file   Years of education: Not on file   Highest education level: Not on file  Occupational History   Not on file  Tobacco Use   Smoking status: Never    Passive exposure: Never   Smokeless tobacco: Never  Vaping Use   Vaping Use: Never used  Substance and Sexual Activity   Alcohol use: Not Currently   Drug use: Never   Sexual activity: Not Currently  Other Topics Concern   Not on file  Social History Narrative   Not on file   Social Determinants of Health   Financial Resource Strain: Not on file  Food Insecurity: Not on file  Transportation Needs: Not on file  Physical Activity: Not on file  Stress: Not on file  Social Connections: Not on file  Intimate Partner Violence: Not on file  FAMILY HISTORY: Family History  Problem Relation Age of Onset   Stroke Mother    Hypertension Father    Emphysema Father     ALLERGIES:  has No Known Allergies.  MEDICATIONS:  Current Outpatient Medications  Medication Sig Dispense Refill   ALLEGRA ALLERGY 60 MG tablet Take 60 mg by mouth daily.     cholecalciferol (VITAMIN D3) 25 MCG (1000 UNIT) tablet Take 1,000 Units by mouth daily.     clotrimazole (LOTRIMIN) 1 % cream Apply topically 2 (two) times daily.     Coenzyme Q10 10 MG capsule Take 10 mg by mouth daily.     ezetimibe (ZETIA) 10 MG  tablet Take 1 tablet by mouth daily.     fluticasone (FLONASE) 50 MCG/ACT nasal spray Place into the nose.     gabapentin (NEURONTIN) 400 MG capsule Take 400 mg by mouth at bedtime.     GINKGO BILOBA COMPLEX PO Take by mouth.     hydrochlorothiazide (HYDRODIURIL) 25 MG tablet Take 1 tablet by mouth daily.     losartan (COZAAR) 100 MG tablet Take 100 mg by mouth daily.     Multiple Vitamin (MULTIVITAMIN) capsule Take 1 capsule by mouth daily.     multivitamin-lutein (OCUVITE-LUTEIN) CAPS capsule Take 1 capsule by mouth daily.     Omega-3 Fatty Acids (FISH OIL) 1000 MG CAPS Take 1 capsule by mouth 1 day or 1 dose.     terbinafine (LAMISIL) 1 % cream Apply 1 application topically daily as needed (irritation).     No current facility-administered medications for this visit.     PHYSICAL EXAMINATION: ECOG PERFORMANCE STATUS: 0 - Asymptomatic Vitals:   07/12/22 1008  BP: 116/75  Pulse: 64  Resp: 18  Temp: 99.6 F (37.6 C)   Filed Weights   07/12/22 1008  Weight: 244 lb 14.4 oz (111.1 kg)    Physical Exam Constitutional:      General: He is not in acute distress.    Appearance: He is obese.  HENT:     Head: Normocephalic and atraumatic.  Eyes:     General: No scleral icterus. Cardiovascular:     Rate and Rhythm: Normal rate and regular rhythm.     Heart sounds: Normal heart sounds.  Pulmonary:     Effort: Pulmonary effort is normal. No respiratory distress.     Breath sounds: No wheezing.  Abdominal:     General: Bowel sounds are normal. There is no distension.     Palpations: Abdomen is soft.  Musculoskeletal:        General: No deformity. Normal range of motion.     Cervical back: Normal range of motion and neck supple.  Skin:    General: Skin is warm and dry.     Findings: No erythema or rash.  Neurological:     Mental Status: He is alert and oriented to person, place, and time. Mental status is at baseline.     Cranial Nerves: No cranial nerve deficit.      Coordination: Coordination normal.  Psychiatric:        Mood and Affect: Mood normal.     LABORATORY DATA:  I have reviewed the data as listed    Latest Ref Rng & Units 07/02/2022    9:13 AM 06/03/2022   11:25 AM 06/26/2021    5:32 AM  CBC  WBC 4.0 - 10.5 K/uL 7.3  6.9  13.7   Hemoglobin 13.0 - 17.0 g/dL 14.9  14.6  13.6  Hematocrit 39.0 - 52.0 % 43.8  43.1  40.0   Platelets 150 - 400 K/uL 189  212  176       Latest Ref Rng & Units 11/29/2021    8:19 AM 06/26/2021    5:32 AM 04/27/2021   10:55 AM  CMP  Glucose 70 - 99 mg/dL  167  169   BUN 8 - 23 mg/dL  23  14   Creatinine 0.61 - 1.24 mg/dL 1.90  1.63  1.01   Sodium 135 - 145 mmol/L  133  135   Potassium 3.5 - 5.1 mmol/L  3.6  3.5   Chloride 98 - 111 mmol/L  99  99   CO2 22 - 32 mmol/L  25  29   Calcium 8.9 - 10.3 mg/dL  8.3  9.1   Total Protein 6.5 - 8.1 g/dL   6.6   Total Bilirubin 0.3 - 1.2 mg/dL   0.9   Alkaline Phos 38 - 126 U/L   49   AST 15 - 41 U/L   17   ALT 0 - 44 U/L   16     RADIOGRAPHIC STUDIES: I have personally reviewed the radiological images as listed and agreed with the findings in the report. IR Fluoro Guide Ndl Plmt / BX  Result Date: 07/02/2022 INDICATION: New lytic lesion at L2, history of RCC EXAM: Core needle biopsy of L2 vertebral body using fluoroscopic guidance MEDICATIONS: None. ANESTHESIA/SEDATION: Moderate (conscious) sedation was employed during this procedure. A total of Versed 3.5 mg and Fentanyl 75 mcg was administered intravenously by the radiology nurse. Total intra-service moderate Sedation Time: 39 minutes. The patient's level of consciousness and vital signs were monitored continuously by radiology nursing throughout the procedure under my direct supervision. FLUOROSCOPY: 4.1 minutes (90 mGy) COMPLICATIONS: None immediate. PROCEDURE: Informed written consent was obtained from the patient after a thorough discussion of the procedural risks, benefits and alternatives. All questions were  addressed. Maximal Sterile Barrier Technique was utilized including caps, mask, sterile gowns, sterile gloves, sterile drape, hand hygiene and skin antiseptic. A timeout was performed prior to the initiation of the procedure. The patient was positioned prone on the exam table. A unipedicular access was planned. Skin entry site(s) were marked overlying the L2 vertebral body using fluoroscopy. The overlying skin was then prepped and draped in the standard sterile fashion. Local analgesia was obtained with 1% lidocaine. Attention was turned to the right side. Under fluoroscopic guidance, an 11 gauge introducer needle was advanced towards the margin of the pedicle. Using multiple projections, the introducer needle was advanced towards the posterior margin of the vertebral body via a transpedicular approach, aiming for the lateral aspect of the vertebral body. The inner needle was then removed, and a core needle biopsy of the vertebral body was performed using a 14 gauge core biopsy device. A small amount of tissue was retrieved and sent to the lab for analysis. Access needle was repositioned targeting the mid to inferior portion of the lateral aspect of the vertebral body, for a total of x3 separate core biopsy passes. At the end of the procedure, the introducer cannula was removed without difficulty. Clean dressings were placed after hemostasis. The patient tolerated all aspects of the procedure well, and was transferred to recovery in stable condition. IMPRESSION: Successful core needle biopsy of the lytic lesion along the right lateral aspect of the L2 vertebral body using fluoroscopic guidance. Electronically Signed   By: Albin Felling M.D.   On:  07/02/2022 16:03     

## 2022-07-12 NOTE — Assessment & Plan Note (Addendum)
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 Biopsy of L2 + metastatic carcinoma, stage IV, limited disease burden.  Results were reviewed with patient and daughter.  Recommend immunotherapy Keytruda with Axitinib. Rational and side effects were reviewed.  Patient is concerned about combined side effects and opted to proceed with Keytruda only.   I discussed the mechanism of action and rationale of using immunotherapy.  The goal of therapy is palliative; and length of treatments are likely ongoing/based upon the results of the scans. Discussed the potential side effects of immunotherapy including but not limited to diarrhea; skin rash; respiratory failure, kidney failure, mental status change, elevated LFTs/liver failure,endocrine abnormalities, acute deterioration  and even death,etc. patient voices understanding and agrees with proceeding with treatment.

## 2022-07-13 ENCOUNTER — Other Ambulatory Visit: Payer: Self-pay

## 2022-07-15 ENCOUNTER — Inpatient Hospital Stay: Payer: Medicare Other

## 2022-07-22 ENCOUNTER — Encounter: Payer: Self-pay | Admitting: Oncology

## 2022-07-22 ENCOUNTER — Inpatient Hospital Stay (HOSPITAL_BASED_OUTPATIENT_CLINIC_OR_DEPARTMENT_OTHER): Payer: Medicare Other | Admitting: Oncology

## 2022-07-22 ENCOUNTER — Inpatient Hospital Stay: Payer: Medicare Other

## 2022-07-22 VITALS — BP 114/75 | HR 61 | Temp 96.7°F | Resp 16 | Wt 247.0 lb

## 2022-07-22 DIAGNOSIS — Z5112 Encounter for antineoplastic immunotherapy: Secondary | ICD-10-CM | POA: Diagnosis not present

## 2022-07-22 DIAGNOSIS — C642 Malignant neoplasm of left kidney, except renal pelvis: Secondary | ICD-10-CM | POA: Diagnosis not present

## 2022-07-22 DIAGNOSIS — N1832 Chronic kidney disease, stage 3b: Secondary | ICD-10-CM

## 2022-07-22 LAB — COMPREHENSIVE METABOLIC PANEL WITH GFR
ALT: 17 U/L (ref 0–44)
AST: 17 U/L (ref 15–41)
Albumin: 3.9 g/dL (ref 3.5–5.0)
Alkaline Phosphatase: 52 U/L (ref 38–126)
Anion gap: 5 (ref 5–15)
BUN: 35 mg/dL — ABNORMAL HIGH (ref 8–23)
CO2: 27 mmol/L (ref 22–32)
Calcium: 8.8 mg/dL — ABNORMAL LOW (ref 8.9–10.3)
Chloride: 105 mmol/L (ref 98–111)
Creatinine, Ser: 1.82 mg/dL — ABNORMAL HIGH (ref 0.61–1.24)
GFR, Estimated: 38 mL/min — ABNORMAL LOW
Glucose, Bld: 117 mg/dL — ABNORMAL HIGH (ref 70–99)
Potassium: 3.6 mmol/L (ref 3.5–5.1)
Sodium: 137 mmol/L (ref 135–145)
Total Bilirubin: 0.6 mg/dL (ref 0.3–1.2)
Total Protein: 6.6 g/dL (ref 6.5–8.1)

## 2022-07-22 LAB — CBC WITH DIFFERENTIAL/PLATELET
Abs Immature Granulocytes: 0.09 K/uL — ABNORMAL HIGH (ref 0.00–0.07)
Basophils Absolute: 0.1 K/uL (ref 0.0–0.1)
Basophils Relative: 1 %
Eosinophils Absolute: 0.5 K/uL (ref 0.0–0.5)
Eosinophils Relative: 8 %
HCT: 41.8 % (ref 39.0–52.0)
Hemoglobin: 14.5 g/dL (ref 13.0–17.0)
Immature Granulocytes: 2 %
Lymphocytes Relative: 20 %
Lymphs Abs: 1.2 K/uL (ref 0.7–4.0)
MCH: 30.6 pg (ref 26.0–34.0)
MCHC: 34.7 g/dL (ref 30.0–36.0)
MCV: 88.2 fL (ref 80.0–100.0)
Monocytes Absolute: 0.6 K/uL (ref 0.1–1.0)
Monocytes Relative: 11 %
Neutro Abs: 3.6 K/uL (ref 1.7–7.7)
Neutrophils Relative %: 58 %
Platelets: 201 K/uL (ref 150–400)
RBC: 4.74 MIL/uL (ref 4.22–5.81)
RDW: 12.6 % (ref 11.5–15.5)
WBC: 6 K/uL (ref 4.0–10.5)
nRBC: 0 % (ref 0.0–0.2)

## 2022-07-22 LAB — T4, FREE: Free T4: 0.84 ng/dL (ref 0.61–1.12)

## 2022-07-22 LAB — TSH: TSH: 1.197 u[IU]/mL (ref 0.350–4.500)

## 2022-07-22 MED ORDER — SODIUM CHLORIDE 0.9 % IV SOLN
200.0000 mg | Freq: Once | INTRAVENOUS | Status: AC
  Administered 2022-07-22: 200 mg via INTRAVENOUS
  Filled 2022-07-22: qty 8

## 2022-07-22 MED ORDER — SODIUM CHLORIDE 0.9 % IV SOLN
Freq: Once | INTRAVENOUS | Status: AC
  Filled 2022-07-22: qty 250

## 2022-07-22 NOTE — Patient Instructions (Signed)
[facility] [place]  Discharge Instructions: Thank you for choosing [facility] to provide your oncology and hematology care.  If you have a lab appointment with the Cancer Center, please go directly to the Cancer Center and check in at the registration area.  Wear comfortable clothing and clothing appropriate for easy access to any Portacath or PICC line.   We strive to give you quality time with your provider. You may need to reschedule your appointment if you arrive late (15 or more minutes).  Arriving late affects you and other patients whose appointments are after yours.  Also, if you miss three or more appointments without notifying the office, you may be dismissed from the clinic at the provider's discretion.      For prescription refill requests, have your pharmacy contact our office and allow 72 hours for refills to be completed.    Today you received the following chemotherapy and/or immunotherapy agents Keytruda      To help prevent nausea and vomiting after your treatment, we encourage you to take your nausea medication as directed.  BELOW ARE SYMPTOMS THAT SHOULD BE REPORTED IMMEDIATELY: *FEVER GREATER THAN 100.4 F (38 C) OR HIGHER *CHILLS OR SWEATING *NAUSEA AND VOMITING THAT IS NOT CONTROLLED WITH YOUR NAUSEA MEDICATION *UNUSUAL SHORTNESS OF BREATH *UNUSUAL BRUISING OR BLEEDING *URINARY PROBLEMS (pain or burning when urinating, or frequent urination) *BOWEL PROBLEMS (unusual diarrhea, constipation, pain near the anus) TENDERNESS IN MOUTH AND THROAT WITH OR WITHOUT PRESENCE OF ULCERS (sore throat, sores in mouth, or a toothache) UNUSUAL RASH, SWELLING OR PAIN  UNUSUAL VAGINAL DISCHARGE OR ITCHING   Items with * indicate a potential emergency and should be followed up as soon as possible or go to the Emergency Department if any problems should occur.  Please show the CHEMOTHERAPY ALERT CARD or IMMUNOTHERAPY ALERT CARD at check-in to  the Emergency Department and triage nurse.  Should you have questions after your visit or need to cancel or reschedule your appointment, please contact [facility] [place]  [phone] and follow the prompts.  Office hours are 8:00 a.m. to 4:30 p.m. Monday - Friday. Please note that voicemails left after 4:00 p.m. may not be returned until the following business day.  We are closed weekends and major holidays. You have access to a nurse at all times for urgent questions. Please call the main number to the clinic [phone] and follow the prompts.  For any non-urgent questions, you may also contact your provider using MyChart. We now offer e-Visits for anyone 18 and older to request care online for non-urgent symptoms. For details visit [email or web address].   Also download the MyChart app! Go to the app store, search "MyChart", open the app, select , and log in with your MyChart username and password.  Masks are optional in the cancer centers. If you would like for your care team to wear a mask while they are taking care of you, please let them know. For doctor visits, patients may have with them one support person who is at least 78 years old. At this time, visitors are not allowed in the infusion area.   

## 2022-07-22 NOTE — Assessment & Plan Note (Signed)
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 Biopsy of L2 + metastatic carcinoma, stage IV, limited disease burden.  Results were reviewed with patient and daughter.  Labs are reviewed and discussed with patient. Proceed with Keytruda.

## 2022-07-22 NOTE — Progress Notes (Signed)
Pt and daughter in for new [place] start today.

## 2022-07-22 NOTE — Assessment & Plan Note (Signed)
Treatment plan listed above.  Monitor immunotherapy side effects 

## 2022-07-22 NOTE — Progress Notes (Signed)
Hematology/Oncology Progress note [phone] [phone]      Patient Care Team: Dion Body, MD as PCP - General (Family Medicine)  ASSESSMENT & PLAN:   Cancer Staging  Cancer of kidney, left [facility]) Staging form: Kidney, AJCC 8th Edition - Clinical stage from 06/25/2021: Stage III (cT3a, cNX, cM0) - Signed by [name], MD on 08/06/2021   Cancer of kidney, left [facility]) History of stage III left RCC, Lytic bone lesion of L2 and intertrochanteric left femur, consistent with osseous metastasis. Biopsy of L2 + metastatic carcinoma, stage IV, limited disease burden.  Results were reviewed with patient and daughter.  Labs are reviewed and discussed with patient. Proceed with Keytruda.    CKD (chronic kidney disease) stage 3, GFR 30-59 ml/min (HCC) Encourage oral hydration, avoid nephrotoxins  Encounter for antineoplastic immunotherapy Treatment plan listed above.  Monitor immunotherapy side effects   Orders Placed This Encounter  Procedures   T4, free    Standing Status:   Future    Standing Expiration Date:   09/03/2023   CBC with Differential    Standing Status:   Future    Standing Expiration Date:   09/03/2023   Comprehensive metabolic panel    Standing Status:   Future    Standing Expiration Date:   09/03/2023   TSH    Standing Status:   Future    Standing Expiration Date:   09/03/2023   CBC with Differential    Standing Status:   Future    Standing Expiration Date:   09/24/2023   Comprehensive metabolic panel    Standing Status:   Future    Standing Expiration Date:   09/24/2023    Follow up 3 weeks lab MD Keytruda All questions were answered. The patient knows to call the clinic with any problems, questions or concerns.  [name], MD, PhD [facility] Hematology Oncology 07/22/2022   CHIEF COMPLAINTS/REASON FOR VISIT:  RCC  HISTORY OF PRESENTING ILLNESS:   Peter Stuart is a  76 y.o.  male presents for follow-up of history of left  RCC and history of prostate cancer. Oncology history summary listed as below. Oncology History  Cancer of kidney, left ([place])  2009 Initial Diagnosis   History of prostate cancer treated with robotic prostatectomy initial   04/27/2021 Imaging   CT abdomen pelvis showed acute appendicitis without evidence of perforation or abscess.  Large left renal mass consistent with renal malignancy with renal vein invasion.  Focal annular thickening of descending colon.  05/01/2021 CT chest with contrast showed large sliding-type hiatal hernia.  Mild bilateral posterior bibasilar subsegmental atelectasis   06/25/2021 Initial Diagnosis   Cancer of kidney, left  06/25/2021, patient underwent radical nephrectomy by Dr. Diamantina Providence. Pathology showed clear-cell renal cell carcinoma, nuclear grade 2.  Benign adrenal tissue.  Lymphovascular invasion present. pT3a pNx    06/25/2021 Cancer Staging   Staging form: Kidney, AJCC 8th Edition - Clinical stage from 06/25/2021: Stage III (cT3a, cNX, cM0) - Signed by [name], MD on 08/06/2021 Stage prefix: Initial diagnosis   05/29/2022 Imaging   CT chest abdomen pelvis with contrast showed -1. New lytic lesions in the L2 vertebral body and intertrochanteric left femur are most consistent with osseous metastatic disease. 2. Prior left nephrectomy with unchanged size of the soft tissue and fluid collection in the nephrectomy bed favored to reflect postsurgical change given stability. Continued attention on follow-up imaging suggested. 3. Small lucent focus in the T11 vertebral body is nonspecific but stable from  prior imaging suggestive of a benign etiology, attention on follow-up imaging suggested. 4. Stable small bilateral pulmonary nodules, continued attention on follow-up imaging suggested. 5. Large hiatal hernia/intrathoracic stomach also containing a portion of the pancreatic body. 6. Prior prostatectomy without suspicious enhancing nodularity in the prostatectomy bed.    07/15/2022 Procedure   L2 vertebral body CT guided biopsy showed positive for malignancy, metastatic carcinoma, morphologically compatible with known clear cell renal cell carcinoma.    07/19/2022 -  Chemotherapy   RENAL CELL Pembrolizumab (200)       INTERVAL HISTORY Peter Stuart is a 76 y.o. male who has above history reviewed by me today presents for follow up visit for metastatic RCC He has not new complaints today.   Review of Systems  Constitutional:  Negative for appetite change, chills, fever and unexpected weight change.  HENT:   Negative for hearing loss and voice change.   Eyes:  Negative for eye problems and icterus.  Respiratory:  Negative for chest tightness, cough and shortness of breath.   Cardiovascular:  Negative for chest pain and leg swelling.  Gastrointestinal:  Negative for abdominal distention and abdominal pain.  Endocrine: Negative for hot flashes.  Genitourinary:  Negative for difficulty urinating, dysuria and frequency.   Musculoskeletal:  Positive for back pain. Negative for arthralgias.  Skin:  Negative for itching and rash.  Neurological:  Negative for light-headedness and numbness.  Hematological:  Negative for adenopathy. Does not bruise/bleed easily.  Psychiatric/Behavioral:  Negative for confusion.     MEDICAL HISTORY:  Past Medical History:  Diagnosis Date   Anemia    Cancer ([place])    Complication of anesthesia    slow to wake after 1 surgery   CPAP use counseling    HTN (hypertension)    OSA on CPAP    Prostate cancer (HCC)    Seasonal allergies    Skin cancer     SURGICAL HISTORY: Past Surgical History:  Procedure Laterality Date   HERNIA REPAIR     IR FLUORO GUIDED NEEDLE PLC ASPIRATION/INJECTION LOC  07/02/2022   LAPAROSCOPIC APPENDECTOMY N/A 04/27/2021   Procedure: APPENDECTOMY LAPAROSCOPIC;  Surgeon: Fredirick Maudlin, MD;  Location: [facility] ORS;  Service: General;  Laterality: N/A;   LAPAROSCOPIC NEPHRECTOMY, HAND ASSISTED Left  06/25/2021   Procedure: HAND ASSISTED LAPAROSCOPIC NEPHRECTOMY;  Surgeon: Billey Co, MD;  Location: [facility] ORS;  Service: Urology;  Laterality: Left;   prostatectomy     REPAIR KNEE LIGAMENT     SHOULDER ARTHROSCOPY WITH SUBACROMIAL DECOMPRESSION AND OPEN ROTATOR C Right 12/29/2020   Procedure: Right shoulder arthroscopic rotator cuff repair, subacromial decompression, and biceps tenodesis;  Surgeon: Leim Fabry, MD;  Location: [place];  Service: Orthopedics;  Laterality: Right;   TONSILLECTOMY     VASECTOMY      SOCIAL HISTORY: Social History   Socioeconomic History   Marital status: Widowed    Spouse name: Not on file   Number of children: Not on file   Years of education: Not on file   Highest education level: Not on file  Occupational History   Not on file  Tobacco Use   Smoking status: Never    Passive exposure: Never   Smokeless tobacco: Never  Vaping Use   Vaping Use: Never used  Substance and Sexual Activity   Alcohol use: Not Currently   Drug use: Never   Sexual activity: Not Currently  Other Topics Concern   Not on file  Social History Narrative   Not  on file   Social Determinants of Health   Financial Resource Strain: Not on file  Food Insecurity: Not on file  Transportation Needs: Not on file  Physical Activity: Not on file  Stress: Not on file  Social Connections: Not on file  Intimate Partner Violence: Not on file    FAMILY HISTORY: Family History  Problem Relation Age of Onset   Stroke Mother    Hypertension Father    Emphysema Father     ALLERGIES:  has No Known Allergies.  MEDICATIONS:  Current Outpatient Medications  Medication Sig Dispense Refill   ALLEGRA ALLERGY 60 MG tablet Take 60 mg by mouth daily.     cholecalciferol (VITAMIN D3) 25 MCG (1000 UNIT) tablet Take 1,000 Units by mouth daily.     clotrimazole (LOTRIMIN) 1 % cream Apply topically 2 (two) times daily.     Coenzyme Q10 10 MG capsule Take 10 mg by mouth  daily.     ezetimibe (ZETIA) 10 MG tablet Take 1 tablet by mouth daily.     fluticasone (FLONASE) 50 MCG/ACT nasal spray Place into the nose.     gabapentin (NEURONTIN) 400 MG capsule Take 400 mg by mouth at bedtime.     GINKGO BILOBA COMPLEX PO Take by mouth.     hydrochlorothiazide (HYDRODIURIL) 25 MG tablet Take 1 tablet by mouth daily.     losartan (COZAAR) 100 MG tablet Take 100 mg by mouth daily.     Multiple Vitamin (MULTIVITAMIN) capsule Take 1 capsule by mouth daily.     multivitamin-lutein (OCUVITE-LUTEIN) CAPS capsule Take 1 capsule by mouth daily.     Omega-3 Fatty Acids (FISH OIL) 1000 MG CAPS Take 1 capsule by mouth 1 day or 1 dose.     terbinafine (LAMISIL) 1 % cream Apply 1 application topically daily as needed (irritation).     No current facility-administered medications for this visit.   Facility-Administered Medications Ordered in Other Visits  Medication Dose Route Frequency Provider Last Rate Last Admin   pembrolizumab (KEYTRUDA) 200 mg in sodium chloride 0.9 % 50 mL chemo infusion  200 mg Intravenous Once [name], MD 116 mL/hr at 07/22/22 1101 200 mg at 07/22/22 1101     PHYSICAL EXAMINATION: ECOG PERFORMANCE STATUS: 0 - Asymptomatic Vitals:   07/22/22 0928  BP: 114/75  Pulse: 61  Resp: 16  Temp: (!) 96.7 F (35.9 C)   Filed Weights   07/22/22 0928  Weight: 247 lb (112 kg)    Physical Exam Constitutional:      General: He is not in acute distress.    Appearance: He is obese.  HENT:     Head: Normocephalic and atraumatic.  Eyes:     General: No scleral icterus. Cardiovascular:     Rate and Rhythm: Normal rate and regular rhythm.     Heart sounds: Normal heart sounds.  Pulmonary:     Effort: Pulmonary effort is normal. No respiratory distress.     Breath sounds: No wheezing.  Abdominal:     General: Bowel sounds are normal. There is no distension.     Palpations: Abdomen is soft.  Musculoskeletal:        General: No deformity. Normal range of  motion.     Cervical back: Normal range of motion and neck supple.  Skin:    General: Skin is warm and dry.     Findings: No erythema or rash.  Neurological:     Mental Status: He is alert and oriented to  person, place, and time. Mental status is at baseline.     Cranial Nerves: No cranial nerve deficit.     Coordination: Coordination normal.  Psychiatric:        Mood and Affect: Mood normal.     LABORATORY DATA:  I have reviewed the data as listed    Latest Ref Rng & Units 07/22/2022    9:04 AM 07/02/2022    9:13 AM 06/03/2022   11:25 AM  CBC  WBC 4.0 - 10.5 K/uL 6.0  7.3  6.9   Hemoglobin 13.0 - 17.0 g/dL 14.5  14.9  14.6   Hematocrit 39.0 - 52.0 % 41.8  43.8  43.1   Platelets 150 - 400 K/uL 201  189  212       Latest Ref Rng & Units 07/22/2022    9:04 AM 11/29/2021    8:19 AM 06/26/2021    5:32 AM  CMP  Glucose 70 - 99 mg/dL 117   167   BUN 8 - 23 mg/dL 35   23   Creatinine 0.61 - 1.24 mg/dL 1.82  1.90  1.63   Sodium 135 - 145 mmol/L 137   133   Potassium 3.5 - 5.1 mmol/L 3.6   3.6   Chloride 98 - 111 mmol/L 105   99   CO2 22 - 32 mmol/L 27   25   Calcium 8.9 - 10.3 mg/dL 8.8   8.3   Total Protein 6.5 - 8.1 g/dL 6.6     Total Bilirubin 0.3 - 1.2 mg/dL 0.6     Alkaline Phos 38 - 126 U/L 52     AST 15 - 41 U/L 17     ALT 0 - 44 U/L 17       RADIOGRAPHIC STUDIES: I have personally reviewed the radiological images as listed and agreed with the findings in the report. IR Fluoro Guide Ndl Plmt / BX  Result Date: 07/02/2022 INDICATION: New lytic lesion at L2, history of RCC EXAM: Core needle biopsy of L2 vertebral body using fluoroscopic guidance MEDICATIONS: None. ANESTHESIA/SEDATION: Moderate (conscious) sedation was employed during this procedure. A total of Versed 3.5 mg and Fentanyl 75 mcg was administered intravenously by the radiology nurse. Total intra-service moderate Sedation Time: 39 minutes. The patient's level of consciousness and vital signs were monitored  continuously by radiology nursing throughout the procedure under my direct supervision. FLUOROSCOPY: 4.1 minutes (90 mGy) COMPLICATIONS: None immediate. PROCEDURE: Informed written consent was obtained from the patient after a thorough discussion of the procedural risks, benefits and alternatives. All questions were addressed. Maximal Sterile Barrier Technique was utilized including caps, mask, sterile gowns, sterile gloves, sterile drape, hand hygiene and skin antiseptic. A timeout was performed prior to the initiation of the procedure. The patient was positioned prone on the exam table. A unipedicular access was planned. Skin entry site(s) were marked overlying the L2 vertebral body using fluoroscopy. The overlying skin was then prepped and draped in the standard sterile fashion. Local analgesia was obtained with 1% lidocaine. Attention was turned to the right side. Under fluoroscopic guidance, an 11 gauge introducer needle was advanced towards the margin of the pedicle. Using multiple projections, the introducer needle was advanced towards the posterior margin of the vertebral body via a transpedicular approach, aiming for the lateral aspect of the vertebral body. The inner needle was then removed, and a core needle biopsy of the vertebral body was performed using a 14 gauge core biopsy device. A small amount of  tissue was retrieved and sent to the lab for analysis. Access needle was repositioned targeting the mid to inferior portion of the lateral aspect of the vertebral body, for a total of x3 separate core biopsy passes. At the end of the procedure, the introducer cannula was removed without difficulty. Clean dressings were placed after hemostasis. The patient tolerated all aspects of the procedure well, and was transferred to recovery in stable condition. IMPRESSION: Successful core needle biopsy of the lytic lesion along the right lateral aspect of the L2 vertebral body using fluoroscopic guidance.  Electronically Signed   By: Albin Felling M.D.   On: 07/02/2022 16:03

## 2022-07-22 NOTE — Assessment & Plan Note (Signed)
Encourage oral hydration, avoid nephrotoxins 

## 2022-07-23 ENCOUNTER — Telehealth: Payer: Self-pay

## 2022-07-23 NOTE — Telephone Encounter (Signed)
Telephone call to patient for follow up after receiving first infusion.   Patient states infusion went great.  States eating good and drinking plenty of fluids.   Denies any nausea or vomiting.  Encouraged patient to call for any concerns or questions. 

## 2022-08-12 ENCOUNTER — Inpatient Hospital Stay: Payer: Medicare Other | Attending: Oncology

## 2022-08-12 ENCOUNTER — Inpatient Hospital Stay (HOSPITAL_BASED_OUTPATIENT_CLINIC_OR_DEPARTMENT_OTHER): Payer: Medicare Other | Admitting: Oncology

## 2022-08-12 ENCOUNTER — Encounter: Payer: Self-pay | Admitting: Oncology

## 2022-08-12 ENCOUNTER — Inpatient Hospital Stay: Payer: Medicare Other

## 2022-08-12 DIAGNOSIS — C7951 Secondary malignant neoplasm of bone: Secondary | ICD-10-CM | POA: Insufficient documentation

## 2022-08-12 DIAGNOSIS — C642 Malignant neoplasm of left kidney, except renal pelvis: Secondary | ICD-10-CM

## 2022-08-12 DIAGNOSIS — Z79899 Other long term (current) drug therapy: Secondary | ICD-10-CM | POA: Insufficient documentation

## 2022-08-12 DIAGNOSIS — R21 Rash and other nonspecific skin eruption: Secondary | ICD-10-CM | POA: Diagnosis not present

## 2022-08-12 DIAGNOSIS — N1832 Chronic kidney disease, stage 3b: Secondary | ICD-10-CM

## 2022-08-12 DIAGNOSIS — Z5112 Encounter for antineoplastic immunotherapy: Secondary | ICD-10-CM | POA: Insufficient documentation

## 2022-08-12 LAB — COMPREHENSIVE METABOLIC PANEL
ALT: 31 U/L (ref 0–44)
AST: 24 U/L (ref 15–41)
Albumin: 4 g/dL (ref 3.5–5.0)
Alkaline Phosphatase: 52 U/L (ref 38–126)
Anion gap: 7 (ref 5–15)
BUN: 35 mg/dL — ABNORMAL HIGH (ref 8–23)
CO2: 27 mmol/L (ref 22–32)
Calcium: 9.8 mg/dL (ref 8.9–10.3)
Chloride: 102 mmol/L (ref 98–111)
Creatinine, Ser: 1.87 mg/dL — ABNORMAL HIGH (ref 0.61–1.24)
GFR, Estimated: 37 mL/min — ABNORMAL LOW (ref >60)
Glucose, Bld: 113 mg/dL — ABNORMAL HIGH (ref 70–99)
Potassium: 4.1 mmol/L (ref 3.5–5.1)
Sodium: 136 mmol/L (ref 135–145)
Total Bilirubin: 0.6 mg/dL (ref 0.3–1.2)
Total Protein: 6.9 g/dL (ref 6.5–8.1)

## 2022-08-12 LAB — CBC WITH DIFFERENTIAL/PLATELET
Abs Immature Granulocytes: 0.05 10*3/uL (ref 0.00–0.07)
Basophils Absolute: 0.1 10*3/uL (ref 0.0–0.1)
Basophils Relative: 1 %
Eosinophils Absolute: 0.6 10*3/uL — ABNORMAL HIGH (ref 0.0–0.5)
Eosinophils Relative: 8 %
HCT: 43 % (ref 39.0–52.0)
Hemoglobin: 14.9 g/dL (ref 13.0–17.0)
Immature Granulocytes: 1 %
Lymphocytes Relative: 17 %
Lymphs Abs: 1.2 10*3/uL (ref 0.7–4.0)
MCH: 30.7 pg (ref 26.0–34.0)
MCHC: 34.7 g/dL (ref 30.0–36.0)
MCV: 88.7 fL (ref 80.0–100.0)
Monocytes Absolute: 0.6 10*3/uL (ref 0.1–1.0)
Monocytes Relative: 8 %
Neutro Abs: 4.8 10*3/uL (ref 1.7–7.7)
Neutrophils Relative %: 65 %
Platelets: 212 10*3/uL (ref 150–400)
RBC: 4.85 MIL/uL (ref 4.22–5.81)
RDW: 12.2 % (ref 11.5–15.5)
WBC: 7.4 10*3/uL (ref 4.0–10.5)
nRBC: 0 % (ref 0.0–0.2)

## 2022-08-12 MED ORDER — SODIUM CHLORIDE 0.9 % IV SOLN
Freq: Once | INTRAVENOUS | Status: AC
  Filled 2022-08-12: qty 250

## 2022-08-12 MED ORDER — SODIUM CHLORIDE 0.9 % IV SOLN
200.0000 mg | Freq: Once | INTRAVENOUS | Status: AC
  Administered 2022-08-12: 200 mg via INTRAVENOUS
  Filled 2022-08-12: qty 8

## 2022-08-12 NOTE — Progress Notes (Signed)
Hematology/Oncology Progress note [phone] [phone]      Patient Care Team: Dion Body, MD as PCP - General (Family Medicine)  ASSESSMENT & PLAN:   Cancer Staging  Cancer of kidney, left [facility]) Staging form: Kidney, AJCC 8th Edition - Clinical stage from 06/25/2021: Stage III (cT3a, cNX, cM0) - Signed by [name], MD on 08/06/2021   Cancer of kidney, left [facility]) History of stage III left RCC, Lytic bone lesion of L2 and intertrochanteric left femur, consistent with osseous metastasis. Biopsy of L2 + metastatic carcinoma, stage IV, limited disease burden.  Results were reviewed with patient and daughter.  Labs are reviewed and discussed with patient. Proceed with Keytruda.    CKD (chronic kidney disease) stage 3, GFR 30-59 ml/min (HCC) Encourage oral hydration, avoid nephrotoxins  Skin rash Recommend topical benadryl cream PRN.   Encounter for antineoplastic immunotherapy Treatment plan listed above.  Monitor immunotherapy side effects   No orders of the defined types were placed in this encounter.   Follow up 3 weeks lab MD [facility] All questions were answered. The patient knows to call the clinic with any problems, questions or concerns.  [name], MD, PhD [facility] Hematology Oncology 08/12/2022   CHIEF COMPLAINTS/REASON FOR VISIT:  RCC  HISTORY OF PRESENTING ILLNESS:   Peter Stuart is a  76 y.o.  male presents for follow-up of history of left RCC and history of prostate cancer. Oncology history summary listed as below. Oncology History  Cancer of kidney, left ([place])  2009 Initial Diagnosis   History of prostate cancer treated with robotic prostatectomy initial   04/27/2021 Imaging   CT abdomen pelvis showed acute appendicitis without evidence of perforation or abscess.  Large left renal mass consistent with renal malignancy with renal vein invasion.  Focal annular thickening of descending colon.  05/01/2021 CT chest with  contrast showed large sliding-type hiatal hernia.  Mild bilateral posterior bibasilar subsegmental atelectasis   06/25/2021 Initial Diagnosis   Cancer of kidney, left  06/25/2021, patient underwent radical nephrectomy by Dr. Diamantina Providence. Pathology showed clear-cell renal cell carcinoma, nuclear grade 2.  Benign adrenal tissue.  Lymphovascular invasion present. pT3a pNx    06/25/2021 Cancer Staging   Staging form: Kidney, AJCC 8th Edition - Clinical stage from 06/25/2021: Stage III (cT3a, cNX, cM0) - Signed by [name], MD on 08/06/2021 Stage prefix: Initial diagnosis   05/29/2022 Imaging   CT chest abdomen pelvis with contrast showed -1. New lytic lesions in the L2 vertebral body and intertrochanteric left femur are most consistent with osseous metastatic disease. 2. Prior left nephrectomy with unchanged size of the soft tissue and fluid collection in the nephrectomy bed favored to reflect postsurgical change given stability. Continued attention on follow-up imaging suggested. 3. Small lucent focus in the T11 vertebral body is nonspecific but stable from prior imaging suggestive of a benign etiology, attention on follow-up imaging suggested. 4. Stable small bilateral pulmonary nodules, continued attention on follow-up imaging suggested. 5. Large hiatal hernia/intrathoracic stomach also containing a portion of the pancreatic body. 6. Prior prostatectomy without suspicious enhancing nodularity in the prostatectomy bed.   07/15/2022 Procedure   L2 vertebral body CT guided biopsy showed positive for malignancy, metastatic carcinoma, morphologically compatible with known clear cell renal cell carcinoma.    07/19/2022 -  Chemotherapy   RENAL CELL Pembrolizumab (200)       INTERVAL HISTORY Peter Stuart is a 76 y.o. male who has above history reviewed by me today presents for follow up visit  for metastatic RCC He feels well. + skin rash intermittently.  + constipation.   Review of Systems   Constitutional:  Negative for appetite change, chills, fever and unexpected weight change.  HENT:   Negative for hearing loss and voice change.   Eyes:  Negative for eye problems and icterus.  Respiratory:  Negative for chest tightness, cough and shortness of breath.   Cardiovascular:  Negative for chest pain and leg swelling.  Gastrointestinal:  Negative for abdominal distention and abdominal pain.  Endocrine: Negative for hot flashes.  Genitourinary:  Negative for difficulty urinating, dysuria and frequency.   Musculoskeletal:  Positive for back pain. Negative for arthralgias.  Skin:  Positive for rash. Negative for itching.  Neurological:  Negative for light-headedness and numbness.  Hematological:  Negative for adenopathy. Does not bruise/bleed easily.  Psychiatric/Behavioral:  Negative for confusion.     MEDICAL HISTORY:  Past Medical History:  Diagnosis Date   Anemia    Cancer ([place])    Complication of anesthesia    slow to wake after 1 surgery   CPAP use counseling    HTN (hypertension)    OSA on CPAP    Prostate cancer (HCC)    Seasonal allergies    Skin cancer     SURGICAL HISTORY: Past Surgical History:  Procedure Laterality Date   HERNIA REPAIR     IR FLUORO GUIDED NEEDLE PLC ASPIRATION/INJECTION LOC  07/02/2022   LAPAROSCOPIC APPENDECTOMY N/A 04/27/2021   Procedure: APPENDECTOMY LAPAROSCOPIC;  Surgeon: Fredirick Maudlin, MD;  Location: [facility] ORS;  Service: General;  Laterality: N/A;   LAPAROSCOPIC NEPHRECTOMY, HAND ASSISTED Left 06/25/2021   Procedure: HAND ASSISTED LAPAROSCOPIC NEPHRECTOMY;  Surgeon: Billey Co, MD;  Location: [facility] ORS;  Service: Urology;  Laterality: Left;   prostatectomy     REPAIR KNEE LIGAMENT     SHOULDER ARTHROSCOPY WITH SUBACROMIAL DECOMPRESSION AND OPEN ROTATOR C Right 12/29/2020   Procedure: Right shoulder arthroscopic rotator cuff repair, subacromial decompression, and biceps tenodesis;  Surgeon: Leim Fabry, MD;  Location: [place];  Service: Orthopedics;  Laterality: Right;   TONSILLECTOMY     VASECTOMY      SOCIAL HISTORY: Social History   Socioeconomic History   Marital status: Widowed    Spouse name: Not on file   Number of children: Not on file   Years of education: Not on file   Highest education level: Not on file  Occupational History   Not on file  Tobacco Use   Smoking status: Never    Passive exposure: Never   Smokeless tobacco: Never  Vaping Use   Vaping Use: Never used  Substance and Sexual Activity   Alcohol use: Not Currently   Drug use: Never   Sexual activity: Not Currently  Other Topics Concern   Not on file  Social History Narrative   Not on file   Social Determinants of Health   Financial Resource Strain: Not on file  Food Insecurity: Not on file  Transportation Needs: Not on file  Physical Activity: Not on file  Stress: Not on file  Social Connections: Not on file  Intimate Partner Violence: Not on file    FAMILY HISTORY: Family History  Problem Relation Age of Onset   Stroke Mother    Hypertension Father    Emphysema Father     ALLERGIES:  has No Known Allergies.  MEDICATIONS:  Current Outpatient Medications  Medication Sig Dispense Refill   ALLEGRA ALLERGY 60 MG tablet Take 60 mg by  mouth daily.     cholecalciferol (VITAMIN D3) 25 MCG (1000 UNIT) tablet Take 1,000 Units by mouth daily.     clotrimazole (LOTRIMIN) 1 % cream Apply topically 2 (two) times daily.     Coenzyme Q10 10 MG capsule Take 10 mg by mouth daily.     diphenhydrAMINE-Zinc Acetate (BENADRYL ITCH RELIEF EX) Apply topically.     ezetimibe (ZETIA) 10 MG tablet Take 1 tablet by mouth daily.     fluticasone (FLONASE) 50 MCG/ACT nasal spray Place into the nose.     gabapentin (NEURONTIN) 400 MG capsule Take 600 mg by mouth at bedtime.     GINKGO BILOBA COMPLEX PO Take by mouth.     hydrochlorothiazide (HYDRODIURIL) 25 MG tablet Take 1 tablet by mouth daily.     losartan (COZAAR) 100  MG tablet Take 100 mg by mouth daily.     Multiple Vitamin (MULTIVITAMIN) capsule Take 1 capsule by mouth daily.     multivitamin-lutein (OCUVITE-LUTEIN) CAPS capsule Take 1 capsule by mouth daily.     Omega-3 Fatty Acids (FISH OIL) 1000 MG CAPS Take 1 capsule by mouth 1 day or 1 dose.     terbinafine (LAMISIL) 1 % cream Apply 1 application topically daily as needed (irritation).     No current facility-administered medications for this visit.     PHYSICAL EXAMINATION: ECOG PERFORMANCE STATUS: 0 - Asymptomatic Vitals:   08/12/22 0919  BP: 104/83  Pulse: 71  Resp: 17  Temp: (!) 97.3 F (36.3 C)  SpO2: 97%   Filed Weights   08/12/22 0919  Weight: 244 lb 8 oz (110.9 kg)    Physical Exam Constitutional:      General: He is not in acute distress.    Appearance: He is obese.  HENT:     Head: Normocephalic and atraumatic.  Eyes:     General: No scleral icterus. Cardiovascular:     Rate and Rhythm: Normal rate and regular rhythm.     Heart sounds: Normal heart sounds.  Pulmonary:     Effort: Pulmonary effort is normal. No respiratory distress.     Breath sounds: No wheezing.  Abdominal:     General: Bowel sounds are normal. There is no distension.     Palpations: Abdomen is soft.  Musculoskeletal:        General: No deformity. Normal range of motion.     Cervical back: Normal range of motion and neck supple.  Skin:    General: Skin is warm and dry.     Findings: No erythema or rash.  Neurological:     Mental Status: He is alert and oriented to person, place, and time. Mental status is at baseline.     Cranial Nerves: No cranial nerve deficit.     Coordination: Coordination normal.  Psychiatric:        Mood and Affect: Mood normal.     LABORATORY DATA:  I have reviewed the data as listed    Latest Ref Rng & Units 08/12/2022    9:02 AM 07/22/2022    9:04 AM 07/02/2022    9:13 AM  CBC  WBC 4.0 - 10.5 K/uL 7.4  6.0  7.3   Hemoglobin 13.0 - 17.0 g/dL 14.9  14.5   14.9   Hematocrit 39.0 - 52.0 % 43.0  41.8  43.8   Platelets 150 - 400 K/uL 212  201  189       Latest Ref Rng & Units 08/12/2022    9:02  AM 07/22/2022    9:04 AM 11/29/2021    8:19 AM  CMP  Glucose 70 - 99 mg/dL 113  117    BUN 8 - 23 mg/dL 35  35    Creatinine 0.61 - 1.24 mg/dL 1.87  1.82  1.90   Sodium 135 - 145 mmol/L 136  137    Potassium 3.5 - 5.1 mmol/L 4.1  3.6    Chloride 98 - 111 mmol/L 102  105    CO2 22 - 32 mmol/L 27  27    Calcium 8.9 - 10.3 mg/dL 9.8  8.8    Total Protein 6.5 - 8.1 g/dL 6.9  6.6    Total Bilirubin 0.3 - 1.2 mg/dL 0.6  0.6    Alkaline Phos 38 - 126 U/L 52  52    AST 15 - 41 U/L 24  17    ALT 0 - 44 U/L 31  17      RADIOGRAPHIC STUDIES: I have personally reviewed the radiological images as listed and agreed with the findings in the report. No results found.

## 2022-08-12 NOTE — Patient Instructions (Signed)
[facility] CTR AT [place]  Discharge Instructions: Thank you for choosing [facility] to provide your oncology and hematology care.  If you have a lab appointment with the [facility], please go directly to the [facility] and check in at the registration area.  Wear comfortable clothing and clothing appropriate for easy access to any Portacath or PICC line.   We strive to give you quality time with your provider. You may need to reschedule your appointment if you arrive late (15 or more minutes).  Arriving late affects you and other patients whose appointments are after yours.  Also, if you miss three or more appointments without notifying the office, you may be dismissed from the clinic at the provider's discretion.      For prescription refill requests, have your pharmacy contact our office and allow 72 hours for refills to be completed.    Today you received the following chemotherapy and/or immunotherapy agents [name]      To help prevent nausea and vomiting after your treatment, we encourage you to take your nausea medication as directed.  BELOW ARE SYMPTOMS THAT SHOULD BE REPORTED IMMEDIATELY: *FEVER GREATER THAN 100.4 F (38 C) OR HIGHER *CHILLS OR SWEATING *NAUSEA AND VOMITING THAT IS NOT CONTROLLED WITH YOUR NAUSEA MEDICATION *UNUSUAL SHORTNESS OF BREATH *UNUSUAL BRUISING OR BLEEDING *URINARY PROBLEMS (pain or burning when urinating, or frequent urination) *BOWEL PROBLEMS (unusual diarrhea, constipation, pain near the anus) TENDERNESS IN MOUTH AND THROAT WITH OR WITHOUT PRESENCE OF ULCERS (sore throat, sores in mouth, or a toothache) UNUSUAL RASH, SWELLING OR PAIN  UNUSUAL VAGINAL DISCHARGE OR ITCHING   Items with * indicate a potential emergency and should be followed up as soon as possible or go to the Emergency Department if any problems should occur.  Please show the CHEMOTHERAPY ALERT CARD or IMMUNOTHERAPY ALERT CARD at check-in to  the Emergency Department and triage nurse.  Should you have questions after your visit or need to cancel or reschedule your appointment, please contact [facility] CANCER [place] AT [place]  [phone] and follow the prompts.  Office hours are 8:00 a.m. to 4:30 p.m. Monday - Friday. Please note that voicemails left after 4:00 p.m. may not be returned until the following business day.  We are closed weekends and major holidays. You have access to a nurse at all times for urgent questions. Please call the main number to the clinic [phone] and follow the prompts.  For any non-urgent questions, you may also contact your provider using MyChart. We now offer e-Visits for anyone 24 and older to request care online for non-urgent symptoms. For details visit [email or web address].   Also download the MyChart app! Go to the app store, search "MyChart", open the app, select [facility], and log in with your MyChart username and password.  Masks are optional in the cancer centers. If you would like for your care team to wear a mask while they are taking care of you, please let them know. For doctor visits, patients may have with them one support person who is at least 78 years old. At this time, visitors are not allowed in the infusion area.  Pembrolizumab Injection What is this medication? PEMBROLIZUMAB (PEM broe LIZ ue mab) treats some types of cancer. It works by helping your immune system slow or stop the spread of cancer cells. It is a monoclonal antibody. This medicine may be used for other purposes; ask your health care provider or pharmacist if you have questions.  COMMON BRAND NAME(S): Keytruda What should I tell my care team before I take this medication? They need to know if you have any of these conditions: Allogeneic stem cell transplant (uses someone else's stem cells) Autoimmune diseases, such as Crohn disease, ulcerative colitis, lupus History of chest radiation Nervous system  problems, such as Guillain-Barre syndrome, myasthenia gravis Organ transplant An unusual or allergic reaction to pembrolizumab, other medications, foods, dyes, or preservatives Pregnant or trying to get pregnant Breast-feeding How should I use this medication? This medication is injected into a vein. It is given by your care team in a hospital or clinic setting. A special MedGuide will be given to you before each treatment. Be sure to read this information carefully each time. Talk to your care team about the use of this medication in children. While it may be prescribed for children as young as 6 months for selected conditions, precautions do apply. Overdosage: If you think you have taken too much of this medicine contact a poison control center or emergency room at once. NOTE: This medicine is only for you. Do not share this medicine with others. What if I miss a dose? Keep appointments for follow-up doses. It is important not to miss your dose. Call your care team if you are unable to keep an appointment. What may interact with this medication? Interactions have not been studied. This list may not describe all possible interactions. Give your health care provider a list of all the medicines, herbs, non-prescription drugs, or dietary supplements you use. Also tell them if you smoke, drink alcohol, or use illegal drugs. Some items may interact with your medicine. What should I watch for while using this medication? Your condition will be monitored carefully while you are receiving this medication. You may need blood work while taking this medication. This medication may cause serious skin reactions. They can happen weeks to months after starting the medication. Contact your care team right away if you notice fevers or flu-like symptoms with a rash. The rash may be red or purple and then turn into blisters or peeling of the skin. You may also notice a red rash with swelling of the face, lips, or  lymph nodes in your neck or under your arms. Tell your care team right away if you have any change in your eyesight. Talk to your care team if you may be pregnant. Serious birth defects can occur if you take this medication during pregnancy and for 4 months after the last dose. You will need a negative pregnancy test before starting this medication. Contraception is recommended while taking this medication and for 4 months after the last dose. Your care team can help you find the option that works for you. Do not breastfeed while taking this medication and for 4 months after the last dose. What side effects may I notice from receiving this medication? Side effects that you should report to your care team as soon as possible: Allergic reactions--skin rash, itching, hives, swelling of the face, lips, tongue, or throat Dry cough, shortness of breath or trouble breathing Eye pain, redness, irritation, or discharge with blurry or decreased vision Heart muscle inflammation--unusual weakness or fatigue, shortness of breath, chest pain, fast or irregular heartbeat, dizziness, swelling of the ankles, feet, or hands Hormone gland problems--headache, sensitivity to light, unusual weakness or fatigue, dizziness, fast or irregular heartbeat, increased sensitivity to cold or heat, excessive sweating, constipation, hair loss, increased thirst or amount of urine, tremors or shaking, irritability Infusion  reactions--chest pain, shortness of breath or trouble breathing, feeling faint or lightheaded Kidney injury (glomerulonephritis)--decrease in the amount of urine, red or dark brown urine, foamy or bubbly urine, swelling of the ankles, hands, or feet Liver injury--right upper belly pain, loss of appetite, nausea, light-colored stool, dark yellow or brown urine, yellowing skin or eyes, unusual weakness or fatigue Pain, tingling, or numbness in the hands or feet, muscle weakness, change in vision, confusion or trouble  speaking, loss of balance or coordination, trouble walking, seizures Rash, fever, and swollen lymph nodes Redness, blistering, peeling, or loosening of the skin, including inside the mouth Sudden or severe stomach pain, bloody diarrhea, fever, nausea, vomiting Side effects that usually do not require medical attention (report to your care team if they continue or are bothersome): Bone, joint, or muscle pain Diarrhea Fatigue Loss of appetite Nausea Skin rash This list may not describe all possible side effects. Call your doctor for medical advice about side effects. You may report side effects to FDA at 1-800-FDA-1088. Where should I keep my medication? This medication is given in a hospital or clinic. It will not be stored at home. NOTE: This sheet is a summary. It may not cover all possible information. If you have questions about this medicine, talk to your doctor, pharmacist, or health care provider.  2023 Elsevier/Gold Standard (2022-02-04 00:00:00)

## 2022-08-12 NOTE — Assessment & Plan Note (Signed)
Recommend topical benadryl cream PRN.  

## 2022-08-12 NOTE — Progress Notes (Signed)
Patient here for oncology follow-up appointment,  concerns of constipation. Rash improving

## 2022-08-12 NOTE — Assessment & Plan Note (Signed)
Encourage oral hydration, avoid nephrotoxins 

## 2022-08-12 NOTE — Assessment & Plan Note (Signed)
Treatment plan listed above.  Monitor immunotherapy side effects 

## 2022-08-12 NOTE — Assessment & Plan Note (Signed)
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 Biopsy of L2 + metastatic carcinoma, stage IV, limited disease burden.  Results were reviewed with patient and daughter.  Labs are reviewed and discussed with patient. Proceed with Keytruda.

## 2022-08-15 NOTE — Progress Notes (Signed)
[facility] [place], [place] [address]  Pulmonary Sleep Medicine   Office Visit Note  Patient Name: Peter Stuart DOB: Feb 06, 1946 MRN [id number]    Chief Complaint: Obstructive Sleep Apnea visit  Brief History:  Peter Stuart is seen today for an annual follow up on APAP at 5-15 cmh20. The patient has a 13 year history of sleep apnea. Patient is using PAP nightly.  The patient feels rested after sleeping with PAP.  The patient reports benefit from PAP use. Reported sleepiness is  improved and the Epworth Sleepiness Score is 4 out of 24. The patient occasionally will take a nap for 30-40 min. The patient complains of the following: some redness on his nose from mask. We discussed over tightening and changing his seals on schedule to assist with this. Also discussed humidity adjustments to alleviate nasal congestion.  The compliance download shows 99% compliance with an average use time of 7 hours 38 minutes. The AHI is 4.8.  The patient does not notice limb movements disrupting sleep, although he had significant PLMs on his study in the past.   ROS  General: (-) fever, (-) chills, (-) night sweat Nose and Sinuses: (-) nasal stuffiness or itchiness, (-) postnasal drip, (-) nosebleeds, (-) sinus trouble. Mouth and Throat: (-) sore throat, (-) hoarseness. Neck: (-) swollen glands, (-) enlarged thyroid, (-) neck pain. Respiratory: - cough, - shortness of breath, - wheezing. Neurologic: - numbness, - tingling. Psychiatric: - anxiety, - depression   Current Medication: Outpatient Encounter Medications as of 08/19/2022  Medication Sig   ALLEGRA ALLERGY 60 MG tablet Take 60 mg by mouth daily.   cholecalciferol (VITAMIN D3) 25 MCG (1000 UNIT) tablet Take 1,000 Units by mouth daily.   clotrimazole (LOTRIMIN) 1 % cream Apply topically 2 (two) times daily.   Coenzyme Q10 10 MG capsule Take 10 mg by mouth daily.   diphenhydrAMINE-Zinc Acetate (BENADRYL ITCH RELIEF EX) Apply  topically.   ezetimibe (ZETIA) 10 MG tablet Take 1 tablet by mouth daily.   fluticasone (FLONASE) 50 MCG/ACT nasal spray Place into the nose.   gabapentin (NEURONTIN) 400 MG capsule Take 600 mg by mouth at bedtime.   GINKGO BILOBA COMPLEX PO Take by mouth.   hydrochlorothiazide (HYDRODIURIL) 25 MG tablet Take 1 tablet by mouth daily.   losartan (COZAAR) 100 MG tablet Take 100 mg by mouth daily.   Multiple Vitamin (MULTIVITAMIN) capsule Take 1 capsule by mouth daily.   multivitamin-lutein (OCUVITE-LUTEIN) CAPS capsule Take 1 capsule by mouth daily.   Omega-3 Fatty Acids (FISH OIL) 1000 MG CAPS Take 1 capsule by mouth 1 day or 1 dose.   Pembrolizumab (KEYTRUDA IV) Inject into the vein.   terbinafine (LAMISIL) 1 % cream Apply 1 application topically daily as needed (irritation).   No facility-administered encounter medications on file as of 08/19/2022.    Surgical History: Past Surgical History:  Procedure Laterality Date   HERNIA REPAIR     IR FLUORO GUIDED NEEDLE PLC ASPIRATION/INJECTION LOC  07/02/2022   LAPAROSCOPIC APPENDECTOMY N/A 04/27/2021   Procedure: APPENDECTOMY LAPAROSCOPIC;  Surgeon: Fredirick Maudlin, MD;  Location: [facility] ORS;  Service: General;  Laterality: N/A;   LAPAROSCOPIC NEPHRECTOMY, HAND ASSISTED Left 06/25/2021   Procedure: HAND ASSISTED LAPAROSCOPIC NEPHRECTOMY;  Surgeon: Billey Co, MD;  Location: [facility] ORS;  Service: Urology;  Laterality: Left;   prostatectomy     REPAIR KNEE LIGAMENT     SHOULDER ARTHROSCOPY WITH SUBACROMIAL DECOMPRESSION AND OPEN ROTATOR C Right 12/29/2020   Procedure:  Right shoulder arthroscopic rotator cuff repair, subacromial decompression, and biceps tenodesis;  Surgeon: Leim Fabry, MD;  Location: [place];  Service: Orthopedics;  Laterality: Right;   TONSILLECTOMY     VASECTOMY      Medical History: Past Medical History:  Diagnosis Date   Anemia    Cancer (Advance)    Complication of anesthesia    slow to wake after 1 surgery    CPAP use counseling    HTN (hypertension)    OSA on CPAP    Prostate cancer (HCC)    Seasonal allergies    Skin cancer     Family History: Non contributory to the present illness  Social History: Social History   Socioeconomic History   Marital status: Widowed    Spouse name: Not on file   Number of children: Not on file   Years of education: Not on file   Highest education level: Not on file  Occupational History   Not on file  Tobacco Use   Smoking status: Never    Passive exposure: Never   Smokeless tobacco: Never  Vaping Use   Vaping Use: Never used  Substance and Sexual Activity   Alcohol use: Not Currently   Drug use: Never   Sexual activity: Not Currently  Other Topics Concern   Not on file  Social History Narrative   Not on file   Social Determinants of Health   Financial Resource Strain: Not on file  Food Insecurity: Not on file  Transportation Needs: Not on file  Physical Activity: Not on file  Stress: Not on file  Social Connections: Not on file  Intimate Partner Violence: Not on file    Vital Signs: Blood pressure 112/73, pulse 61, resp. rate 16, height 5' 9"  (1.753 m), weight 247 lb 3.2 oz (112.1 kg), SpO2 97 %. Body mass index is 36.51 kg/m.    Examination: General Appearance: The patient is well-developed, well-nourished, and in no distress. Neck Circumference: 48cm Skin: Gross inspection of skin unremarkable. Head: normocephalic, no gross deformities. Eyes: no gross deformities noted. ENT: ears appear grossly normal Neurologic: Alert and oriented. No involuntary movements.  STOP BANG RISK ASSESSMENT S (snore) Have you been told that you snore?     No   T (tired) Are you often tired, fatigued, or sleepy during the day?   NO  O (obstruction) Do you stop breathing, choke, or gasp during sleep? NO   P (pressure) Do you have or are you being treated for high blood pressure? YES   B (BMI) Is your body index greater than 35 kg/m?  YES   A (age) Are you 78 years old or older? YES   N (neck) Do you have a neck circumference greater than 16 inches?   YES   G (gender) Are you a male? YES   TOTAL STOP/BANG "YES" ANSWERS 5       A STOP-Bang score of 2 or less is considered low risk, and a score of 5 or more is high risk for having either moderate or severe OSA. For people who score 3 or 4, doctors may need to perform further assessment to determine how likely they are to have OSA.         EPWORTH SLEEPINESS SCALE:  Scale:  (0)= no chance of dozing; (1)= slight chance of dozing; (2)= moderate chance of dozing; (3)= high chance of dozing  Chance  Situtation    Sitting and reading: 1    Watching TV:  2    Sitting Inactive in public: 0    As a passenger in car: 1      Lying down to rest: 0    Sitting and talking: 0    Sitting quielty after lunch: 0    In a car, stopped in traffic: 0   TOTAL SCORE:   4 out of 24    SLEEP STUDIES:  SPLIT (02/09/09)  AHI 42.4, MIN SPO2 75%   CPAP COMPLIANCE DATA:  Date Range: 08/15/2021 - 08/14/2022  Average Daily Use: 7 hours 38 minutes  Median Use: 7 hours 44 minutes  Compliance for > 4 Hours: 362 days  AHI: 4.8 respiratory events per hour  Days Used: 362/365  Mask Leak: 32.4  95th Percentile Pressure: 10.8 cmh20         LABS: Recent Results (from the past 2160 hour(s))  Multiple Myeloma Panel (SPEP&IFE w/QIG)     Status: Abnormal   Collection Time: 06/03/22 11:25 AM  Result Value Ref Range   IgG (Immunoglobin G), Serum 820 603 - 1,613 mg/dL   IgA 75 61 - 437 mg/dL   IgM (Immunoglobulin M), Srm 42 15 - 143 mg/dL   Total Protein ELP 6.5 6.0 - 8.5 g/dL   Albumin SerPl Elph-Mcnc 4.1 2.9 - 4.4 g/dL   Alpha 1 0.2 0.0 - 0.4 g/dL   Alpha2 Glob SerPl Elph-Mcnc 0.5 0.4 - 1.0 g/dL   B-Globulin SerPl Elph-Mcnc 0.9 0.7 - 1.3 g/dL   Gamma Glob SerPl Elph-Mcnc 0.8 0.4 - 1.8 g/dL   M Protein SerPl Elph-Mcnc Not Observed Not Observed g/dL   Globulin,  Total 2.4 2.2 - 3.9 g/dL   Albumin/Glob SerPl 1.8 (H) 0.7 - 1.7   IFE 1 Comment     Comment: (NOTE) The immunofixation pattern appears unremarkable. Evidence of monoclonal protein is not apparent.    Please Note Comment     Comment: (NOTE) Protein electrophoresis scan will follow via computer, mail, or courier delivery. Performed At: [facility] [place], [place] [id number] Rush Farmer MD JW:[id number]   Kappa/lambda light chains     Status: Abnormal   Collection Time: 06/03/22 11:25 AM  Result Value Ref Range   Kappa free light chain 39.3 (H) 3.3 - 19.4 mg/L   Lambda free light chains 16.1 5.7 - 26.3 mg/L   Kappa, lambda light chain ratio 2.44 (H) 0.26 - 1.65    Comment: (NOTE) Performed At: [facility] [place], [place] [id number] Rush Farmer MD [id number]   Lactate dehydrogenase     Status: None   Collection Time: 06/03/22 11:25 AM  Result Value Ref Range   LDH 130 98 - 192 U/L    Comment: Performed at [facility], [address] [place], [place] [address]  PSA     Status: None   Collection Time: 06/03/22 11:25 AM  Result Value Ref Range   Prostatic Specific Antigen <0.01 0.00 - 4.00 ng/mL    Comment: (NOTE) While PSA levels of <=4.0 ng/ml are reported as reference range, some men with levels below 4.0 ng/ml can have prostate cancer and many men with PSA above 4.0 ng/ml do not have prostate cancer.  Other tests such as free PSA, age specific reference ranges, PSA velocity and PSA doubling time may be helpful especially in men less than 53 years old. Performed at [facility], [place] [address] [place], [place] [address]   CBC     Status: None  Collection Time: 06/03/22 11:25 AM  Result Value Ref Range   WBC 6.9 4.0 - 10.5 K/uL   RBC 4.73 4.22 - 5.81 MIL/uL   Hemoglobin 14.6 13.0 - 17.0 g/dL   HCT 43.1 39.0 - 52.0 %   MCV 91.1 80.0 - 100.0 fL   MCH 30.9 26.0 - 34.0 pg   MCHC 33.9 30.0 - 36.0  g/dL   RDW 13.0 11.5 - 15.5 %   Platelets 212 150 - 400 K/uL   nRBC 0.0 0.0 - 0.2 %    Comment: Performed at [facility], [address] [place], [place] [address]  Frederik Schmidt, [place] UR     Status: None   Collection Time: 06/05/22 11:51 AM  Result Value Ref Range   Total Protein, Urine <4.0 Not Estab. mg/dL   Total Protein, Urine-Ur/day <120 30 - 150 mg/24 hr   Albumin, U 100.0 %   ALPHA 1 URINE 0.0 %   Alpha 2, Urine 0.0 %   % BETA, Urine 0.0 %   GAMMA GLOBULIN URINE 0.0 %   M-SPIKE %, Urine Not Observed Not Observed %   Immunofixation Result, Urine Comment     Comment: (NOTE) The immunofixation pattern appears unremarkable. Evidence of monoclonal protein is not apparent.    Note: Comment     Comment: (NOTE) Protein electrophoresis scan will follow via computer, mail, or courier delivery. Performed At: [facility] [place], [place] [id number] Rush Farmer MD GO:[id number]    Total Volume 3,000     Comment: Performed at [facility], [address] [place], [place] [address]  CBC     Status: None   Collection Time: 07/02/22  9:13 AM  Result Value Ref Range   WBC 7.3 4.0 - 10.5 K/uL   RBC 5.01 4.22 - 5.81 MIL/uL   Hemoglobin 14.9 13.0 - 17.0 g/dL   HCT 43.8 39.0 - 52.0 %   MCV 87.4 80.0 - 100.0 fL   MCH 29.7 26.0 - 34.0 pg   MCHC 34.0 30.0 - 36.0 g/dL   RDW 12.8 11.5 - 15.5 %   Platelets 189 150 - 400 K/uL   nRBC 0.0 0.0 - 0.2 %    Comment: Performed at [facility], [address] [place], [place] [address]  Protime-INR     Status: None   Collection Time: 07/02/22  9:13 AM  Result Value Ref Range   Prothrombin Time 13.3 11.4 - 15.2 seconds   INR 1.0 0.8 - 1.2    Comment: (NOTE) INR goal varies based on device and disease states. Performed at [facility], [address] [place],  [address]   Surgical pathology     Status: None   Collection Time: 07/02/22 11:17 AM  Result Value Ref  Range   SURGICAL PATHOLOGY      SURGICAL PATHOLOGY CASE: [phone] [phone] PATIENT: Elvis Coil Surgical Pathology Report     Specimen Submitted: A. L2 vertebral body core; bx  Clinical History: RCC    DIAGNOSIS: A. BONE, L2 VERTEBRAL BODY; CT-GUIDED BIOPSY: - POSITIVE FOR MALIGNANCY. 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  Comment: There is sufficient tissue present for ancillary testing if desired, and the tissue has not undergone decalcification.  GROSS DESCRIPTION: A. Labeled: L2 Received: Formalin Collection time: 11:17 AM on 07/02/2022 Placed into formalin time: 11:17 AM on 07/02/2022 Tissue fragment(s): Multiple Size: Aggregate, 2.6 x 1.3 x 0.3 cm Description: Received are fragments of  red-brown blood clot and a scant amount of admixed tan soft potential bone. Entirely submitted in 1 cassette.  RB 07/02/2022  Final Diagnosis performed by Allena Napoleon, MD.   Electronically signed 07/03/2022 8:20:11AM The electronic sig nature indicates that the named Attending Pathologist has evaluated the specimen Technical component performed at [facility], [address] [place], [place] [address] Lab: [phone] Dir: Rush Farmer, MD, MMM  Professional component performed at [facility], [facility], [address], [place], [place] [address] Lab: [phone] Dir: Kathi Simpers, MD   TSH     Status: None   Collection Time: 07/22/22  9:04 AM  Result Value Ref Range   TSH 1.197 0.350 - 4.500 uIU/mL    Comment: Performed by a 3rd Generation assay with a functional sensitivity of <=0.01 uIU/mL. Performed at [facility], [address] [place], [place] [address]   Comprehensive metabolic panel     Status: Abnormal   Collection Time: 07/22/22  9:04 AM  Result Value Ref Range   Sodium 137 135 - 145 mmol/L   Potassium 3.6 3.5 - 5.1 mmol/L   Chloride 105 98 - 111 mmol/L   CO2 27 22 - 32  mmol/L   Glucose, Bld 117 (H) 70 - 99 mg/dL    Comment: Glucose reference range applies only to samples taken after fasting for at least 8 hours.   BUN 35 (H) 8 - 23 mg/dL   Creatinine, Ser 1.82 (H) 0.61 - 1.24 mg/dL   Calcium 8.8 (L) 8.9 - 10.3 mg/dL   Total Protein 6.6 6.5 - 8.1 g/dL   Albumin 3.9 3.5 - 5.0 g/dL   AST 17 15 - 41 U/L   ALT 17 0 - 44 U/L   Alkaline Phosphatase 52 38 - 126 U/L   Total Bilirubin 0.6 0.3 - 1.2 mg/dL   GFR, Estimated 38 (L) >60 mL/min    Comment: (NOTE) Calculated using the CKD-EPI Creatinine Equation (2021)    Anion gap 5 5 - 15    Comment: Performed at [facility], [address] [place], [place] [address]  CBC with Differential     Status: Abnormal   Collection Time: 07/22/22  9:04 AM  Result Value Ref Range   WBC 6.0 4.0 - 10.5 K/uL   RBC 4.74 4.22 - 5.81 MIL/uL   Hemoglobin 14.5 13.0 - 17.0 g/dL   HCT 41.8 39.0 - 52.0 %   MCV 88.2 80.0 - 100.0 fL   MCH 30.6 26.0 - 34.0 pg   MCHC 34.7 30.0 - 36.0 g/dL   RDW 12.6 11.5 - 15.5 %   Platelets 201 150 - 400 K/uL   nRBC 0.0 0.0 - 0.2 %   Neutrophils Relative % 58 %   Neutro Abs 3.6 1.7 - 7.7 K/uL   Lymphocytes Relative 20 %   Lymphs Abs 1.2 0.7 - 4.0 K/uL   Monocytes Relative 11 %   Monocytes Absolute 0.6 0.1 - 1.0 K/uL   Eosinophils Relative 8 %   Eosinophils Absolute 0.5 0.0 - 0.5 K/uL   Basophils Relative 1 %   Basophils Absolute 0.1 0.0 - 0.1 K/uL   Immature Granulocytes 2 %   Abs Immature Granulocytes 0.09 (H) 0.00 - 0.07 K/uL    Comment: Performed at [facility], [place], [place] [address]  T4, free     Status: None   Collection Time: 07/22/22  9:04 AM  Result Value Ref Range   Free T4 0.84 0.61 -  1.12 ng/dL    Comment: (NOTE) Biotin ingestion may interfere with free T4 tests. If the results are inconsistent with the TSH level, previous test results, or the clinical presentation, then consider biotin interference. If needed, order repeat testing  after stopping biotin. Performed at [facility], [address] [place], [place] [address]   Comprehensive metabolic panel     Status: Abnormal   Collection Time: 08/12/22  9:02 AM  Result Value Ref Range   Sodium 136 135 - 145 mmol/L   Potassium 4.1 3.5 - 5.1 mmol/L   Chloride 102 98 - 111 mmol/L   CO2 27 22 - 32 mmol/L   Glucose, Bld 113 (H) 70 - 99 mg/dL    Comment: Glucose reference range applies only to samples taken after fasting for at least 8 hours.   BUN 35 (H) 8 - 23 mg/dL   Creatinine, Ser 1.87 (H) 0.61 - 1.24 mg/dL   Calcium 9.8 8.9 - 10.3 mg/dL   Total Protein 6.9 6.5 - 8.1 g/dL   Albumin 4.0 3.5 - 5.0 g/dL   AST 24 15 - 41 U/L   ALT 31 0 - 44 U/L   Alkaline Phosphatase 52 38 - 126 U/L   Total Bilirubin 0.6 0.3 - 1.2 mg/dL   GFR, Estimated 37 (L) >60 mL/min    Comment: (NOTE) Calculated using the CKD-EPI Creatinine Equation (2021)    Anion gap 7 5 - 15    Comment: Performed at [facility], [address] [place], [place] [address]  CBC with Differential     Status: Abnormal   Collection Time: 08/12/22  9:02 AM  Result Value Ref Range   WBC 7.4 4.0 - 10.5 K/uL   RBC 4.85 4.22 - 5.81 MIL/uL   Hemoglobin 14.9 13.0 - 17.0 g/dL   HCT 43.0 39.0 - 52.0 %   MCV 88.7 80.0 - 100.0 fL   MCH 30.7 26.0 - 34.0 pg   MCHC 34.7 30.0 - 36.0 g/dL   RDW 12.2 11.5 - 15.5 %   Platelets 212 150 - 400 K/uL   nRBC 0.0 0.0 - 0.2 %   Neutrophils Relative % 65 %   Neutro Abs 4.8 1.7 - 7.7 K/uL   Lymphocytes Relative 17 %   Lymphs Abs 1.2 0.7 - 4.0 K/uL   Monocytes Relative 8 %   Monocytes Absolute 0.6 0.1 - 1.0 K/uL   Eosinophils Relative 8 %   Eosinophils Absolute 0.6 (H) 0.0 - 0.5 K/uL   Basophils Relative 1 %   Basophils Absolute 0.1 0.0 - 0.1 K/uL   Immature Granulocytes 1 %   Abs Immature Granulocytes 0.05 0.00 - 0.07 K/uL    Comment: Performed at [facility], [address] [place], [place] [address]    Radiology: IR Fluoro Guide Ndl Plmt  / BX  Result Date: 07/02/2022 INDICATION: New lytic lesion at L2, history of RCC EXAM: Core needle biopsy of L2 vertebral body using fluoroscopic guidance MEDICATIONS: None. ANESTHESIA/SEDATION: Moderate (conscious) sedation was employed during this procedure. A total of Versed 3.5 mg and Fentanyl 75 mcg was administered intravenously by the radiology nurse. Total intra-service moderate Sedation Time: 39 minutes. The patient's level of consciousness and vital signs were monitored continuously by radiology nursing throughout the procedure under my direct supervision. FLUOROSCOPY: 4.1 minutes (90 mGy) COMPLICATIONS: None immediate. PROCEDURE: Informed written consent was obtained from the patient after a thorough discussion of the procedural risks, benefits and alternatives. All questions were addressed.  Maximal Sterile Barrier Technique was utilized including caps, mask, sterile gowns, sterile gloves, sterile drape, hand hygiene and skin antiseptic. A timeout was performed prior to the initiation of the procedure. The patient was positioned prone on the exam table. A unipedicular access was planned. Skin entry site(s) were marked overlying the L2 vertebral body using fluoroscopy. The overlying skin was then prepped and draped in the standard sterile fashion. Local analgesia was obtained with 1% lidocaine. Attention was turned to the right side. Under fluoroscopic guidance, an 11 gauge introducer needle was advanced towards the margin of the pedicle. Using multiple projections, the introducer needle was advanced towards the posterior margin of the vertebral body via a transpedicular approach, aiming for the lateral aspect of the vertebral body. The inner needle was then removed, and a core needle biopsy of the vertebral body was performed using a 14 gauge core biopsy device. A small amount of tissue was retrieved and sent to the lab for analysis. Access needle was repositioned targeting the mid to inferior portion of  the lateral aspect of the vertebral body, for a total of x3 separate core biopsy passes. At the end of the procedure, the introducer cannula was removed without difficulty. Clean dressings were placed after hemostasis. The patient tolerated all aspects of the procedure well, and was transferred to recovery in stable condition. IMPRESSION: Successful core needle biopsy of the lytic lesion along the right lateral aspect of the L2 vertebral body using fluoroscopic guidance. Electronically Signed   By: Albin Felling M.D.   On: 07/02/2022 16:03    No results found.  No results found.    Assessment and Plan: Patient Active Problem List   Diagnosis Date Noted   Periodic limb movement disorder 08/19/2022   Skin rash 08/12/2022   Encounter for antineoplastic immunotherapy 07/22/2022   Goals of care, counseling/discussion 06/03/2022   CPAP use counseling 08/20/2021   Obesity (BMI 30-39.9) 08/20/2021   Type II or unspecified type diabetes mellitus without mention of complication, not stated as uncontrolled 07/30/2021   CKD (chronic kidney disease) stage 3, GFR 30-59 ml/min ([place]) 07/19/2021   History of left nephrectomy 07/08/2021   Cancer of kidney, left ([place]) 06/25/2021   Acute appendicitis 04/27/2021   Pain and swelling of lower leg, right 03/05/2021   Prostate cancer ([place]) 03/05/2021   DNR (do not resuscitate) 06/13/2020   Medicare annual wellness visit, subsequent 06/13/2020   Hip pain, bilateral 03/03/2020   Status post rotator cuff surgery 03/03/2020   Type 2 diabetes mellitus with hyperlipidemia ([place]) 02/16/2020   Obesity, morbid ([place]) 02/14/2020   OSA on CPAP 10/15/2019   Status post prostatectomy 06/19/2017   Polyneuropathy 09/17/2016   B12 deficiency 08/13/2016   Lumbar radiculopathy 06/12/2016   Mixed hyperlipidemia 11/30/2015   Essential hypertension 04/26/2015   Memory loss 02/18/2015   History of squamous cell carcinoma of skin 02/15/2015    1. OSA on CPAP The patient  does tolerate PAP and reports  benefit from PAP use. The patient was reminded how to clean equipment and advised to replace supplies more frequently to minimize leak. His apnea is borderline controlled, I suspect due to his PLMs which do not bother him. He has taken some benadryl so we discussed avoiding that if possible. . The patient was also counselled on weight loss. The compliance is excellent. The AHI is 4.8.   OSA on cpap- continue with excellent compliance with pap. CPAP continues to be medically necessary to treat this patient's OSA.  F/u  one year.    2. CPAP use counseling CPAP Counseling: had a lengthy discussion with the patient regarding the importance of PAP therapy in management of the sleep apnea. Patient appears to understand the risk factor reduction and also understands the risks associated with untreated sleep apnea. Patient will try to make a good faith effort to remain compliant with therapy. Also instructed the patient on proper cleaning of the device including the water must be changed daily if possible and use of distilled water is preferred. Patient understands that the machine should be regularly cleaned with appropriate recommended cleaning solutions that do not damage the PAP machine for example given white vinegar and water rinses. Other methods such as ozone treatment may not be as good as these simple methods to achieve cleaning.   3. Essential hypertension Hypertension Counseling:   The following hypertensive lifestyle modification were recommended and discussed:  1. Limiting alcohol intake to less than 1 oz/day of ethanol:(24 oz of beer or 8 oz of wine or 2 oz of 100-proof whiskey). 2. Take baby ASA 81 mg daily. 3. Importance of regular aerobic exercise and losing weight. 4. Reduce dietary saturated fat and cholesterol intake for overall cardiovascular health. 5. Maintaining adequate dietary potassium, calcium, and magnesium intake. 6. Regular monitoring of the  blood pressure. 7. Reduce sodium intake to less than 100 mmol/day (less than 2.3 gm of sodium or less than 6 gm of sodium choride)    4. Periodic limb movement disorder He is not bothered by this, but it may be triggering central apneas. He will avoid caffeine and benadryl if possible. He is on chemotherapy and this may be triggering. Continue to monitor.    General Counseling: I have discussed the findings of the evaluation and examination with Peter Stuart.  I have also discussed any further diagnostic evaluation thatmay be needed or ordered today. Peter Stuart verbalizes understanding of the findings of todays visit. We also reviewed his medications today and discussed drug interactions and side effects including but not limited excessive drowsiness and altered mental states. We also discussed that there is always a risk not just to him but also people around him. he has been encouraged to call the office with any questions or concerns that should arise related to todays visit.  No orders of the defined types were placed in this encounter.       I have personally obtained a history, examined the patient, evaluated laboratory and imaging results, formulated the assessment and plan and placed orders. This patient was seen today by Tressie Ellis, PA-C in collaboration with Dr. Devona Konig.   Allyne Gee, MD [facility] Diplomate ABMS Pulmonary Critical Care Medicine and Sleep Medicine

## 2022-08-19 ENCOUNTER — Ambulatory Visit (INDEPENDENT_AMBULATORY_CARE_PROVIDER_SITE_OTHER): Payer: Medicare Other | Admitting: Internal Medicine

## 2022-08-19 VITALS — BP 112/73 | HR 61 | Resp 16 | Ht 69.0 in | Wt 247.2 lb

## 2022-08-19 DIAGNOSIS — I10 Essential (primary) hypertension: Secondary | ICD-10-CM

## 2022-08-19 DIAGNOSIS — G4761 Periodic limb movement disorder: Secondary | ICD-10-CM | POA: Diagnosis not present

## 2022-08-19 DIAGNOSIS — Z7189 Other specified counseling: Secondary | ICD-10-CM

## 2022-08-19 DIAGNOSIS — G4733 Obstructive sleep apnea (adult) (pediatric): Secondary | ICD-10-CM

## 2022-08-19 NOTE — Patient Instructions (Signed)

## 2022-08-26 ENCOUNTER — Encounter (INDEPENDENT_AMBULATORY_CARE_PROVIDER_SITE_OTHER): Payer: Self-pay

## 2022-08-29 ENCOUNTER — Telehealth: Payer: Self-pay | Admitting: *Deleted

## 2022-08-29 NOTE — Telephone Encounter (Signed)
Patient called reporting that he has had left hip pain for the past week. He states he noticed it started after being on his hands and knees cleaning a stain on the carpet and got up with it aching a dull ache. The pain has persisted as a dull ache despite using heat and ice which helped temporarily only. He has not taken any oral medicine for it due to him having a nephrectomy a year ago. He is currently under treatment with Keytruda, next treatment scheduled for 09/02/22. Please advise

## 2022-08-29 NOTE — Telephone Encounter (Signed)
Per Dr. Tasia Catchings, "if patient is able to bear weight on the left hip and he has no difficulty with ambulation, then she recommended observations. He may take tylenol otc 650 mg every 6 hours to relieve pain.  Attempted to reach patient to discuss Dr. Collie Siad recommendations. Left vm - requesting patient to call back. Patient called back at 1600. Discussed with patient his current symptoms. Patient stated that he can walk at this time, but the discomfort/aches bother him when he ambulates. He has only tried head/ice packs at this time. He will try tylenol as advised by Dr. Tasia Catchings. He will call our clinic back in the morning to let us know if he was able to get relief with tylenol. Discussed that he can be evaluated by the [facility] app provider, if he is not experiencing pain relief. If he starts having weight bearing issues due to pain, he needs to call our office back.

## 2022-09-02 ENCOUNTER — Inpatient Hospital Stay: Payer: Medicare Other | Attending: Oncology

## 2022-09-02 ENCOUNTER — Ambulatory Visit
Admission: RE | Admit: 2022-09-02 | Discharge: 2022-09-02 | Disposition: A | Discharge status: Home / Self Care | Payer: Medicare Other | Source: Ambulatory Visit | Attending: Oncology | Admitting: Oncology

## 2022-09-02 ENCOUNTER — Inpatient Hospital Stay: Payer: Medicare Other

## 2022-09-02 ENCOUNTER — Encounter: Payer: Self-pay | Admitting: Oncology

## 2022-09-02 ENCOUNTER — Inpatient Hospital Stay (HOSPITAL_BASED_OUTPATIENT_CLINIC_OR_DEPARTMENT_OTHER): Payer: Medicare Other | Admitting: Oncology

## 2022-09-02 VITALS — BP 106/72 | HR 71 | Temp 98.0°F | Resp 16 | Wt 246.0 lb

## 2022-09-02 DIAGNOSIS — C642 Malignant neoplasm of left kidney, except renal pelvis: Secondary | ICD-10-CM

## 2022-09-02 DIAGNOSIS — M25552 Pain in left hip: Secondary | ICD-10-CM | POA: Insufficient documentation

## 2022-09-02 DIAGNOSIS — N1832 Chronic kidney disease, stage 3b: Secondary | ICD-10-CM

## 2022-09-02 DIAGNOSIS — C61 Malignant neoplasm of prostate: Secondary | ICD-10-CM | POA: Diagnosis not present

## 2022-09-02 DIAGNOSIS — M899 Disorder of bone, unspecified: Secondary | ICD-10-CM | POA: Diagnosis not present

## 2022-09-02 DIAGNOSIS — Z79899 Other long term (current) drug therapy: Secondary | ICD-10-CM | POA: Diagnosis not present

## 2022-09-02 DIAGNOSIS — M898X9 Other specified disorders of bone, unspecified site: Secondary | ICD-10-CM

## 2022-09-02 DIAGNOSIS — Z5112 Encounter for antineoplastic immunotherapy: Secondary | ICD-10-CM | POA: Diagnosis not present

## 2022-09-02 DIAGNOSIS — C7951 Secondary malignant neoplasm of bone: Secondary | ICD-10-CM | POA: Insufficient documentation

## 2022-09-02 LAB — COMPREHENSIVE METABOLIC PANEL
ALT: 54 U/L — ABNORMAL HIGH (ref 0–44)
AST: 31 U/L (ref 15–41)
Albumin: 3.8 g/dL (ref 3.5–5.0)
Alkaline Phosphatase: 47 U/L (ref 38–126)
Anion gap: 10 (ref 5–15)
BUN: 36 mg/dL — ABNORMAL HIGH (ref 8–23)
CO2: 24 mmol/L (ref 22–32)
Calcium: 8.7 mg/dL — ABNORMAL LOW (ref 8.9–10.3)
Chloride: 98 mmol/L (ref 98–111)
Creatinine, Ser: 1.73 mg/dL — ABNORMAL HIGH (ref 0.61–1.24)
GFR, Estimated: 40 mL/min — ABNORMAL LOW (ref >60)
Glucose, Bld: 230 mg/dL — ABNORMAL HIGH (ref 70–99)
Potassium: 3.8 mmol/L (ref 3.5–5.1)
Sodium: 132 mmol/L — ABNORMAL LOW (ref 135–145)
Total Bilirubin: 0.4 mg/dL (ref 0.3–1.2)
Total Protein: 6.6 g/dL (ref 6.5–8.1)

## 2022-09-02 LAB — CBC WITH DIFFERENTIAL/PLATELET
Abs Immature Granulocytes: 0.03 10*3/uL (ref 0.00–0.07)
Basophils Absolute: 0.1 10*3/uL (ref 0.0–0.1)
Basophils Relative: 1 %
Eosinophils Absolute: 0.6 10*3/uL — ABNORMAL HIGH (ref 0.0–0.5)
Eosinophils Relative: 9 %
HCT: 40.4 % (ref 39.0–52.0)
Hemoglobin: 13.9 g/dL (ref 13.0–17.0)
Immature Granulocytes: 0 %
Lymphocytes Relative: 19 %
Lymphs Abs: 1.3 10*3/uL (ref 0.7–4.0)
MCH: 30.7 pg (ref 26.0–34.0)
MCHC: 34.4 g/dL (ref 30.0–36.0)
MCV: 89.2 fL (ref 80.0–100.0)
Monocytes Absolute: 0.5 10*3/uL (ref 0.1–1.0)
Monocytes Relative: 8 %
Neutro Abs: 4.3 10*3/uL (ref 1.7–7.7)
Neutrophils Relative %: 63 %
Platelets: 187 10*3/uL (ref 150–400)
RBC: 4.53 MIL/uL (ref 4.22–5.81)
RDW: 12.7 % (ref 11.5–15.5)
WBC: 6.8 10*3/uL (ref 4.0–10.5)
nRBC: 0 % (ref 0.0–0.2)

## 2022-09-02 LAB — T4, FREE: Free T4: 0.85 ng/dL (ref 0.61–1.12)

## 2022-09-02 LAB — TSH: TSH: 0.785 u[IU]/mL (ref 0.350–4.500)

## 2022-09-02 MED ORDER — SODIUM CHLORIDE 0.9 % IV SOLN
Freq: Once | INTRAVENOUS | Status: AC
  Filled 2022-09-02: qty 250

## 2022-09-02 MED ORDER — SODIUM CHLORIDE 0.9 % IV SOLN
200.0000 mg | Freq: Once | INTRAVENOUS | Status: AC
  Administered 2022-09-02: 200 mg via INTRAVENOUS
  Filled 2022-09-02: qty 8

## 2022-09-02 NOTE — Progress Notes (Signed)
Worsening left hip pain, 2/10 pain scale, for past 2 weeks with slight relief when taking OTC Tylenol.

## 2022-09-02 NOTE — Assessment & Plan Note (Signed)
Check left hip xray.  He has left femur lesion on previous CT If xray negative, plan to obtain MRI for further evaluation.

## 2022-09-02 NOTE — Assessment & Plan Note (Signed)
PSA is less than 0.01.

## 2022-09-02 NOTE — Assessment & Plan Note (Signed)
Encourage oral hydration, avoid nephrotoxins 

## 2022-09-02 NOTE — Progress Notes (Signed)
Hematology/Oncology Progress note [phone] [phone]      Patient Care Team: Dion Body, MD as PCP - General (Family Medicine)  ASSESSMENT & PLAN:   Cancer Staging  Cancer of kidney, left [facility]) Staging form: Kidney, AJCC 8th Edition - Clinical stage from 06/25/2021: Stage III (cT3a, cNX, cM0) - Signed by [name], MD on 08/06/2021   Cancer of kidney, left [facility]) History of stage III left RCC, Lytic bone lesion of L2 and intertrochanteric left femur, consistent with osseous metastasis. Biopsy of L2 + metastatic carcinoma, stage IV, limited disease burden.  Currently on palliative immunotherapy Labs are reviewed and discussed with patient. Proceed with Keytruda.    Prostate cancer ([place]) PSA is less than 0.01.    Encounter for antineoplastic immunotherapy Treatment plan listed above.  Monitor immunotherapy side effects  CKD (chronic kidney disease) stage 3, GFR 30-59 ml/min (HCC) Encourage oral hydration, avoid nephrotoxins  Left hip pain Check left hip xray.  He has left femur lesion on previous CT If xray negative, plan to obtain MRI for further evaluation.    Orders Placed This Encounter  Procedures   DG HIP UNILAT W OR W/O PELVIS 2-3 VIEWS LEFT    Standing Status:   Future    Number of Occurrences:   1    Standing Expiration Date:   09/03/2023    Order Specific Question:   Reason for Exam (SYMPTOM  OR DIAGNOSIS REQUIRED)    Answer:   Left hip pain    Order Specific Question:   Preferred imaging location?    Answer:   [facility]     Follow up 3 weeks lab MD [facility] All questions were answered. The patient knows to call the clinic with any problems, questions or concerns.  [name], MD, PhD [facility] Hematology Oncology 09/02/2022   CHIEF COMPLAINTS/REASON FOR VISIT:  RCC  HISTORY OF PRESENTING ILLNESS:   Peter Stuart is a  76 y.o.  male presents for follow-up of history of left RCC and history of prostate  cancer. Oncology history summary listed as below. Oncology History  Cancer of kidney, left ([place])  2009 Initial Diagnosis   History of prostate cancer treated with robotic prostatectomy initial   04/27/2021 Imaging   CT abdomen pelvis showed acute appendicitis without evidence of perforation or abscess.  Large left renal mass consistent with renal malignancy with renal vein invasion.  Focal annular thickening of descending colon.  05/01/2021 CT chest with contrast showed large sliding-type hiatal hernia.  Mild bilateral posterior bibasilar subsegmental atelectasis   06/25/2021 Initial Diagnosis   Cancer of kidney, left  06/25/2021, patient underwent radical nephrectomy by Dr. Diamantina Providence. Pathology showed clear-cell renal cell carcinoma, nuclear grade 2.  Benign adrenal tissue.  Lymphovascular invasion present. pT3a pNx    06/25/2021 Cancer Staging   Staging form: Kidney, AJCC 8th Edition - Clinical stage from 06/25/2021: Stage III (cT3a, cNX, cM0) - Signed by [name], MD on 08/06/2021 Stage prefix: Initial diagnosis   05/29/2022 Imaging   CT chest abdomen pelvis with contrast showed -1. New lytic lesions in the L2 vertebral body and intertrochanteric left femur are most consistent with osseous metastatic disease. 2. Prior left nephrectomy with unchanged size of the soft tissue and fluid collection in the nephrectomy bed favored to reflect postsurgical change given stability. Continued attention on follow-up imaging suggested. 3. Small lucent focus in the T11 vertebral body is nonspecific but stable from prior imaging suggestive of a benign etiology, attention on follow-up imaging  suggested. 4. Stable small bilateral pulmonary nodules, continued attention on follow-up imaging suggested. 5. Large hiatal hernia/intrathoracic stomach also containing a portion of the pancreatic body. 6. Prior prostatectomy without suspicious enhancing nodularity in the prostatectomy bed.   07/15/2022 Procedure   L2  vertebral body CT guided biopsy showed positive for malignancy, metastatic carcinoma, morphologically compatible with known clear cell renal cell carcinoma.    07/19/2022 -  Chemotherapy   RENAL CELL Pembrolizumab (200)       INTERVAL HISTORY Peter Stuart is a 76 y.o. male who has above history reviewed by me today presents for follow up visit for metastatic RCC  During the interval he has noticed left hip pain, 1-2 out of 10. he noticed it started after being on his hands and knees cleaning a stain on the carpet and got up when left hip starts to ache. Worse with prolonged walking. Better after resting.   He has used heat and ice compress, otc tylenol. Symptom has not much improved.  Review of Systems  Constitutional:  Negative for appetite change, chills, fever and unexpected weight change.  HENT:   Negative for hearing loss and voice change.   Eyes:  Negative for eye problems and icterus.  Respiratory:  Negative for chest tightness, cough and shortness of breath.   Cardiovascular:  Negative for chest pain and leg swelling.  Gastrointestinal:  Negative for abdominal distention and abdominal pain.  Endocrine: Negative for hot flashes.  Genitourinary:  Negative for difficulty urinating, dysuria and frequency.   Musculoskeletal:  Positive for back pain. Negative for arthralgias.       Left hip pain  Skin:  Negative for itching.  Neurological:  Negative for light-headedness and numbness.  Hematological:  Negative for adenopathy. Does not bruise/bleed easily.  Psychiatric/Behavioral:  Negative for confusion.     MEDICAL HISTORY:  Past Medical History:  Diagnosis Date   Anemia    Cancer ([place])    Complication of anesthesia    slow to wake after 1 surgery   CPAP use counseling    HTN (hypertension)    OSA on CPAP    Prostate cancer (HCC)    Seasonal allergies    Skin cancer     SURGICAL HISTORY: Past Surgical History:  Procedure Laterality Date   HERNIA REPAIR     IR FLUORO  GUIDED NEEDLE PLC ASPIRATION/INJECTION LOC  07/02/2022   LAPAROSCOPIC APPENDECTOMY N/A 04/27/2021   Procedure: APPENDECTOMY LAPAROSCOPIC;  Surgeon: Fredirick Maudlin, MD;  Location: [facility] ORS;  Service: General;  Laterality: N/A;   LAPAROSCOPIC NEPHRECTOMY, HAND ASSISTED Left 06/25/2021   Procedure: HAND ASSISTED LAPAROSCOPIC NEPHRECTOMY;  Surgeon: Billey Co, MD;  Location: [facility] ORS;  Service: Urology;  Laterality: Left;   prostatectomy     REPAIR KNEE LIGAMENT     SHOULDER ARTHROSCOPY WITH SUBACROMIAL DECOMPRESSION AND OPEN ROTATOR C Right 12/29/2020   Procedure: Right shoulder arthroscopic rotator cuff repair, subacromial decompression, and biceps tenodesis;  Surgeon: Leim Fabry, MD;  Location: [place];  Service: Orthopedics;  Laterality: Right;   TONSILLECTOMY     VASECTOMY      SOCIAL HISTORY: Social History   Socioeconomic History   Marital status: Widowed    Spouse name: Not on file   Number of children: Not on file   Years of education: Not on file   Highest education level: Not on file  Occupational History   Not on file  Tobacco Use   Smoking status: Never    Passive exposure: Never  Smokeless tobacco: Never  Vaping Use   Vaping Use: Never used  Substance and Sexual Activity   Alcohol use: Not Currently   Drug use: Never   Sexual activity: Not Currently  Other Topics Concern   Not on file  Social History Narrative   Not on file   Social Determinants of Health   Financial Resource Strain: Not on file  Food Insecurity: Not on file  Transportation Needs: Not on file  Physical Activity: Not on file  Stress: Not on file  Social Connections: Not on file  Intimate Partner Violence: Not on file    FAMILY HISTORY: Family History  Problem Relation Age of Onset   Stroke Mother    Hypertension Father    Emphysema Father     ALLERGIES:  has No Known Allergies.  MEDICATIONS:  Current Outpatient Medications  Medication Sig Dispense Refill    acetaminophen (TYLENOL) 500 MG tablet Take by mouth.     ALLEGRA ALLERGY 60 MG tablet Take 60 mg by mouth daily.     cholecalciferol (VITAMIN D3) 25 MCG (1000 UNIT) tablet Take 1,000 Units by mouth daily.     clotrimazole (LOTRIMIN) 1 % cream Apply topically 2 (two) times daily.     Coenzyme Q10 10 MG capsule Take 10 mg by mouth daily.     diphenhydrAMINE-Zinc Acetate (BENADRYL ITCH RELIEF EX) Apply topically.     ezetimibe (ZETIA) 10 MG tablet Take 1 tablet by mouth daily.     fluticasone (FLONASE) 50 MCG/ACT nasal spray Place into the nose.     gabapentin (NEURONTIN) 400 MG capsule Take 600 mg by mouth at bedtime.     GINKGO BILOBA COMPLEX PO Take by mouth.     hydrochlorothiazide (HYDRODIURIL) 25 MG tablet Take 1 tablet by mouth daily.     losartan (COZAAR) 100 MG tablet Take 100 mg by mouth daily.     Multiple Vitamin (MULTIVITAMIN) capsule Take 1 capsule by mouth daily.     multivitamin-lutein (OCUVITE-LUTEIN) CAPS capsule Take 1 capsule by mouth daily.     Omega-3 Fatty Acids (FISH OIL) 1000 MG CAPS Take 1 capsule by mouth 1 day or 1 dose.     Pembrolizumab (KEYTRUDA IV) Inject into the vein.     terbinafine (LAMISIL) 1 % cream Apply 1 application topically daily as needed (irritation).     No current facility-administered medications for this visit.     PHYSICAL EXAMINATION: ECOG PERFORMANCE STATUS: 0 - Asymptomatic Vitals:   09/02/22 1300  BP: 106/72  Pulse: 71  Resp: 16  Temp: 98 F (36.7 C)   Filed Weights   09/02/22 1300  Weight: 246 lb (111.6 kg)    Physical Exam Constitutional:      General: He is not in acute distress.    Appearance: He is obese.  HENT:     Head: Normocephalic and atraumatic.  Eyes:     General: No scleral icterus. Cardiovascular:     Rate and Rhythm: Normal rate.     Heart sounds: Normal heart sounds.  Pulmonary:     Effort: Pulmonary effort is normal. No respiratory distress.     Breath sounds: No wheezing.  Abdominal:     General:  Bowel sounds are normal. There is no distension.     Palpations: Abdomen is soft.  Musculoskeletal:        General: No deformity. Normal range of motion.     Cervical back: Normal range of motion and neck supple.  Skin:  General: Skin is warm and dry.     Findings: No erythema.  Neurological:     Mental Status: He is alert and oriented to person, place, and time. Mental status is at baseline.     Cranial Nerves: No cranial nerve deficit.     Coordination: Coordination normal.  Psychiatric:        Mood and Affect: Mood normal.     LABORATORY DATA:  I have reviewed the data as listed    Latest Ref Rng & Units 09/02/2022    1:33 PM 08/12/2022    9:02 AM 07/22/2022    9:04 AM  CBC  WBC 4.0 - 10.5 K/uL 6.8  7.4  6.0   Hemoglobin 13.0 - 17.0 g/dL 13.9  14.9  14.5   Hematocrit 39.0 - 52.0 % 40.4  43.0  41.8   Platelets 150 - 400 K/uL 187  212  201       Latest Ref Rng & Units 09/02/2022    1:33 PM 08/12/2022    9:02 AM 07/22/2022    9:04 AM  CMP  Glucose 70 - 99 mg/dL 230  113  117   BUN 8 - 23 mg/dL 36  35  35   Creatinine 0.61 - 1.24 mg/dL 1.73  1.87  1.82   Sodium 135 - 145 mmol/L 132  136  137   Potassium 3.5 - 5.1 mmol/L 3.8  4.1  3.6   Chloride 98 - 111 mmol/L 98  102  105   CO2 22 - 32 mmol/L '24  27  27   '$ Calcium 8.9 - 10.3 mg/dL 8.7  9.8  8.8   Total Protein 6.5 - 8.1 g/dL 6.6  6.9  6.6   Total Bilirubin 0.3 - 1.2 mg/dL 0.4  0.6  0.6   Alkaline Phos 38 - 126 U/L 47  52  52   AST 15 - 41 U/L '31  24  17   '$ ALT 0 - 44 U/L 54  31  17     RADIOGRAPHIC STUDIES: I have personally reviewed the radiological images as listed and agreed with the findings in the report. No results found.

## 2022-09-02 NOTE — Assessment & Plan Note (Addendum)
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 Biopsy of L2 + metastatic carcinoma, stage IV, limited disease burden.  Currently on palliative immunotherapy Labs are reviewed and discussed with patient. Proceed with Keytruda.   

## 2022-09-02 NOTE — Assessment & Plan Note (Signed)
Treatment plan listed above.  Monitor immunotherapy side effects 

## 2022-09-05 ENCOUNTER — Telehealth: Payer: Self-pay

## 2022-09-05 ENCOUNTER — Encounter: Payer: Self-pay | Admitting: Oncology

## 2022-09-05 NOTE — Telephone Encounter (Signed)
Per message from [place], pt called wanting xray results.   Per report: no fracture or dislocation.   Dr. Tasia Catchings recommends left hip MRI w wo if pain is not better.   Called pt and informed him of resutls and recommendations. He states that at the moment he would like to hold off on MRI since pain is a little better. He will call us back if pain worsens or he decided to proceed with MRI.

## 2022-09-23 ENCOUNTER — Inpatient Hospital Stay: Payer: Medicare Other

## 2022-09-23 ENCOUNTER — Encounter: Payer: Self-pay | Admitting: Oncology

## 2022-09-23 ENCOUNTER — Ambulatory Visit: Payer: Medicare Other | Admitting: Oncology

## 2022-09-23 ENCOUNTER — Ambulatory Visit: Payer: Medicare Other

## 2022-09-23 ENCOUNTER — Other Ambulatory Visit: Payer: Medicare Other

## 2022-09-23 ENCOUNTER — Inpatient Hospital Stay (HOSPITAL_BASED_OUTPATIENT_CLINIC_OR_DEPARTMENT_OTHER): Payer: Medicare Other | Admitting: Oncology

## 2022-09-23 VITALS — BP 128/81 | HR 67 | Temp 98.6°F | Wt 246.9 lb

## 2022-09-23 DIAGNOSIS — C642 Malignant neoplasm of left kidney, except renal pelvis: Secondary | ICD-10-CM

## 2022-09-23 DIAGNOSIS — M25552 Pain in left hip: Secondary | ICD-10-CM

## 2022-09-23 DIAGNOSIS — Z5112 Encounter for antineoplastic immunotherapy: Secondary | ICD-10-CM

## 2022-09-23 DIAGNOSIS — N1832 Chronic kidney disease, stage 3b: Secondary | ICD-10-CM | POA: Diagnosis not present

## 2022-09-23 DIAGNOSIS — C7951 Secondary malignant neoplasm of bone: Secondary | ICD-10-CM | POA: Insufficient documentation

## 2022-09-23 DIAGNOSIS — R21 Rash and other nonspecific skin eruption: Secondary | ICD-10-CM

## 2022-09-23 LAB — COMPREHENSIVE METABOLIC PANEL
ALT: 40 U/L (ref 0–44)
AST: 23 U/L (ref 15–41)
Albumin: 4.2 g/dL (ref 3.5–5.0)
Alkaline Phosphatase: 44 U/L (ref 38–126)
Anion gap: 8 (ref 5–15)
BUN: 35 mg/dL — ABNORMAL HIGH (ref 8–23)
CO2: 26 mmol/L (ref 22–32)
Calcium: 9.1 mg/dL (ref 8.9–10.3)
Chloride: 101 mmol/L (ref 98–111)
Creatinine, Ser: 1.88 mg/dL — ABNORMAL HIGH (ref 0.61–1.24)
GFR, Estimated: 36 mL/min — ABNORMAL LOW (ref >60)
Glucose, Bld: 108 mg/dL — ABNORMAL HIGH (ref 70–99)
Potassium: 4.1 mmol/L (ref 3.5–5.1)
Sodium: 135 mmol/L (ref 135–145)
Total Bilirubin: 0.4 mg/dL (ref 0.3–1.2)
Total Protein: 7.1 g/dL (ref 6.5–8.1)

## 2022-09-23 LAB — CBC WITH DIFFERENTIAL/PLATELET
Abs Immature Granulocytes: 0.04 10*3/uL (ref 0.00–0.07)
Basophils Absolute: 0.1 10*3/uL (ref 0.0–0.1)
Basophils Relative: 1 %
Eosinophils Absolute: 0.6 10*3/uL — ABNORMAL HIGH (ref 0.0–0.5)
Eosinophils Relative: 8 %
HCT: 42.1 % (ref 39.0–52.0)
Hemoglobin: 14.4 g/dL (ref 13.0–17.0)
Immature Granulocytes: 1 %
Lymphocytes Relative: 20 %
Lymphs Abs: 1.4 10*3/uL (ref 0.7–4.0)
MCH: 30.6 pg (ref 26.0–34.0)
MCHC: 34.2 g/dL (ref 30.0–36.0)
MCV: 89.6 fL (ref 80.0–100.0)
Monocytes Absolute: 0.9 10*3/uL (ref 0.1–1.0)
Monocytes Relative: 12 %
Neutro Abs: 4.2 10*3/uL (ref 1.7–7.7)
Neutrophils Relative %: 58 %
Platelets: 211 10*3/uL (ref 150–400)
RBC: 4.7 MIL/uL (ref 4.22–5.81)
RDW: 13 % (ref 11.5–15.5)
WBC: 7.2 10*3/uL (ref 4.0–10.5)
nRBC: 0 % (ref 0.0–0.2)

## 2022-09-23 MED ORDER — SODIUM CHLORIDE 0.9 % IV SOLN
200.0000 mg | Freq: Once | INTRAVENOUS | Status: AC
  Administered 2022-09-23: 200 mg via INTRAVENOUS
  Filled 2022-09-23: qty 200

## 2022-09-23 MED ORDER — SODIUM CHLORIDE 0.9 % IV SOLN
Freq: Once | INTRAVENOUS | Status: AC
  Filled 2022-09-23: qty 250

## 2022-09-23 NOTE — Assessment & Plan Note (Signed)
Treatment plan listed above.  Monitor immunotherapy side effects 

## 2022-09-23 NOTE — Progress Notes (Signed)
Patient is here for follow-up and treatment. He states that he is having pain in his left leg. He states that last time he was here he had pain in his left hip and Dr. Tasia Catchings had him do x-ray which revealed no fracture. He rates his pain a 4/10 today.

## 2022-09-23 NOTE — Assessment & Plan Note (Signed)
Recommend topical benadryl cream PRN.

## 2022-09-23 NOTE — Assessment & Plan Note (Signed)
negative left hip xray.  He has left femur lesion on previous CT obtain MRI for further evaluation.  Refer to Radonc

## 2022-09-23 NOTE — Assessment & Plan Note (Signed)
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 Biopsy of L2 + metastatic carcinoma, stage IV, limited disease burden.  Currently on palliative immunotherapy Labs are reviewed and discussed with patient. Proceed with Keytruda.   

## 2022-09-23 NOTE — Patient Instructions (Signed)
[facility]  Discharge Instructions: Thank you for choosing [facility] to provide your oncology and hematology care.  If you have a lab appointment with the Cancer Center, please go directly to the Cancer Center and check in at the registration area.  Wear comfortable clothing and clothing appropriate for easy access to any Portacath or PICC line.   We strive to give you quality time with your provider. You may need to reschedule your appointment if you arrive late (15 or more minutes).  Arriving late affects you and other patients whose appointments are after yours.  Also, if you miss three or more appointments without notifying the office, you may be dismissed from the clinic at the provider's discretion.      For prescription refill requests, have your pharmacy contact our office and allow 72 hours for refills to be completed.    Today you received the following chemotherapy and/or immunotherapy agents Keytruda      To help prevent nausea and vomiting after your treatment, we encourage you to take your nausea medication as directed.  BELOW ARE SYMPTOMS THAT SHOULD BE REPORTED IMMEDIATELY: *FEVER GREATER THAN 100.4 F (38 C) OR HIGHER *CHILLS OR SWEATING *NAUSEA AND VOMITING THAT IS NOT CONTROLLED WITH YOUR NAUSEA MEDICATION *UNUSUAL SHORTNESS OF BREATH *UNUSUAL BRUISING OR BLEEDING *URINARY PROBLEMS (pain or burning when urinating, or frequent urination) *BOWEL PROBLEMS (unusual diarrhea, constipation, pain near the anus) TENDERNESS IN MOUTH AND THROAT WITH OR WITHOUT PRESENCE OF ULCERS (sore throat, sores in mouth, or a toothache) UNUSUAL RASH, SWELLING OR PAIN  UNUSUAL VAGINAL DISCHARGE OR ITCHING   Items with * indicate a potential emergency and should be followed up as soon as possible or go to the Emergency Department if any problems should occur.  Please show the CHEMOTHERAPY ALERT CARD or IMMUNOTHERAPY ALERT CARD at check-in to  the Emergency Department and triage nurse.  Should you have questions after your visit or need to cancel or reschedule your appointment, please contact [facility]  [phone] and follow the prompts.  Office hours are 8:00 a.m. to 4:30 p.m. Monday - Friday. Please note that voicemails left after 4:00 p.m. may not be returned until the following business day.  We are closed weekends and major holidays. You have access to a nurse at all times for urgent questions. Please call the main number to the clinic [phone] and follow the prompts.  For any non-urgent questions, you may also contact your provider using MyChart. We now offer e-Visits for anyone 18 and older to request care online for non-urgent symptoms. For details visit [email or web address].   Also download the MyChart app! Go to the app store, search "MyChart", open the app, select [facility], and log in with your MyChart username and password.  Masks are optional in the cancer centers. If you would like for your care team to wear a mask while they are taking care of you, please let them know. For doctor visits, patients may have with them one support person who is at least 78 years old. At this time, visitors are not allowed in the infusion area.   

## 2022-09-23 NOTE — Assessment & Plan Note (Signed)
Encourage oral hydration, avoid nephrotoxins 

## 2022-09-23 NOTE — Progress Notes (Signed)
Hematology/Oncology Progress note [phone] [phone]      Patient Care Team: Dion Body, MD as PCP - General (Family Medicine)  ASSESSMENT & PLAN:   Cancer Staging  Cancer of kidney, left [facility]) Staging form: Kidney, AJCC 8th Edition - Clinical stage from 06/25/2021: Stage III (cT3a, cNX, cM0) - Signed by [name], MD on 08/06/2021   Cancer of kidney, left [facility]) History of stage III left RCC, Lytic bone lesion of L2 and intertrochanteric left femur, consistent with osseous metastasis. Biopsy of L2 + metastatic carcinoma, stage IV, limited disease burden.  Currently on palliative immunotherapy Labs are reviewed and discussed with patient. Proceed with Keytruda.    CKD (chronic kidney disease) stage 3, GFR 30-59 ml/min (HCC) Encourage oral hydration, avoid nephrotoxins  Encounter for antineoplastic immunotherapy Treatment plan listed above.  Monitor immunotherapy side effects  Left hip pain negative left hip xray.  He has left femur lesion on previous CT obtain MRI for further evaluation.  Refer to Radonc  Skin rash Recommend topical benadryl cream PRN.    Orders Placed This Encounter  Procedures   MR HIP LEFT W WO CONTRAST    Standing Status:   Future    Standing Expiration Date:   09/24/2023    Order Specific Question:   If indicated for the ordered procedure, I authorize the administration of contrast media per Radiology protocol    Answer:   Yes    Order Specific Question:   What is the patient's sedation requirement?    Answer:   No Sedation    Order Specific Question:   Does the patient have a pacemaker or implanted devices?    Answer:   No    Order Specific Question:   Preferred imaging location?    Answer:   [facility] (table limit - 550lbs)   CBC with Differential    Standing Status:   Future    Standing Expiration Date:   10/15/2023   Comprehensive metabolic panel    Standing Status:   Future    Standing  Expiration Date:   10/15/2023   T4, free    Standing Status:   Future    Standing Expiration Date:   11/05/2023   CBC with Differential    Standing Status:   Future    Standing Expiration Date:   11/05/2023   Comprehensive metabolic panel    Standing Status:   Future    Standing Expiration Date:   11/05/2023   TSH    Standing Status:   Future    Standing Expiration Date:   11/05/2023   Ambulatory referral to Radiation Oncology    Referral Priority:   Routine    Referral Type:   Consultation    Referral Reason:   Specialty Services Required    Requested Specialty:   Radiation Oncology    Number of Visits Requested:   1   Follow up 3 weeks lab MD [facility] All questions were answered. The patient knows to call the clinic with any problems, questions or concerns.  [name], MD, PhD [facility] Hematology Oncology 09/23/2022   CHIEF COMPLAINTS/REASON FOR VISIT:  RCC  HISTORY OF PRESENTING ILLNESS:   Peter Stuart is a  76 y.o.  male presents for follow-up of history of left RCC and history of prostate cancer. Oncology history summary listed as below. Oncology History  Cancer of kidney, left [facility])  2009 Initial Diagnosis   History of prostate cancer treated with robotic prostatectomy initial  04/27/2021 Imaging   CT abdomen pelvis showed acute appendicitis without evidence of perforation or abscess.  Large left renal mass consistent with renal malignancy with renal vein invasion.  Focal annular thickening of descending colon.  05/01/2021 CT chest with contrast showed large sliding-type hiatal hernia.  Mild bilateral posterior bibasilar subsegmental atelectasis   06/25/2021 Initial Diagnosis   Cancer of kidney, left  06/25/2021, patient underwent radical nephrectomy by Dr. Diamantina Providence. Pathology showed clear-cell renal cell carcinoma, nuclear grade 2.  Benign adrenal tissue.  Lymphovascular invasion present. pT3a pNx    06/25/2021 Cancer Staging   Staging form: Kidney, AJCC 8th Edition -  Clinical stage from 06/25/2021: Stage III (cT3a, cNX, cM0) - Signed by [name], MD on 08/06/2021 Stage prefix: Initial diagnosis   05/29/2022 Imaging   CT chest abdomen pelvis with contrast showed -1. New lytic lesions in the L2 vertebral body and intertrochanteric left femur are most consistent with osseous metastatic disease. 2. Prior left nephrectomy with unchanged size of the soft tissue and fluid collection in the nephrectomy bed favored to reflect postsurgical change given stability. Continued attention on follow-up imaging suggested. 3. Small lucent focus in the T11 vertebral body is nonspecific but stable from prior imaging suggestive of a benign etiology, attention on follow-up imaging suggested. 4. Stable small bilateral pulmonary nodules, continued attention on follow-up imaging suggested. 5. Large hiatal hernia/intrathoracic stomach also containing a portion of the pancreatic body. 6. Prior prostatectomy without suspicious enhancing nodularity in the prostatectomy bed.   07/15/2022 Procedure   L2 vertebral body CT guided biopsy showed positive for malignancy, metastatic carcinoma, morphologically compatible with known clear cell renal cell carcinoma.    07/19/2022 -  Chemotherapy   RENAL CELL Pembrolizumab (200)       INTERVAL HISTORY Jud Fanguy is a 76 y.o. male who has above history reviewed by me today presents for follow up visit for metastatic RCC  During the interval he has noticed left hip pain, pain has been lower in is left thigh. Worse with prolonged walking. Better after resting.   He has used heat and ice compress, otc tylenol.    Review of Systems  Constitutional:  Negative for appetite change, chills, fever and unexpected weight change.  HENT:   Negative for hearing loss and voice change.   Eyes:  Negative for eye problems and icterus.  Respiratory:  Negative for chest tightness, cough and shortness of breath.   Cardiovascular:  Negative for chest pain and leg  swelling.  Gastrointestinal:  Negative for abdominal distention and abdominal pain.  Endocrine: Negative for hot flashes.  Genitourinary:  Negative for difficulty urinating, dysuria and frequency.   Musculoskeletal:  Positive for back pain. Negative for arthralgias.       Left hip pain  Skin:  Negative for itching.  Neurological:  Negative for light-headedness and numbness.  Hematological:  Negative for adenopathy. Does not bruise/bleed easily.  Psychiatric/Behavioral:  Negative for confusion.     MEDICAL HISTORY:  Past Medical History:  Diagnosis Date   Anemia    Cancer ([place])    Complication of anesthesia    slow to wake after 1 surgery   CPAP use counseling    HTN (hypertension)    OSA on CPAP    Prostate cancer (HCC)    Seasonal allergies    Skin cancer     SURGICAL HISTORY: Past Surgical History:  Procedure Laterality Date   HERNIA REPAIR     IR FLUORO GUIDED NEEDLE PLC ASPIRATION/INJECTION LOC  07/02/2022   LAPAROSCOPIC APPENDECTOMY N/A 04/27/2021   Procedure: APPENDECTOMY LAPAROSCOPIC;  Surgeon: Fredirick Maudlin, MD;  Location: [facility] ORS;  Service: General;  Laterality: N/A;   LAPAROSCOPIC NEPHRECTOMY, HAND ASSISTED Left 06/25/2021   Procedure: HAND ASSISTED LAPAROSCOPIC NEPHRECTOMY;  Surgeon: Billey Co, MD;  Location: [facility] ORS;  Service: Urology;  Laterality: Left;   prostatectomy     REPAIR KNEE LIGAMENT     SHOULDER ARTHROSCOPY WITH SUBACROMIAL DECOMPRESSION AND OPEN ROTATOR C Right 12/29/2020   Procedure: Right shoulder arthroscopic rotator cuff repair, subacromial decompression, and biceps tenodesis;  Surgeon: Leim Fabry, MD;  Location: [facility];  Service: Orthopedics;  Laterality: Right;   TONSILLECTOMY     VASECTOMY      SOCIAL HISTORY: Social History   Socioeconomic History   Marital status: Widowed    Spouse name: Not on file   Number of children: Not on file   Years of education: Not on file   Highest education level: Not on file   Occupational History   Not on file  Tobacco Use   Smoking status: Never    Passive exposure: Never   Smokeless tobacco: Never  Vaping Use   Vaping Use: Never used  Substance and Sexual Activity   Alcohol use: Not Currently   Drug use: Never   Sexual activity: Not Currently  Other Topics Concern   Not on file  Social History Narrative   Not on file   Social Determinants of Health   Financial Resource Strain: Not on file  Food Insecurity: Not on file  Transportation Needs: Not on file  Physical Activity: Not on file  Stress: Not on file  Social Connections: Not on file  Intimate Partner Violence: Not on file    FAMILY HISTORY: Family History  Problem Relation Age of Onset   Stroke Mother    Hypertension Father    Emphysema Father     ALLERGIES:  has No Known Allergies.  MEDICATIONS:  Current Outpatient Medications  Medication Sig Dispense Refill   acetaminophen (TYLENOL) 500 MG tablet Take by mouth.     ALLEGRA ALLERGY 60 MG tablet Take 60 mg by mouth daily.     cholecalciferol (VITAMIN D3) 25 MCG (1000 UNIT) tablet Take 1,000 Units by mouth daily.     clotrimazole (LOTRIMIN) 1 % cream Apply topically 2 (two) times daily.     Coenzyme Q10 10 MG capsule Take 10 mg by mouth daily.     diphenhydrAMINE-Zinc Acetate (BENADRYL ITCH RELIEF EX) Apply topically.     ezetimibe (ZETIA) 10 MG tablet Take 1 tablet by mouth daily.     fluticasone (FLONASE) 50 MCG/ACT nasal spray Place into the nose.     gabapentin (NEURONTIN) 400 MG capsule Take 600 mg by mouth at bedtime.     GINKGO BILOBA COMPLEX PO Take by mouth.     hydrochlorothiazide (HYDRODIURIL) 25 MG tablet Take 1 tablet by mouth daily.     losartan (COZAAR) 100 MG tablet Take 100 mg by mouth daily.     Multiple Vitamin (MULTIVITAMIN) capsule Take 1 capsule by mouth daily.     multivitamin-lutein (OCUVITE-LUTEIN) CAPS capsule Take 1 capsule by mouth daily.     Omega-3 Fatty Acids (FISH OIL) 1000 MG CAPS Take 1  capsule by mouth 1 day or 1 dose.     Pembrolizumab (KEYTRUDA IV) Inject into the vein.     terbinafine (LAMISIL) 1 % cream Apply 1 application topically daily as needed (irritation).  No current facility-administered medications for this visit.     PHYSICAL EXAMINATION: ECOG PERFORMANCE STATUS: 1 - Symptomatic but completely ambulatory Vitals:   09/23/22 1416  BP: 128/81  Pulse: 67  Temp: 98.6 F (37 C)  SpO2: 99%   Filed Weights   09/23/22 1416  Weight: 246 lb 14.4 oz (112 kg)    Physical Exam Constitutional:      General: He is not in acute distress.    Appearance: He is obese.  HENT:     Head: Normocephalic and atraumatic.  Eyes:     General: No scleral icterus. Cardiovascular:     Rate and Rhythm: Normal rate.     Heart sounds: Normal heart sounds.  Pulmonary:     Effort: Pulmonary effort is normal. No respiratory distress.     Breath sounds: No wheezing.  Abdominal:     General: Bowel sounds are normal. There is no distension.     Palpations: Abdomen is soft.  Musculoskeletal:        General: No deformity. Normal range of motion.     Cervical back: Normal range of motion and neck supple.  Skin:    General: Skin is warm and dry.     Findings: Rash present. No erythema.     Comments: Scattered rash on his shoulder, neck and back  Neurological:     Mental Status: He is alert and oriented to person, place, and time. Mental status is at baseline.     Cranial Nerves: No cranial nerve deficit.     Coordination: Coordination normal.  Psychiatric:        Mood and Affect: Mood normal.     LABORATORY DATA:  I have reviewed the data as listed    Latest Ref Rng & Units 09/23/2022    2:02 PM 09/02/2022    1:33 PM 08/12/2022    9:02 AM  CBC  WBC 4.0 - 10.5 K/uL 7.2  6.8  7.4   Hemoglobin 13.0 - 17.0 g/dL 14.4  13.9  14.9   Hematocrit 39.0 - 52.0 % 42.1  40.4  43.0   Platelets 150 - 400 K/uL 211  187  212       Latest Ref Rng & Units 09/23/2022    2:02  PM 09/02/2022    1:33 PM 08/12/2022    9:02 AM  CMP  Glucose 70 - 99 mg/dL 108  230  113   BUN 8 - 23 mg/dL 35  36  35   Creatinine 0.61 - 1.24 mg/dL 1.88  1.73  1.87   Sodium 135 - 145 mmol/L 135  132  136   Potassium 3.5 - 5.1 mmol/L 4.1  3.8  4.1   Chloride 98 - 111 mmol/L 101  98  102   CO2 22 - 32 mmol/L '26  24  27   '$ Calcium 8.9 - 10.3 mg/dL 9.1  8.7  9.8   Total Protein 6.5 - 8.1 g/dL 7.1  6.6  6.9   Total Bilirubin 0.3 - 1.2 mg/dL 0.4  0.4  0.6   Alkaline Phos 38 - 126 U/L 44  47  52   AST 15 - 41 U/L '23  31  24   '$ ALT 0 - 44 U/L 40  54  31     RADIOGRAPHIC STUDIES: I have personally reviewed the radiological images as listed and agreed with the findings in the report. DG HIP UNILAT W OR W/O PELVIS 2-3 VIEWS LEFT  Result Date: 09/05/2022 CLINICAL  DATA:  Left hip pain since last Thursday EXAM: DG HIP (WITH OR WITHOUT PELVIS) 2-3V LEFT COMPARISON:  None Available. FINDINGS: There is no evidence of hip fracture or dislocation. There is no evidence of arthropathy or other focal bone abnormality. IMPRESSION: Negative. Electronically Signed   By: Jorje Guild M.D.   On: 09/05/2022 13:07

## 2022-10-01 ENCOUNTER — Ambulatory Visit
Admission: RE | Admit: 2022-10-01 | Discharge: 2022-10-01 | Disposition: A | Discharge status: Home / Self Care | Payer: Medicare Other | Source: Ambulatory Visit | Attending: Oncology | Admitting: Oncology

## 2022-10-01 DIAGNOSIS — C642 Malignant neoplasm of left kidney, except renal pelvis: Secondary | ICD-10-CM | POA: Insufficient documentation

## 2022-10-01 MED ORDER — GADOBUTROL 1 MMOL/ML IV SOLN
10.0000 mL | Freq: Once | INTRAVENOUS | Status: AC | PRN
  Administered 2022-10-01: 10 mL via INTRAVENOUS

## 2022-10-05 IMAGING — MR MR SHOULDER*R* W/O CM
5 series · 40 of 40 positions shown · non-contrast
Comparison: None.

CLINICAL DATA: Shoulder pain for several months.

EXAM:
MRI OF THE RIGHT SHOULDER WITHOUT CONTRAST
TECHNIQUE: Multiplanar, multisequence MR imaging of the shoulder was performed.
No intravenous contrast was administered.

[Series 3: T2 fat-sat · axial · 4.0mm · 0.59mm/px · z∈[-8,+88]mm · 8 of 23 slices shown (1 of 3)]
[im 1/23]
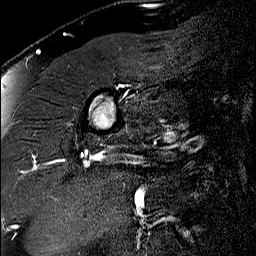
[im 4/23]
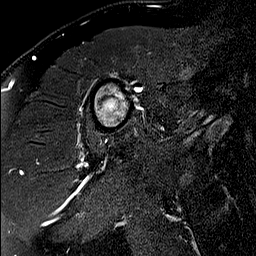
[im 7/23]
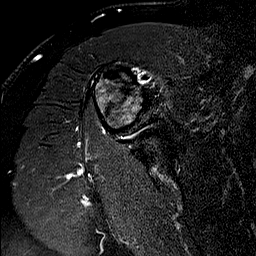
[im 10/23]
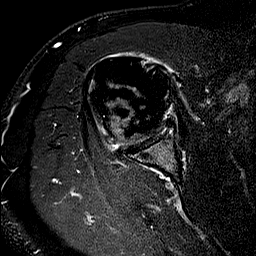
[im 13/23]
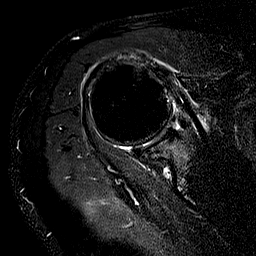
[im 16/23]
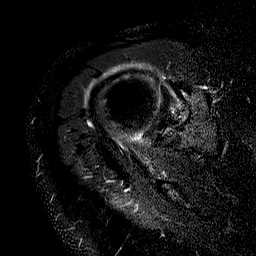
[im 19/23]
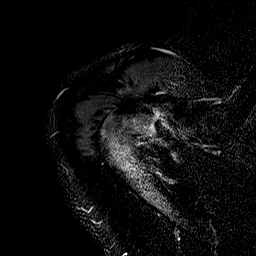
[im 23/23]
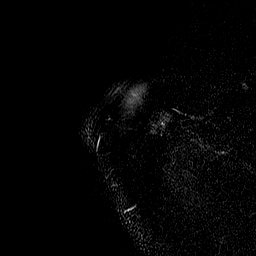

[Series 4: T2 fat-sat · oblique · 4.0mm · 0.59mm/px · 7 of 20 slices shown (2 of 3)]
[im 1/20]
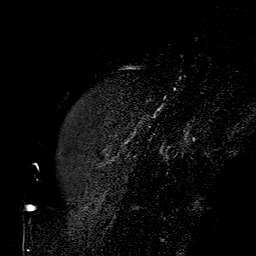
[im 4/20]
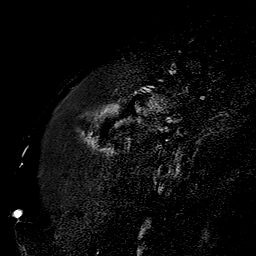
[im 7/20]
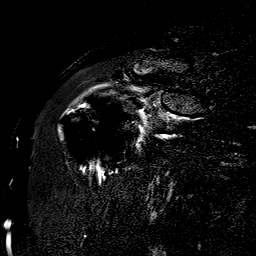
[im 10/20]
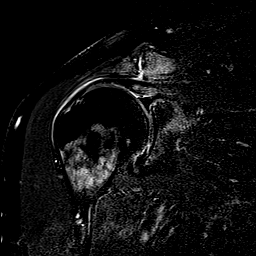
[im 13/20]
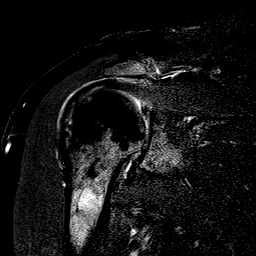
[im 16/20]
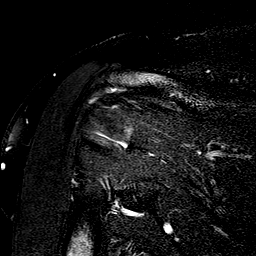
[im 20/20]
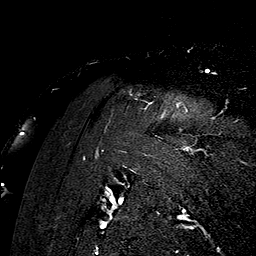

[Series 5: PD · oblique · 4.0mm · 0.59mm/px · 7 of 20 slices shown]
[im 1/20]
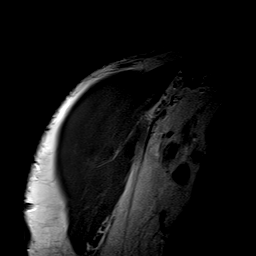
[im 4/20]
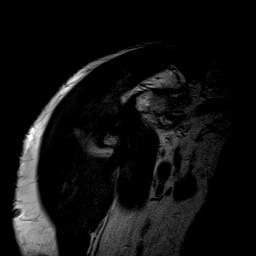
[im 7/20]
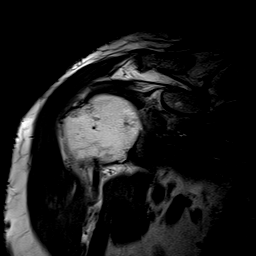
[im 10/20]
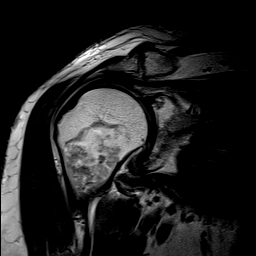
[im 13/20]
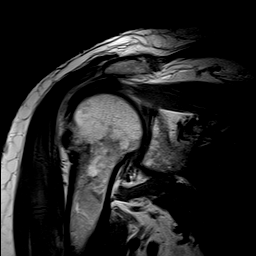
[im 16/20]
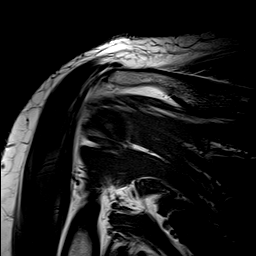
[im 20/20]
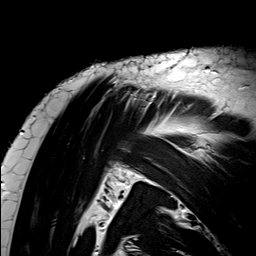

[Series 6: T1 · oblique · 4.0mm · 0.59mm/px · 9 of 24 slices shown]
[im 1/24]
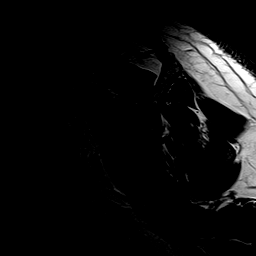
[im 3/24]
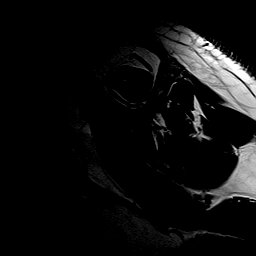
[im 6/24]
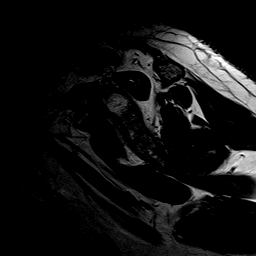
[im 9/24]
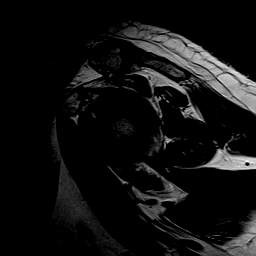
[im 12/24]
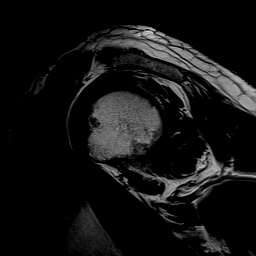
[im 15/24]
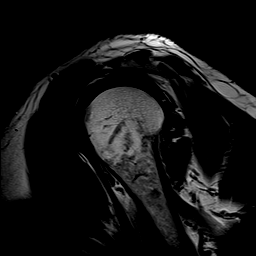
[im 18/24]
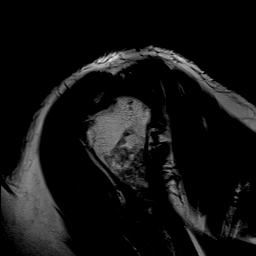
[im 21/24]
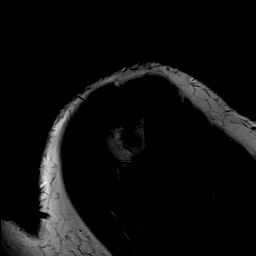
[im 24/24]
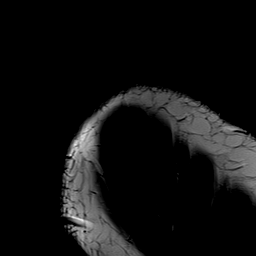

[Series 7: T2 fat-sat · oblique · 4.0mm · 0.59mm/px · 9 of 24 slices shown (3 of 3)]
[im 1/24]
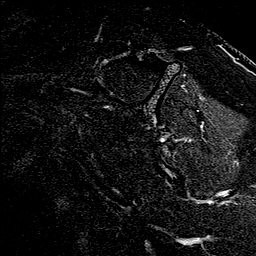
[im 3/24]
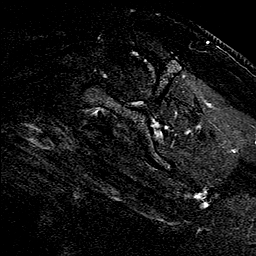
[im 6/24]
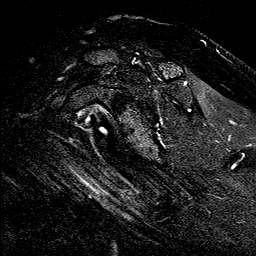
[im 9/24]
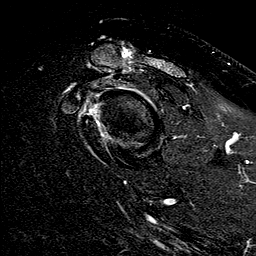
[im 12/24]
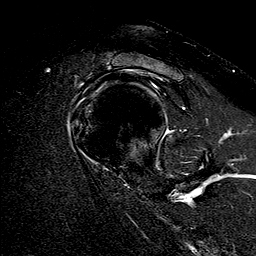
[im 15/24]
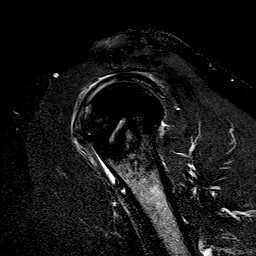
[im 18/24]
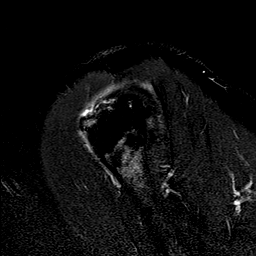
[im 21/24]
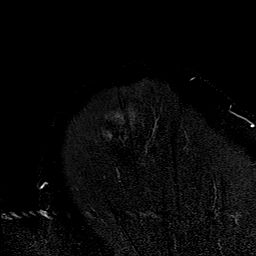
[im 24/24]
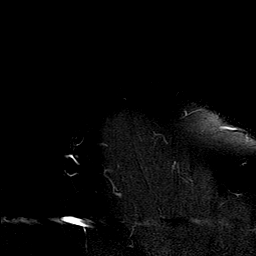

[40 of 40 positions shown; findings below may reference images not displayed]

FINDINGS: Rotator cuff: Significant rotator cuff tendinopathy/tendinosis.
There is fairly marked thinning of the supraspinatus tendon and
partial-thickness articular surface tearing anteriorly. The tear is
fairly deep and maximum laminar retraction of the articular fibers
is 14 mm. No discrete full-thickness retracted tear but probably at
risk for such.

Muscles:  No significant findings.

Biceps long head:  Intact

Acromioclavicular Joint: Moderate degenerative changes. Type 2
acromion. No lateral downsloping or subacromial spurring.

Glenohumeral Joint: Moderate degenerative changes with degenerative
chondrosis, early joint space narrowing and small joint effusion.

Labrum:  Labral degenerative changes without obvious tear.

Bones:  No acute bony findings.

Other: Mild subacromial/subdeltoid bursitis.
IMPRESSION: 1. Significant rotator cuff tendinopathy/tendinosis. There is fairly
marked thinning of the supraspinatus tendon and partial-thickness
articular surface tearing anteriorly. No discrete full-thickness
retracted tear but probably at risk for such.
2. Intact long head biceps tendon.
3. Moderate AC joint and glenohumeral joint degenerative changes.
4. Mild subacromial/subdeltoid bursitis.

## 2022-10-07 ENCOUNTER — Telehealth: Payer: Self-pay

## 2022-10-07 NOTE — Telephone Encounter (Signed)
-----   Message from [name], MD sent at 10/06/2022 10:17 PM EST ----- Please let him know that his left thigh bone lesion has increased in size, this may contribute to his hip pain. Recommend him to see Radonc. Please refer to Dr.chrystal.

## 2022-10-07 NOTE — Telephone Encounter (Signed)
Mychart message sent to pt. Pt scheduled to see Dr. Baruch Gouty on Wed 12/16.

## 2022-10-09 ENCOUNTER — Ambulatory Visit
Admission: RE | Admit: 2022-10-09 | Discharge: 2022-10-09 | Disposition: A | Discharge status: Home / Self Care | Payer: Medicare Other | Source: Ambulatory Visit | Attending: Radiation Oncology | Admitting: Radiation Oncology

## 2022-10-09 ENCOUNTER — Encounter: Payer: Self-pay | Admitting: Radiation Oncology

## 2022-10-09 VITALS — BP 137/82 | HR 66 | Resp 14 | Ht 69.0 in | Wt 246.0 lb

## 2022-10-09 DIAGNOSIS — Z5112 Encounter for antineoplastic immunotherapy: Secondary | ICD-10-CM | POA: Insufficient documentation

## 2022-10-09 DIAGNOSIS — Z79899 Other long term (current) drug therapy: Secondary | ICD-10-CM | POA: Insufficient documentation

## 2022-10-09 DIAGNOSIS — C642 Malignant neoplasm of left kidney, except renal pelvis: Secondary | ICD-10-CM | POA: Insufficient documentation

## 2022-10-09 DIAGNOSIS — C7951 Secondary malignant neoplasm of bone: Secondary | ICD-10-CM | POA: Insufficient documentation

## 2022-10-09 NOTE — Consult Note (Signed)
NEW PATIENT EVALUATION  Name: Peter Stuart  MRN: [id number]  Date:   10/09/2022     DOB: [birth year]   This 76 y.o. male patient presents to the clinic for initial evaluation of evaluation of metastatic disease to his L-spine as well as his left femoral shaft and patient with known stage IV renal cell carcinoma.  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   PREVIOUS INVESTIGATIONS:  MRI scans CT scans reviewed Pathology report reviewed Clinical notes reviewed  HPI: Patient is a 76 year old male diagnosed in 54 with a stage III (cT3a cNX M0) renal cell carcinoma.  He had a nephrectomy of his left kidney.  He has documented lytic bone lesions of L2 and recently diagnosed with intertrochanteric left femoral metastasis.  Biopsy of his L2 lesion was positive for metastatic carcinoma.  He is currently on palliative immunotherapy.  He has been having increasing left hip pain which is decreasing his ability to ambulate although he is still ambulating and driving a car.  He also has a small lesion of the right posterior ischium.  He is seen today for consideration of palliative radiation therapy.  He continues on Keytruda at this time although that may change based on progressive of his disease. PLANNED TREATMENT REGIMEN: SBRT to L-spine as well as left femur  PAST MEDICAL HISTORY:  has a past medical history of Anemia, Cancer ([place]), Complication of anesthesia, CPAP use counseling, HTN (hypertension), OSA on CPAP, Prostate cancer ([place]), Seasonal allergies, and Skin cancer.    PAST SURGICAL HISTORY:  Past Surgical History:  Procedure Laterality Date   HERNIA REPAIR     IR FLUORO GUIDED NEEDLE PLC ASPIRATION/INJECTION LOC  07/02/2022   LAPAROSCOPIC APPENDECTOMY N/A 04/27/2021   Procedure: APPENDECTOMY LAPAROSCOPIC;  Surgeon:  Fredirick Maudlin, MD;  Location: [facility] ORS;  Service: General;  Laterality: N/A;   LAPAROSCOPIC NEPHRECTOMY, HAND ASSISTED Left 06/25/2021   Procedure: HAND ASSISTED LAPAROSCOPIC NEPHRECTOMY;  Surgeon: Billey Co, MD;  Location: [facility] ORS;  Service: Urology;  Laterality: Left;   prostatectomy     REPAIR KNEE LIGAMENT     SHOULDER ARTHROSCOPY WITH SUBACROMIAL DECOMPRESSION AND OPEN ROTATOR C Right 12/29/2020   Procedure: Right shoulder arthroscopic rotator cuff repair, subacromial decompression, and biceps tenodesis;  Surgeon: Leim Fabry, MD;  Location: [facility];  Service: Orthopedics;  Laterality: Right;   TONSILLECTOMY     VASECTOMY      FAMILY HISTORY: family history includes Emphysema in his father; Hypertension in his father; Stroke in his mother.  SOCIAL HISTORY:  reports that he has never smoked. He has never been exposed to tobacco smoke. He has never used smokeless tobacco. He reports that he does not currently use alcohol. He reports that he does not use drugs.  ALLERGIES: Patient has no known allergies.  MEDICATIONS:  Current Outpatient Medications  Medication Sig Dispense Refill   acetaminophen (TYLENOL) 500 MG tablet Take by mouth.     ALLEGRA ALLERGY 60 MG tablet Take 60 mg by mouth daily.     cholecalciferol (VITAMIN D3) 25 MCG (1000 UNIT) tablet Take 1,000 Units by mouth daily.     clotrimazole (LOTRIMIN) 1 % cream Apply topically 2 (two) times daily.     Coenzyme Q10 10 MG capsule Take 10 mg by mouth daily.     diphenhydrAMINE-Zinc Acetate [facility]  RELIEF EX) Apply topically.     ezetimibe (ZETIA) 10 MG tablet Take 1 tablet by mouth daily.     fluticasone (FLONASE) 50 MCG/ACT nasal spray Place into the nose.     gabapentin (NEURONTIN) 400 MG capsule Take 600 mg by mouth at bedtime.     GINKGO BILOBA COMPLEX PO Take by mouth.     hydrochlorothiazide (HYDRODIURIL) 25 MG tablet Take 1 tablet by mouth daily.     losartan (COZAAR) 100 MG tablet Take 100  mg by mouth daily.     Multiple Vitamin (MULTIVITAMIN) capsule Take 1 capsule by mouth daily.     multivitamin-lutein (OCUVITE-LUTEIN) CAPS capsule Take 1 capsule by mouth daily.     Omega-3 Fatty Acids (FISH OIL) 1000 MG CAPS Take 1 capsule by mouth 1 day or 1 dose.     Pembrolizumab (KEYTRUDA IV) Inject into the vein.     terbinafine (LAMISIL) 1 % cream Apply 1 application topically daily as needed (irritation).     No current facility-administered medications for this encounter.    ECOG PERFORMANCE STATUS:  1 - Symptomatic but completely ambulatory  REVIEW OF SYSTEMS: Patient denies any weight loss, fatigue, weakness, fever, chills or night sweats. Patient denies any loss of vision, blurred vision. Patient denies any ringing  of the ears or hearing loss. No irregular heartbeat. Patient denies heart murmur or history of fainting. Patient denies any chest pain or pain radiating to her upper extremities. Patient denies any shortness of breath, difficulty breathing at night, cough or hemoptysis. Patient denies any swelling in the lower legs. Patient denies any nausea vomiting, vomiting of blood, or coffee ground material in the vomitus. Patient denies any stomach pain. Patient states has had normal bowel movements no significant constipation or diarrhea. Patient denies any dysuria, hematuria or significant nocturia. Patient denies any problems walking, swelling in the joints or loss of balance. Patient denies any skin changes, loss of hair or loss of weight. Patient denies any excessive worrying or anxiety or significant depression. Patient denies any problems with insomnia. Patient denies excessive thirst, polyuria, polydipsia. Patient denies any swollen glands, patient denies easy bruising or easy bleeding. Patient denies any recent infections, allergies or URI. Patient "s visual fields have not changed significantly in recent time.   PHYSICAL EXAM: BP 137/82   Pulse 66   Resp 14   Ht '5\' 9"'$   (1.753 m)   Wt 246 lb (111.6 kg)   BMI 36.33 kg/m  Patient is obese.  He does have pain on rotation of his left lower extremity.  Motor and sensory levels are equal and symmetric in the lower extremities.  No pain is elicited on deep palpation of the spine.  Well-developed well-nourished patient in NAD. HEENT reveals PERLA, EOMI, discs not visualized.  Oral cavity is clear. No oral mucosal lesions are identified. Neck is clear without evidence of cervical or supraclavicular adenopathy. Lungs are clear to A&P. Cardiac examination is essentially unremarkable with regular rate and rhythm without murmur rub or thrill. Abdomen is benign with no organomegaly or masses noted. Motor sensory and DTR levels are equal and symmetric in the upper and lower extremities. Cranial nerves II through XII are grossly intact. Proprioception is intact. No peripheral adenopathy or edema is identified. No motor or sensory levels are noted. Crude visual fields are within normal range.  LABORATORY DATA: Pathology reports reviewed    RADIOLOGY RESULTS: CT scans and MRI scans reviewed compatible with above-stated findings   IMPRESSION: Mid static  involvement of L2 as well as left femur in 76 year old male with known stage IV renal cell carcinoma  PLAN: At this time I recommended SBRT to both his L2 as well his left femur.  Would plan on delivering 30 Gray in 5 fractions.  Risks and benefits including extremely low side effect side effect profile of SBRT were reviewed with the patient and his daughter.  Most referring the patient to orthopedic surgery for consideration of possible stabilization perhaps an intramedullary rod based on orthopedic surgical evaluation.  Patient and daughter both compromise treatment plan well I personally set up and ordered CT simulation for early next week.  I would like to take this opportunity to thank you for allowing me to participate in the care of your patient.Noreene Filbert,  MD

## 2022-10-14 ENCOUNTER — Inpatient Hospital Stay: Payer: Medicare Other | Attending: Oncology

## 2022-10-14 ENCOUNTER — Inpatient Hospital Stay: Payer: Medicare Other

## 2022-10-14 ENCOUNTER — Encounter: Payer: Self-pay | Admitting: Oncology

## 2022-10-14 ENCOUNTER — Inpatient Hospital Stay (HOSPITAL_BASED_OUTPATIENT_CLINIC_OR_DEPARTMENT_OTHER): Payer: Medicare Other | Admitting: Oncology

## 2022-10-14 ENCOUNTER — Ambulatory Visit
Admission: RE | Admit: 2022-10-14 | Discharge: 2022-10-14 | Disposition: A | Discharge status: Home / Self Care | Payer: Medicare Other | Source: Ambulatory Visit | Attending: Radiation Oncology | Admitting: Radiation Oncology

## 2022-10-14 VITALS — BP 105/69 | HR 69 | Temp 98.2°F | Resp 18 | Wt 245.3 lb

## 2022-10-14 DIAGNOSIS — Z5112 Encounter for antineoplastic immunotherapy: Secondary | ICD-10-CM

## 2022-10-14 DIAGNOSIS — C642 Malignant neoplasm of left kidney, except renal pelvis: Secondary | ICD-10-CM | POA: Diagnosis not present

## 2022-10-14 DIAGNOSIS — Z79899 Other long term (current) drug therapy: Secondary | ICD-10-CM | POA: Insufficient documentation

## 2022-10-14 DIAGNOSIS — N1832 Chronic kidney disease, stage 3b: Secondary | ICD-10-CM

## 2022-10-14 DIAGNOSIS — C7951 Secondary malignant neoplasm of bone: Secondary | ICD-10-CM

## 2022-10-14 LAB — CBC WITH DIFFERENTIAL/PLATELET
Abs Immature Granulocytes: 0.03 10*3/uL (ref 0.00–0.07)
Basophils Absolute: 0.1 10*3/uL (ref 0.0–0.1)
Basophils Relative: 1 %
Eosinophils Absolute: 0.6 10*3/uL — ABNORMAL HIGH (ref 0.0–0.5)
Eosinophils Relative: 7 %
HCT: 42.9 % (ref 39.0–52.0)
Hemoglobin: 14.9 g/dL (ref 13.0–17.0)
Immature Granulocytes: 0 %
Lymphocytes Relative: 20 %
Lymphs Abs: 1.7 10*3/uL (ref 0.7–4.0)
MCH: 31.2 pg (ref 26.0–34.0)
MCHC: 34.7 g/dL (ref 30.0–36.0)
MCV: 89.7 fL (ref 80.0–100.0)
Monocytes Absolute: 0.7 10*3/uL (ref 0.1–1.0)
Monocytes Relative: 8 %
Neutro Abs: 5.2 10*3/uL (ref 1.7–7.7)
Neutrophils Relative %: 64 %
Platelets: 207 10*3/uL (ref 150–400)
RBC: 4.78 MIL/uL (ref 4.22–5.81)
RDW: 13.2 % (ref 11.5–15.5)
WBC: 8.4 10*3/uL (ref 4.0–10.5)
nRBC: 0 % (ref 0.0–0.2)

## 2022-10-14 LAB — COMPREHENSIVE METABOLIC PANEL
ALT: 33 U/L (ref 0–44)
AST: 24 U/L (ref 15–41)
Albumin: 4.4 g/dL (ref 3.5–5.0)
Alkaline Phosphatase: 43 U/L (ref 38–126)
Anion gap: 12 (ref 5–15)
BUN: 31 mg/dL — ABNORMAL HIGH (ref 8–23)
CO2: 27 mmol/L (ref 22–32)
Calcium: 9.2 mg/dL (ref 8.9–10.3)
Chloride: 98 mmol/L (ref 98–111)
Creatinine, Ser: 1.83 mg/dL — ABNORMAL HIGH (ref 0.61–1.24)
GFR, Estimated: 37 mL/min — ABNORMAL LOW (ref >60)
Glucose, Bld: 194 mg/dL — ABNORMAL HIGH (ref 70–99)
Potassium: 4.1 mmol/L (ref 3.5–5.1)
Sodium: 137 mmol/L (ref 135–145)
Total Bilirubin: 0.4 mg/dL (ref 0.3–1.2)
Total Protein: 6.9 g/dL (ref 6.5–8.1)

## 2022-10-14 MED ORDER — SODIUM CHLORIDE 0.9 % IV SOLN
Freq: Once | INTRAVENOUS | Status: AC
  Filled 2022-10-14: qty 250

## 2022-10-14 MED ORDER — SODIUM CHLORIDE 0.9 % IV SOLN
200.0000 mg | Freq: Once | INTRAVENOUS | Status: AC
  Administered 2022-10-14: 200 mg via INTRAVENOUS
  Filled 2022-10-14: qty 8

## 2022-10-14 NOTE — Patient Instructions (Signed)
[facility]  Discharge Instructions: Thank you for choosing [facility] to provide your oncology and hematology care.  If you have a lab appointment with the Cancer Center, please go directly to the Cancer Center and check in at the registration area.  Wear comfortable clothing and clothing appropriate for easy access to any Portacath or PICC line.   We strive to give you quality time with your provider. You may need to reschedule your appointment if you arrive late (15 or more minutes).  Arriving late affects you and other patients whose appointments are after yours.  Also, if you miss three or more appointments without notifying the office, you may be dismissed from the clinic at the provider's discretion.      For prescription refill requests, have your pharmacy contact our office and allow 72 hours for refills to be completed.    Today you received the following chemotherapy and/or immunotherapy agents Keytruda      To help prevent nausea and vomiting after your treatment, we encourage you to take your nausea medication as directed.  BELOW ARE SYMPTOMS THAT SHOULD BE REPORTED IMMEDIATELY: *FEVER GREATER THAN 100.4 F (38 C) OR HIGHER *CHILLS OR SWEATING *NAUSEA AND VOMITING THAT IS NOT CONTROLLED WITH YOUR NAUSEA MEDICATION *UNUSUAL SHORTNESS OF BREATH *UNUSUAL BRUISING OR BLEEDING *URINARY PROBLEMS (pain or burning when urinating, or frequent urination) *BOWEL PROBLEMS (unusual diarrhea, constipation, pain near the anus) TENDERNESS IN MOUTH AND THROAT WITH OR WITHOUT PRESENCE OF ULCERS (sore throat, sores in mouth, or a toothache) UNUSUAL RASH, SWELLING OR PAIN  UNUSUAL VAGINAL DISCHARGE OR ITCHING   Items with * indicate a potential emergency and should be followed up as soon as possible or go to the Emergency Department if any problems should occur.  Please show the CHEMOTHERAPY ALERT CARD or IMMUNOTHERAPY ALERT CARD at check-in to  the Emergency Department and triage nurse.  Should you have questions after your visit or need to cancel or reschedule your appointment, please contact [facility]  [phone] and follow the prompts.  Office hours are 8:00 a.m. to 4:30 p.m. Monday - Friday. Please note that voicemails left after 4:00 p.m. may not be returned until the following business day.  We are closed weekends and major holidays. You have access to a nurse at all times for urgent questions. Please call the main number to the clinic [phone] and follow the prompts.  For any non-urgent questions, you may also contact your provider using MyChart. We now offer e-Visits for anyone 18 and older to request care online for non-urgent symptoms. For details visit [email or web address].   Also download the MyChart app! Go to the app store, search "MyChart", open the app, select [facility], and log in with your MyChart username and password.  Masks are optional in the cancer centers. If you would like for your care team to wear a mask while they are taking care of you, please let them know. For doctor visits, patients may have with them one support person who is at least 78 years old. At this time, visitors are not allowed in the infusion area.   

## 2022-10-15 NOTE — Assessment & Plan Note (Signed)
Encourage oral hydration, avoid nephrotoxins 

## 2022-10-15 NOTE — Assessment & Plan Note (Signed)
Treatment plan listed above.  Monitor immunotherapy side effects 

## 2022-10-15 NOTE — Assessment & Plan Note (Signed)
left intertrochanteric metastatic lesion has enlarged on MRI  He has seen Radonc and there is plan of SBRT to L2 and left femur.  I have discussed with Dr.Chrystal, he will refer patient to see Orthopedic surgeon.

## 2022-10-15 NOTE — Assessment & Plan Note (Addendum)
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 Biopsy of L2 + metastatic carcinoma, stage IV, limited disease burden.  Currently on palliative immunotherapy Labs are reviewed and discussed with patient. Proceed with Keytruda.  Repeat CT scan in Jan 2024

## 2022-10-15 NOTE — Progress Notes (Signed)
Hematology/Oncology Progress note [phone] [phone]      Patient Care Team: Peter Body, MD as PCP - General (Family Medicine)  ASSESSMENT & PLAN:   Cancer Staging  Cancer of kidney, left [facility]) Staging form: Kidney, AJCC 8th Edition - Clinical stage from 06/25/2021: Stage III (cT3a, cNX, cM0) - Signed by Peter Server, MD on 08/06/2021   Cancer of kidney, left [facility]) History of stage III left RCC, Lytic bone lesion of L2 and intertrochanteric left femur, consistent with osseous metastasis. Biopsy of L2 + metastatic carcinoma, stage IV, limited disease burden.  Currently on palliative immunotherapy Labs are reviewed and discussed with patient. Proceed with Keytruda.  Repeat CT scan in Jan 2024  CKD (chronic kidney disease) stage 3, GFR 30-59 ml/min (HCC) Encourage oral hydration, avoid nephrotoxins  Encounter for antineoplastic immunotherapy Treatment plan listed above.  Monitor immunotherapy side effects  Metastasis to bone [facility]) left intertrochanteric metastatic lesion has enlarged on MRI  He has seen Radonc and there is plan of SBRT to L2 and left femur.  I have discussed with Peter Stuart, he will refer patient to see Orthopedic surgeon.    Orders Placed This Encounter  Procedures   CT CHEST ABDOMEN PELVIS WO CONTRAST    To be done the week of 1/ 22    Standing Status:   Future    Standing Expiration Date:   10/15/2023    Order Specific Question:   If indicated for the ordered procedure, I authorize the administration of contrast media per Radiology protocol    Answer:   Yes    Order Specific Question:   Preferred imaging location?    Answer:   [facility]    Order Specific Question:   Is Oral Contrast requested for this exam?    Answer:   Yes, Per Radiology protocol   T4, free    Standing Status:   Future    Standing Expiration Date:   11/26/2023   CBC with Differential    Standing Status:   Future    Standing Expiration Date:    11/26/2023   Comprehensive metabolic panel    Standing Status:   Future    Standing Expiration Date:   11/26/2023   TSH    Standing Status:   Future    Standing Expiration Date:   11/26/2023   Follow up 3 weeks lab MD Keytruda All questions were answered. The patient knows to call the clinic with any problems, questions or concerns.  Peter Server, MD, PhD [facility] Oncology 10/14/2022   CHIEF COMPLAINTS/REASON FOR VISIT:  RCC  HISTORY OF PRESENTING ILLNESS:   Peter Stuart is a  76 y.o.  male presents for follow-up of history of left RCC and history of prostate cancer. Oncology history summary listed as below. Oncology History  Cancer of kidney, left ([place])  2009 Initial Diagnosis   History of prostate cancer treated with robotic prostatectomy initial   04/27/2021 Imaging   CT abdomen pelvis showed acute appendicitis without evidence of perforation or abscess.  Large left renal mass consistent with renal malignancy with renal vein invasion.  Focal annular thickening of descending colon.  05/01/2021 CT chest with contrast showed large sliding-type hiatal hernia.  Mild bilateral posterior bibasilar subsegmental atelectasis   06/25/2021 Initial Diagnosis   Cancer of kidney, left  06/25/2021, patient underwent radical nephrectomy by Dr. Diamantina Stuart. Pathology showed clear-cell renal cell carcinoma, nuclear grade 2.  Benign adrenal tissue.  Lymphovascular invasion present. pT3a pNx  06/25/2021 Cancer Staging   Staging form: Kidney, AJCC 8th Edition - Clinical stage from 06/25/2021: Stage III (cT3a, cNX, cM0) - Signed by Peter Server, MD on 08/06/2021 Stage prefix: Initial diagnosis   05/29/2022 Imaging   CT chest abdomen pelvis with contrast showed -1. New lytic lesions in the L2 vertebral Stuart and intertrochanteric left femur are most consistent with osseous metastatic disease. 2. Prior left nephrectomy with unchanged size of the soft tissue and fluid collection in the nephrectomy bed  favored to reflect postsurgical change given stability. Continued attention on follow-up imaging suggested. 3. Small lucent focus in the T11 vertebral Stuart is nonspecific but stable from prior imaging suggestive of a benign etiology, attention on follow-up imaging suggested. 4. Stable small bilateral pulmonary nodules, continued attention on follow-up imaging suggested. 5. Large hiatal hernia/intrathoracic stomach also containing a portion of the pancreatic Stuart. 6. Prior prostatectomy without suspicious enhancing nodularity in the prostatectomy bed.   07/15/2022 Procedure   L2 vertebral Stuart CT guided biopsy showed positive for malignancy, metastatic carcinoma, morphologically compatible with known clear cell renal cell carcinoma.    07/19/2022 -  Chemotherapy   RENAL CELL Pembrolizumab (200)       INTERVAL HISTORY Peter Stuart is a 76 y.o. male who has above history reviewed by me today presents for follow up visit for metastatic RCC Left hip pain and back pain, he will start SBRT to left femur, as well as L2 No other new complaints.   Review of Systems  Constitutional:  Negative for appetite change, chills, fever and unexpected weight change.  HENT:   Negative for hearing loss and voice change.   Eyes:  Negative for eye problems and icterus.  Respiratory:  Negative for chest tightness, cough and shortness of breath.   Cardiovascular:  Negative for chest pain and leg swelling.  Gastrointestinal:  Negative for abdominal distention and abdominal pain.  Endocrine: Negative for hot flashes.  Genitourinary:  Negative for difficulty urinating, dysuria and frequency.   Musculoskeletal:  Positive for back pain. Negative for arthralgias.       Left hip pain  Skin:  Negative for itching.  Neurological:  Negative for light-headedness and numbness.  Hematological:  Negative for adenopathy. Does not bruise/bleed easily.  Psychiatric/Behavioral:  Negative for confusion.     MEDICAL HISTORY:   Past Medical History:  Diagnosis Date   Anemia    Cancer ([place])    Complication of anesthesia    slow to wake after 1 surgery   CPAP use counseling    HTN (hypertension)    OSA on CPAP    Prostate cancer (HCC)    Seasonal allergies    Skin cancer     SURGICAL HISTORY: Past Surgical History:  Procedure Laterality Date   HERNIA REPAIR     IR FLUORO GUIDED NEEDLE PLC ASPIRATION/INJECTION LOC  07/02/2022   LAPAROSCOPIC APPENDECTOMY N/A 04/27/2021   Procedure: APPENDECTOMY LAPAROSCOPIC;  Surgeon: Fredirick Maudlin, MD;  Location: [facility] ORS;  Service: General;  Laterality: N/A;   LAPAROSCOPIC NEPHRECTOMY, HAND ASSISTED Left 06/25/2021   Procedure: HAND ASSISTED LAPAROSCOPIC NEPHRECTOMY;  Surgeon: Billey Co, MD;  Location: [facility] ORS;  Service: Urology;  Laterality: Left;   prostatectomy     REPAIR KNEE LIGAMENT     SHOULDER ARTHROSCOPY WITH SUBACROMIAL DECOMPRESSION AND OPEN ROTATOR C Right 12/29/2020   Procedure: Right shoulder arthroscopic rotator cuff repair, subacromial decompression, and biceps tenodesis;  Surgeon: Leim Fabry, MD;  Location: [facility];  Service: Orthopedics;  Laterality:  Right;   TONSILLECTOMY     VASECTOMY      SOCIAL HISTORY: Social History   Socioeconomic History   Marital status: Widowed    Spouse name: Not on file   Number of children: Not on file   Years of education: Not on file   Highest education level: Not on file  Occupational History   Not on file  Tobacco Use   Smoking status: Never    Passive exposure: Never   Smokeless tobacco: Never  Vaping Use   Vaping Use: Never used  Substance and Sexual Activity   Alcohol use: Not Currently   Drug use: Never   Sexual activity: Not Currently  Other Topics Concern   Not on file  Social History Narrative   Not on file   Social Determinants of Health   Financial Resource Strain: Not on file  Food Insecurity: Not on file  Transportation Needs: Not on file  Physical Activity: Not on  file  Stress: Not on file  Social Connections: Not on file  Intimate Partner Violence: Not on file    FAMILY HISTORY: Family History  Problem Relation Age of Onset   Stroke Mother    Hypertension Father    Emphysema Father     ALLERGIES:  has No Known Allergies.  MEDICATIONS:  Current Outpatient Medications  Medication Sig Dispense Refill   acetaminophen (TYLENOL) 500 MG tablet Take by mouth.     ALLEGRA ALLERGY 60 MG tablet Take 60 mg by mouth daily.     cholecalciferol (VITAMIN D3) 25 MCG (1000 UNIT) tablet Take 1,000 Units by mouth daily.     clotrimazole (LOTRIMIN) 1 % cream Apply topically 2 (two) times daily.     Coenzyme Q10 10 MG capsule Take 10 mg by mouth daily.     diphenhydrAMINE-Zinc Acetate (BENADRYL ITCH RELIEF EX) Apply topically.     ezetimibe (ZETIA) 10 MG tablet Take 1 tablet by mouth daily.     fluticasone (FLONASE) 50 MCG/ACT nasal spray Place into the nose.     gabapentin (NEURONTIN) 400 MG capsule Take 600 mg by mouth at bedtime.     GINKGO BILOBA COMPLEX PO Take by mouth.     hydrochlorothiazide (HYDRODIURIL) 25 MG tablet Take 1 tablet by mouth daily.     losartan (COZAAR) 100 MG tablet Take 100 mg by mouth daily.     Multiple Vitamin (MULTIVITAMIN) capsule Take 1 capsule by mouth daily.     multivitamin-lutein (OCUVITE-LUTEIN) CAPS capsule Take 1 capsule by mouth daily.     Omega-3 Fatty Acids (FISH OIL) 1000 MG CAPS Take 1 capsule by mouth 1 day or 1 dose.     Pembrolizumab (KEYTRUDA IV) Inject into the vein.     terbinafine (LAMISIL) 1 % cream Apply 1 application topically daily as needed (irritation).     No current facility-administered medications for this visit.     PHYSICAL EXAMINATION: ECOG PERFORMANCE STATUS: 1 - Symptomatic but completely ambulatory Vitals:   10/14/22 1325  BP: 105/69  Pulse: 69  Resp: 18  Temp: 98.2 F (36.8 C)   Filed Weights   10/14/22 1325  Weight: 245 lb 4.8 oz (111.3 kg)    Physical Exam Constitutional:       General: He is not in acute distress.    Appearance: He is obese.  HENT:     Head: Normocephalic and atraumatic.  Eyes:     General: No scleral icterus. Cardiovascular:     Rate and Rhythm: Normal  rate.     Heart sounds: Normal heart sounds.  Pulmonary:     Effort: Pulmonary effort is normal. No respiratory distress.     Breath sounds: No wheezing.  Abdominal:     General: Bowel sounds are normal. There is no distension.     Palpations: Abdomen is soft.  Musculoskeletal:        General: No deformity. Normal range of motion.     Cervical back: Normal range of motion and neck supple.  Skin:    General: Skin is warm and dry.     Findings: Rash present. No erythema.     Comments: Scattered rash on his shoulder, neck and back  Neurological:     Mental Status: He is alert and oriented to person, place, and time. Mental status is at baseline.     Cranial Nerves: No cranial nerve deficit.     Coordination: Coordination normal.  Psychiatric:        Mood and Affect: Mood normal.     LABORATORY DATA:  I have reviewed the data as listed    Latest Ref Rng & Units 10/14/2022   12:54 PM 09/23/2022    2:02 PM 09/02/2022    1:33 PM  CBC  WBC 4.0 - 10.5 K/uL 8.4  7.2  6.8   Hemoglobin 13.0 - 17.0 g/dL 14.9  14.4  13.9   Hematocrit 39.0 - 52.0 % 42.9  42.1  40.4   Platelets 150 - 400 K/uL 207  211  187       Latest Ref Rng & Units 10/14/2022   12:54 PM 09/23/2022    2:02 PM 09/02/2022    1:33 PM  CMP  Glucose 70 - 99 mg/dL 194  108  230   BUN 8 - 23 mg/dL 31  35  36   Creatinine 0.61 - 1.24 mg/dL 1.83  1.88  1.73   Sodium 135 - 145 mmol/L 137  135  132   Potassium 3.5 - 5.1 mmol/L 4.1  4.1  3.8   Chloride 98 - 111 mmol/L 98  101  98   CO2 22 - 32 mmol/L '27  26  24   '$ Calcium 8.9 - 10.3 mg/dL 9.2  9.1  8.7   Total Protein 6.5 - 8.1 g/dL 6.9  7.1  6.6   Total Bilirubin 0.3 - 1.2 mg/dL 0.4  0.4  0.4   Alkaline Phos 38 - 126 U/L 43  44  47   AST 15 - 41 U/L '24  23  31   '$ ALT  0 - 44 U/L 33  40  54     RADIOGRAPHIC STUDIES: I have personally reviewed the radiological images as listed and agreed with the findings in the report. MR HIP LEFT W WO CONTRAST  Result Date: 10/02/2022 CLINICAL DATA:  Renal cancer, left hip pain. EXAM: MRI OF THE LEFT HIP WITHOUT AND WITH CONTRAST TECHNIQUE: Multiplanar, multisequence MR imaging was performed both before and after administration of intravenous contrast. CONTRAST:  47m GADAVIST GADOBUTROL 1 MMOL/ML IV SOLN COMPARISON:  Radiographs 08/02/2022 and bone scan 06/11/2022 along the CT pelvis 05/29/2022. FINDINGS: Bones: Enhancing left femur intertrochanteric mass measuring 4.1 by 3.3 by 3.0 cm (volume = 21 cm^3)with mild endosteal scalloping along the medial cortex and mild associated medial periostitis. This lesion formerly measured about 2.2 by 1.8 by 1.8 cm (volume = 3.7 cm^3) on 05/29/2022 CT scan, thus by volume about 5.7 times is large today. I do not see an associated  fracture extending through the femur. New 1.9 by 1.7 by 2.0 cm metastatic lesion in the right posterior ischium, image 14 series 4. No other regional metastatic lesions are identified. Mild degenerative spurring of the acetabula and femoral heads. Mild spurring of the pubis. Minimal findings of chronic arthropathy of the left SI joint. Articular cartilage and labrum Articular cartilage:  Moderate degenerative chondral thinning. Labrum: Probable mild fraying of the anterior superior labrum without a well-defined tear. Joint or bursal effusion Joint effusion:  Absent Bursae: No significant regional bursitis. Muscles and tendons Muscles and tendons:  Unremarkable Other findings Miscellaneous:   Sigmoid colon diverticulosis.  Prostatectomy. IMPRESSION: 1. The dominant left intertrochanteric metastatic lesion has enlarged from the prior CT scan of 05/29/2022, currently measuring 4.1 by 3.3 by 3.0 cm. No associated pathologic fracture although there is some mild adjacent medial  periostitis. 2. There is also a new metastatic lesion in the right posterior ischium measuring 1.9 by 1.7 by 2.0 cm. 3. Moderate degenerative chondral thinning in the hips. Mild spurring. 4. Sigmoid colon diverticulosis. 5. Prostatectomy. Electronically Signed   By: Van Clines M.D.   On: 10/02/2022 16:14

## 2022-10-16 DIAGNOSIS — Z5112 Encounter for antineoplastic immunotherapy: Secondary | ICD-10-CM | POA: Diagnosis not present

## 2022-10-20 ENCOUNTER — Emergency Department: Payer: Medicare Other

## 2022-10-20 ENCOUNTER — Encounter: Payer: Self-pay | Admitting: Internal Medicine

## 2022-10-20 ENCOUNTER — Emergency Department
Admission: EM | Admit: 2022-10-20 | Discharge: 2022-10-20 | Disposition: A | Discharge status: Home / Self Care | Payer: Medicare Other | Attending: Emergency Medicine | Admitting: Emergency Medicine

## 2022-10-20 ENCOUNTER — Other Ambulatory Visit: Payer: Self-pay

## 2022-10-20 DIAGNOSIS — N189 Chronic kidney disease, unspecified: Secondary | ICD-10-CM | POA: Diagnosis not present

## 2022-10-20 DIAGNOSIS — Z8546 Personal history of malignant neoplasm of prostate: Secondary | ICD-10-CM | POA: Diagnosis not present

## 2022-10-20 DIAGNOSIS — E119 Type 2 diabetes mellitus without complications: Secondary | ICD-10-CM | POA: Diagnosis not present

## 2022-10-20 DIAGNOSIS — I129 Hypertensive chronic kidney disease with stage 1 through stage 4 chronic kidney disease, or unspecified chronic kidney disease: Secondary | ICD-10-CM | POA: Diagnosis not present

## 2022-10-20 DIAGNOSIS — Z85848 Personal history of malignant neoplasm of other parts of nervous tissue: Secondary | ICD-10-CM | POA: Insufficient documentation

## 2022-10-20 DIAGNOSIS — M546 Pain in thoracic spine: Secondary | ICD-10-CM | POA: Diagnosis not present

## 2022-10-20 DIAGNOSIS — M549 Dorsalgia, unspecified: Secondary | ICD-10-CM | POA: Diagnosis present

## 2022-10-20 MED ORDER — TIZANIDINE HCL 2 MG PO TABS: 2.0000 mg | ORAL_TABLET | Freq: Three times a day (TID) | ORAL | 0 refills | Status: DC | PRN

## 2022-10-20 MED ORDER — OXYCODONE HCL 5 MG PO TABS: 5.0000 mg | ORAL_TABLET | Freq: Four times a day (QID) | ORAL | 0 refills | Status: DC | PRN

## 2022-10-20 MED ORDER — OXYCODONE HCL 5 MG PO TABS
5.0000 mg | ORAL_TABLET | Freq: Once | ORAL | Status: AC
  Administered 2022-10-20: 5 mg via ORAL
  Filled 2022-10-20: qty 1

## 2022-10-20 NOTE — ED Provider Notes (Signed)
[facility] Provider Note    Event Date/Time   First MD Initiated Contact with Patient 10/20/22 [phone]     (approximate)   History   Back Pain   HPI  Peter Stuart is a 76 y.o. male   presents to the ED with complaint of upper right-sided back pain that started at 9 PM last evening while bending over putting on his socks.  Patient states that he slept in the recliner last evening thinking that it would be much better today.  Patient states pain continues.  He was recently diagnosed with metastasis to the bone from his prostate cancer but has not started chemotherapy as of this date.  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  Patient has a history of prostate cancer, recent metastasis to the spine, hypertension, chronic kidney disease, type 2 diabetes, nephrectomy left side.      Physical Exam   Triage Vital Signs: ED Triage Vitals [10/20/22 0848]  Enc Vitals Group     BP 111/76     Pulse Rate 97     Resp 16     Temp 98 F (36.7 C)     Temp Source Oral     SpO2 94 %     Weight 235 lb (106.6 kg)     Height '5\' 9"'$  (1.753 m)     Head Circumference      Peak Flow      Pain Score 6     Pain Loc      Pain Edu?      Excl. in [place]?     Most recent vital signs: Vitals:   10/20/22 0848  BP: 111/76  Pulse: 97  Resp: 16  Temp: 98 F (36.7 C)  SpO2: 94%     General: Awake, no distress.  CV:  Good peripheral perfusion.  Heart regular rate and rhythm. Resp:  Normal effort.  Lungs are clear bilaterally. Abd:  No distention.  Soft, nontender.  Other:  On examination of the back there is no gross deformity however there is tenderness on palpation of the right thoracic vertebral muscles approximately T6-T7, T7-T8 and at the tip of the right scapula area.  Range of motion is slow and guarded.  Skin is intact.  No discoloration present.   ED Results / Procedures / Treatments   Labs (all labs ordered are listed, but only abnormal results  are displayed) Labs Reviewed - No data to display    RADIOLOGY  Thoracic spine x-ray per radiologist is negative however radiologist suggested that an MRI be done due to patient's history.  MRI thoracic spine per radiologist is negative for any new lesions.  The same metastatic bone lesion noted previously on CT scan on L2 is seen on the MRI.  No acute findings per radiologist.   PROCEDURES:  Critical Care performed:   Procedures   MEDICATIONS ORDERED IN ED: Medications  oxyCODONE (Oxy IR/ROXICODONE) immediate release tablet 5 mg (5 mg Oral Given 10/20/22 1032)     IMPRESSION / MDM / ASSESSMENT AND PLAN / ED COURSE  I reviewed the triage vital signs and the nursing notes.   Differential diagnosis includes, but is not limited to, acute thoracic spine pain, muscle skeletal pain, muscle strain, new metastatic bone lesion.  76 year old male presents to the ED with complaint of right sided thoracic pain that occurred last evening while putting on his socks.  Patient denies any direct trauma.  He states that  he slept in the recliner which he normally does thinking that it would be much better today which it has not.  Patient recently was told that he has metastatic bone cancer as a result of his prostate cancer with 1 lesion being noted in his lumbar spine.  Patient has not yet started chemotherapy.  Lumbar spine x-ray was negative however radiologist suggested that an MRI be done of the thoracic spine due to patient's history.  This was negative for any new metastatic findings and patient was reassured that the only lesion thus far is the same 1 that he has known about at L2.  Prior to x-ray patient was given oxycodone 5 mg p.o. and states that it is helping to ease the pain.  I discussed with him the use of a muscle relaxant at this time and to continue with the oxycodone.  Patient voices that he does not want to take oxycodone for very long.  We also discussed use of ice or heat to his  back as needed for discomfort and follow-up with his PCP if needed.  Prescription for Zanaflex 2 mg every 8 hours and oxycodone 5 mg IR every 6 hours was sent to his pharmacy.      Patient's presentation is most consistent with acute illness / injury with system symptoms.  FINAL CLINICAL IMPRESSION(S) / ED DIAGNOSES   Final diagnoses:  Acute right-sided thoracic back pain     Rx / DC Orders   ED Discharge Orders          Ordered    oxyCODONE (OXY IR/ROXICODONE) 5 MG immediate release tablet  Every 6 hours PRN        10/20/22 1337    tiZANidine (ZANAFLEX) 2 MG tablet  Every 8 hours PRN        10/20/22 1337             Note:  This document was prepared using Dragon voice recognition software and may include unintentional dictation errors.   Johnn Hai, PA-C 10/20/22 1408    Nena Polio, MD 10/20/22 1414

## 2022-10-20 NOTE — Discharge Instructions (Addendum)
Follow-up with your primary care provider if any continued problems or concerns.  A prescription for Zanaflex 2 mg to be taken every 8 hours as needed for muscle spasm sent to your pharmacy along with oxycodone 5 mg every 6 hours if needed for severe pain.  You may also take Tylenol with this medication if needed.  Use ice or heat to your back as needed for discomfort.  Remember to take the pain medication before your pain level is out of control which will require a longer time of getting your pain back in control.  The oxycodone in the emergency department was given at 10:32 AM.

## 2022-10-20 NOTE — ED Notes (Signed)
Pt arrived with complaint of back pain. Pt states he has stage 4 kidney cancer that has spread to spine. Pt states the pain started around 9pm last night. Pt denies urinary sx or fevers. Pt A&Ox4, NAD.

## 2022-10-20 NOTE — Progress Notes (Signed)
Returned the patient's phone call regarding new onset of upper back pain.  Offered narcotic pain medication/symptomatic treatment and follow-up with the cancer center.  However given the intensity of the pain patient will prefer to go to the emergency room for further evaluation.  Will inform Dr. Tasia Catchings. Doren Custard

## 2022-10-20 NOTE — ED Triage Notes (Signed)
PT states he does not have shoulder pain, but upper back pain to the right. Pt states stage 4 kidney cancer that has spread to his spine and femur.  Pt states back pain started at 9pm, when bending over and putting on his socks.

## 2022-10-20 NOTE — ED Triage Notes (Signed)
First RN:  Pt coming in for right shoulder pain that started last night. 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 Pt ambulatory on scene. Pt complaining of left femur pain, however, the cancer has spread to that extremity, as per EMS  Pt from home and BIB [facility].

## 2022-10-30 ENCOUNTER — Other Ambulatory Visit: Payer: Self-pay

## 2022-10-30 ENCOUNTER — Ambulatory Visit
Admission: RE | Admit: 2022-10-30 | Discharge: 2022-10-30 | Disposition: A | Discharge status: Home / Self Care | Payer: Medicare Other | Source: Ambulatory Visit | Attending: Radiation Oncology | Admitting: Radiation Oncology

## 2022-10-30 DIAGNOSIS — Z510 Encounter for antineoplastic radiation therapy: Secondary | ICD-10-CM | POA: Insufficient documentation

## 2022-10-30 DIAGNOSIS — E785 Hyperlipidemia, unspecified: Secondary | ICD-10-CM | POA: Diagnosis not present

## 2022-10-30 DIAGNOSIS — E1169 Type 2 diabetes mellitus with other specified complication: Secondary | ICD-10-CM | POA: Diagnosis not present

## 2022-10-30 DIAGNOSIS — C642 Malignant neoplasm of left kidney, except renal pelvis: Secondary | ICD-10-CM | POA: Diagnosis not present

## 2022-10-30 DIAGNOSIS — Z5112 Encounter for antineoplastic immunotherapy: Secondary | ICD-10-CM | POA: Insufficient documentation

## 2022-10-30 DIAGNOSIS — N1832 Chronic kidney disease, stage 3b: Secondary | ICD-10-CM | POA: Insufficient documentation

## 2022-10-30 DIAGNOSIS — G893 Neoplasm related pain (acute) (chronic): Secondary | ICD-10-CM | POA: Diagnosis not present

## 2022-10-30 DIAGNOSIS — C7951 Secondary malignant neoplasm of bone: Secondary | ICD-10-CM | POA: Diagnosis present

## 2022-10-30 LAB — RAD ONC ARIA SESSION SUMMARY
Course Elapsed Days: 0
Plan Fractions Treated to Date: 1
Plan Prescribed Dose Per Fraction: 6 Gy
Plan Total Fractions Prescribed: 5
Plan Total Prescribed Dose: 30 Gy
Reference Point Dosage Given to Date: 6 Gy
Reference Point Session Dosage Given: 6 Gy
Session Number: 1

## 2022-10-31 ENCOUNTER — Ambulatory Visit
Admission: RE | Admit: 2022-10-31 | Discharge: 2022-10-31 | Disposition: A | Discharge status: Home / Self Care | Payer: Medicare Other | Source: Ambulatory Visit | Attending: Radiation Oncology | Admitting: Radiation Oncology

## 2022-10-31 ENCOUNTER — Other Ambulatory Visit: Payer: Self-pay

## 2022-10-31 DIAGNOSIS — Z5112 Encounter for antineoplastic immunotherapy: Secondary | ICD-10-CM | POA: Diagnosis not present

## 2022-10-31 LAB — RAD ONC ARIA SESSION SUMMARY
Course Elapsed Days: 1
Plan Fractions Treated to Date: 2
Plan Prescribed Dose Per Fraction: 6 Gy
Plan Total Fractions Prescribed: 5
Plan Total Prescribed Dose: 30 Gy
Reference Point Dosage Given to Date: 12 Gy
Reference Point Session Dosage Given: 6 Gy
Session Number: 2

## 2022-11-01 ENCOUNTER — Other Ambulatory Visit: Payer: Self-pay

## 2022-11-01 ENCOUNTER — Ambulatory Visit
Admission: RE | Admit: 2022-11-01 | Discharge: 2022-11-01 | Disposition: A | Discharge status: Home / Self Care | Payer: Medicare Other | Source: Ambulatory Visit | Attending: Radiation Oncology | Admitting: Radiation Oncology

## 2022-11-01 DIAGNOSIS — Z5112 Encounter for antineoplastic immunotherapy: Secondary | ICD-10-CM | POA: Diagnosis not present

## 2022-11-01 LAB — RAD ONC ARIA SESSION SUMMARY
Course Elapsed Days: 2
Plan Fractions Treated to Date: 3
Plan Prescribed Dose Per Fraction: 6 Gy
Plan Total Fractions Prescribed: 5
Plan Total Prescribed Dose: 30 Gy
Reference Point Dosage Given to Date: 18 Gy
Reference Point Session Dosage Given: 6 Gy
Session Number: 3

## 2022-11-04 ENCOUNTER — Encounter: Payer: Self-pay | Admitting: Oncology

## 2022-11-04 ENCOUNTER — Inpatient Hospital Stay: Payer: Medicare Other

## 2022-11-04 ENCOUNTER — Ambulatory Visit
Admission: RE | Admit: 2022-11-04 | Discharge: 2022-11-04 | Disposition: A | Discharge status: Home / Self Care | Payer: Medicare Other | Source: Ambulatory Visit | Attending: Radiation Oncology | Admitting: Radiation Oncology

## 2022-11-04 ENCOUNTER — Other Ambulatory Visit: Payer: Self-pay

## 2022-11-04 ENCOUNTER — Inpatient Hospital Stay: Payer: Medicare Other | Attending: Oncology

## 2022-11-04 ENCOUNTER — Inpatient Hospital Stay (HOSPITAL_BASED_OUTPATIENT_CLINIC_OR_DEPARTMENT_OTHER): Payer: Medicare Other | Admitting: Oncology

## 2022-11-04 VITALS — BP 110/74 | HR 65 | Temp 98.5°F | Resp 18 | Ht 69.0 in | Wt 239.0 lb

## 2022-11-04 DIAGNOSIS — G893 Neoplasm related pain (acute) (chronic): Secondary | ICD-10-CM | POA: Diagnosis not present

## 2022-11-04 DIAGNOSIS — C642 Malignant neoplasm of left kidney, except renal pelvis: Secondary | ICD-10-CM | POA: Insufficient documentation

## 2022-11-04 DIAGNOSIS — Z79899 Other long term (current) drug therapy: Secondary | ICD-10-CM | POA: Insufficient documentation

## 2022-11-04 DIAGNOSIS — C7951 Secondary malignant neoplasm of bone: Secondary | ICD-10-CM

## 2022-11-04 DIAGNOSIS — Z5112 Encounter for antineoplastic immunotherapy: Secondary | ICD-10-CM | POA: Diagnosis not present

## 2022-11-04 DIAGNOSIS — N1832 Chronic kidney disease, stage 3b: Secondary | ICD-10-CM | POA: Diagnosis not present

## 2022-11-04 LAB — COMPREHENSIVE METABOLIC PANEL
ALT: 27 U/L (ref 0–44)
AST: 22 U/L (ref 15–41)
Albumin: 3.9 g/dL (ref 3.5–5.0)
Alkaline Phosphatase: 46 U/L (ref 38–126)
Anion gap: 7 (ref 5–15)
BUN: 30 mg/dL — ABNORMAL HIGH (ref 8–23)
CO2: 27 mmol/L (ref 22–32)
Calcium: 9 mg/dL (ref 8.9–10.3)
Chloride: 102 mmol/L (ref 98–111)
Creatinine, Ser: 1.81 mg/dL — ABNORMAL HIGH (ref 0.61–1.24)
GFR, Estimated: 38 mL/min — ABNORMAL LOW (ref >60)
Glucose, Bld: 97 mg/dL (ref 70–99)
Potassium: 3.7 mmol/L (ref 3.5–5.1)
Sodium: 136 mmol/L (ref 135–145)
Total Bilirubin: 0.6 mg/dL (ref 0.3–1.2)
Total Protein: 6.3 g/dL — ABNORMAL LOW (ref 6.5–8.1)

## 2022-11-04 LAB — CBC WITH DIFFERENTIAL/PLATELET
Abs Immature Granulocytes: 0.02 10*3/uL (ref 0.00–0.07)
Basophils Absolute: 0.1 10*3/uL (ref 0.0–0.1)
Basophils Relative: 1 %
Eosinophils Absolute: 0.5 10*3/uL (ref 0.0–0.5)
Eosinophils Relative: 8 %
HCT: 40.8 % (ref 39.0–52.0)
Hemoglobin: 14.1 g/dL (ref 13.0–17.0)
Immature Granulocytes: 0 %
Lymphocytes Relative: 16 %
Lymphs Abs: 1 10*3/uL (ref 0.7–4.0)
MCH: 30.4 pg (ref 26.0–34.0)
MCHC: 34.6 g/dL (ref 30.0–36.0)
MCV: 87.9 fL (ref 80.0–100.0)
Monocytes Absolute: 0.7 10*3/uL (ref 0.1–1.0)
Monocytes Relative: 10 %
Neutro Abs: 4.1 10*3/uL (ref 1.7–7.7)
Neutrophils Relative %: 65 %
Platelets: 198 10*3/uL (ref 150–400)
RBC: 4.64 MIL/uL (ref 4.22–5.81)
RDW: 13 % (ref 11.5–15.5)
WBC: 6.3 10*3/uL (ref 4.0–10.5)
nRBC: 0 % (ref 0.0–0.2)

## 2022-11-04 LAB — RAD ONC ARIA SESSION SUMMARY
Course Elapsed Days: 5
Plan Fractions Treated to Date: 4
Plan Prescribed Dose Per Fraction: 6 Gy
Plan Total Fractions Prescribed: 5
Plan Total Prescribed Dose: 30 Gy
Reference Point Dosage Given to Date: 24 Gy
Reference Point Session Dosage Given: 6 Gy
Session Number: 4

## 2022-11-04 LAB — T4, FREE: Free T4: 0.92 ng/dL (ref 0.61–1.12)

## 2022-11-04 LAB — TSH: TSH: 0.976 u[IU]/mL (ref 0.350–4.500)

## 2022-11-04 MED ORDER — SODIUM CHLORIDE 0.9 % IV SOLN
Freq: Once | INTRAVENOUS | Status: AC
  Filled 2022-11-04: qty 250

## 2022-11-04 MED ORDER — SODIUM CHLORIDE 0.9 % IV SOLN
200.0000 mg | Freq: Once | INTRAVENOUS | Status: AC
  Administered 2022-11-04: 200 mg via INTRAVENOUS
  Filled 2022-11-04: qty 200

## 2022-11-04 NOTE — Patient Instructions (Signed)
[facility]  Discharge Instructions: Thank you for choosing [facility] to provide your oncology and hematology care.  If you have a lab appointment with the Cancer Center, please go directly to the Cancer Center and check in at the registration area.  Wear comfortable clothing and clothing appropriate for easy access to any Portacath or PICC line.   We strive to give you quality time with your provider. You may need to reschedule your appointment if you arrive late (15 or more minutes).  Arriving late affects you and other patients whose appointments are after yours.  Also, if you miss three or more appointments without notifying the office, you may be dismissed from the clinic at the provider's discretion.      For prescription refill requests, have your pharmacy contact our office and allow 72 hours for refills to be completed.    Today you received the following chemotherapy and/or immunotherapy agents KEYTRUDA      To help prevent nausea and vomiting after your treatment, we encourage you to take your nausea medication as directed.  BELOW ARE SYMPTOMS THAT SHOULD BE REPORTED IMMEDIATELY: *FEVER GREATER THAN 100.4 F (38 C) OR HIGHER *CHILLS OR SWEATING *NAUSEA AND VOMITING THAT IS NOT CONTROLLED WITH YOUR NAUSEA MEDICATION *UNUSUAL SHORTNESS OF BREATH *UNUSUAL BRUISING OR BLEEDING *URINARY PROBLEMS (pain or burning when urinating, or frequent urination) *BOWEL PROBLEMS (unusual diarrhea, constipation, pain near the anus) TENDERNESS IN MOUTH AND THROAT WITH OR WITHOUT PRESENCE OF ULCERS (sore throat, sores in mouth, or a toothache) UNUSUAL RASH, SWELLING OR PAIN  UNUSUAL VAGINAL DISCHARGE OR ITCHING   Items with * indicate a potential emergency and should be followed up as soon as possible or go to the Emergency Department if any problems should occur.  Please show the CHEMOTHERAPY ALERT CARD or IMMUNOTHERAPY ALERT CARD at check-in to  the Emergency Department and triage nurse.  Should you have questions after your visit or need to cancel or reschedule your appointment, please contact [facility]  [phone] and follow the prompts.  Office hours are 8:00 a.m. to 4:30 p.m. Monday - Friday. Please note that voicemails left after 4:00 p.m. may not be returned until the following business day.  We are closed weekends and major holidays. You have access to a nurse at all times for urgent questions. Please call the main number to the clinic [phone] and follow the prompts.  For any non-urgent questions, you may also contact your provider using MyChart. We now offer e-Visits for anyone 18 and older to request care online for non-urgent symptoms. For details visit [email or web address].   Also download the MyChart app! Go to the app store, search "MyChart", open the app, select [facility], and log in with your MyChart username and password.   Pembrolizumab Injection What is this medication? PEMBROLIZUMAB (PEM broe LIZ ue mab) treats some types of cancer. It works by helping your immune system slow or stop the spread of cancer cells. It is a monoclonal antibody. This medicine may be used for other purposes; ask your health care provider or pharmacist if you have questions. COMMON BRAND NAME(S): Keytruda What should I tell my care team before I take this medication? They need to know if you have any of these conditions: Allogeneic stem cell transplant (uses someone else's stem cells) Autoimmune diseases, such as Crohn disease, ulcerative colitis, lupus History of chest radiation Nervous system problems, such as Guillain-Barre syndrome, myasthenia gravis Organ   transplant An unusual or allergic reaction to pembrolizumab, other medications, foods, dyes, or preservatives Pregnant or trying to get pregnant Breast-feeding How should I use this medication? This medication is injected into a vein.  It is given by your care team in a hospital or clinic setting. A special MedGuide will be given to you before each treatment. Be sure to read this information carefully each time. Talk to your care team about the use of this medication in children. While it may be prescribed for children as young as 6 months for selected conditions, precautions do apply. Overdosage: If you think you have taken too much of this medicine contact a poison control center or emergency room at once. NOTE: This medicine is only for you. Do not share this medicine with others. What if I miss a dose? Keep appointments for follow-up doses. It is important not to miss your dose. Call your care team if you are unable to keep an appointment. What may interact with this medication? Interactions have not been studied. This list may not describe all possible interactions. Give your health care provider a list of all the medicines, herbs, non-prescription drugs, or dietary supplements you use. Also tell them if you smoke, drink alcohol, or use illegal drugs. Some items may interact with your medicine. What should I watch for while using this medication? Your condition will be monitored carefully while you are receiving this medication. You may need blood work while taking this medication. This medication may cause serious skin reactions. They can happen weeks to months after starting the medication. Contact your care team right away if you notice fevers or flu-like symptoms with a rash. The rash may be red or purple and then turn into blisters or peeling of the skin. You may also notice a red rash with swelling of the face, lips, or lymph nodes in your neck or under your arms. Tell your care team right away if you have any change in your eyesight. Talk to your care team if you may be pregnant. Serious birth defects can occur if you take this medication during pregnancy and for 4 months after the last dose. You will need a negative  pregnancy test before starting this medication. Contraception is recommended while taking this medication and for 4 months after the last dose. Your care team can help you find the option that works for you. Do not breastfeed while taking this medication and for 4 months after the last dose. What side effects may I notice from receiving this medication? Side effects that you should report to your care team as soon as possible: Allergic reactions--skin rash, itching, hives, swelling of the face, lips, tongue, or throat Dry cough, shortness of breath or trouble breathing Eye pain, redness, irritation, or discharge with blurry or decreased vision Heart muscle inflammation--unusual weakness or fatigue, shortness of breath, chest pain, fast or irregular heartbeat, dizziness, swelling of the ankles, feet, or hands Hormone gland problems--headache, sensitivity to light, unusual weakness or fatigue, dizziness, fast or irregular heartbeat, increased sensitivity to cold or heat, excessive sweating, constipation, hair loss, increased thirst or amount of urine, tremors or shaking, irritability Infusion reactions--chest pain, shortness of breath or trouble breathing, feeling faint or lightheaded Kidney injury (glomerulonephritis)--decrease in the amount of urine, red or dark brown urine, foamy or bubbly urine, swelling of the ankles, hands, or feet Liver injury--right upper belly pain, loss of appetite, nausea, light-colored stool, dark yellow or brown urine, yellowing skin or eyes, unusual weakness or   fatigue Pain, tingling, or numbness in the hands or feet, muscle weakness, change in vision, confusion or trouble speaking, loss of balance or coordination, trouble walking, seizures Rash, fever, and swollen lymph nodes Redness, blistering, peeling, or loosening of the skin, including inside the mouth Sudden or severe stomach pain, bloody diarrhea, fever, nausea, vomiting Side effects that usually do not require  medical attention (report to your care team if they continue or are bothersome): Bone, joint, or muscle pain Diarrhea Fatigue Loss of appetite Nausea Skin rash This list may not describe all possible side effects. Call your doctor for medical advice about side effects. You may report side effects to FDA at 1-800-FDA-1088. Where should I keep my medication? This medication is given in a hospital or clinic. It will not be stored at home. NOTE: This sheet is a summary. It may not cover all possible information. If you have questions about this medicine, talk to your doctor, pharmacist, or health care provider.  2023 Elsevier/Gold Standard (2013-07-05 00:00:00)    

## 2022-11-04 NOTE — Assessment & Plan Note (Signed)
Treatment plan listed above.  Monitor immunotherapy side effects 

## 2022-11-04 NOTE — Assessment & Plan Note (Signed)
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 Biopsy of L2 + metastatic carcinoma, stage IV, limited disease burden.  Currently on palliative immunotherapy Labs are reviewed and discussed with patient. Proceed with Keytruda.  Repeat CT scan in Jan 2024

## 2022-11-04 NOTE — Progress Notes (Signed)
Patient states that his legs are still hurting and maybe feel a little worse. Dr Baruch Gouty said that they should stop after radiation over the weekend. He complains of constipation and drink prune juice. He has neuropathy in feet. He has lost 6 pounds since last appt here.

## 2022-11-04 NOTE — Assessment & Plan Note (Signed)
left intertrochanteric metastatic lesion has enlarged on MRI  Follow up with Radonc, finish course of SBRT to L2 and left femur.  I have discussed with Dr.Chrystal, he will refer patient to see Orthopedic surgeon.

## 2022-11-04 NOTE — Progress Notes (Signed)
Hematology/Oncology Progress note [phone] [phone]      Patient Care Team: Dion Body, MD as PCP - General (Family Medicine)  ASSESSMENT & PLAN:   Cancer Staging  Cancer of kidney, left [facility]) Staging form: Kidney, AJCC 8th Edition - Clinical stage from 06/25/2021: Stage III (cT3a, cNX, cM0) - Signed by [name], MD on 08/06/2021   Cancer of kidney, left [facility]) History of stage III left RCC, Lytic bone lesion of L2 and intertrochanteric left femur, consistent with osseous metastasis. Biopsy of L2 + metastatic carcinoma, stage IV, limited disease burden.  Currently on palliative immunotherapy Labs are reviewed and discussed with patient. Proceed with Keytruda.  Repeat CT scan in Jan 2024  Encounter for antineoplastic immunotherapy Treatment plan listed above.  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  I have discussed with Dr.Chrystal, he will refer patient to see Orthopedic surgeon.    No orders of the defined types were placed in this encounter.  Follow up 3 weeks lab MD [facility] All questions were answered. The patient knows to call the clinic with any problems, questions or concerns.  [name], MD, PhD [facility] Oncology 11/04/2022   CHIEF COMPLAINTS/REASON FOR VISIT:  RCC  HISTORY OF PRESENTING ILLNESS:   Peter Stuart is a  77 y.o.  male presents for follow-up of history of left RCC and history of prostate cancer. Oncology history summary listed as below. Oncology History  Cancer of kidney, left ([place])  2009 Initial Diagnosis   History of prostate cancer treated  with robotic prostatectomy initial   04/27/2021 Imaging   CT abdomen pelvis showed acute appendicitis without evidence of perforation or abscess.  Large left renal mass consistent with renal malignancy with renal vein invasion.  Focal annular thickening of descending colon.  05/01/2021 CT chest with contrast showed large sliding-type hiatal hernia.  Mild bilateral posterior bibasilar subsegmental atelectasis   06/25/2021 Initial Diagnosis   Cancer of kidney, left  06/25/2021, patient underwent radical nephrectomy by Dr. Diamantina Providence. Pathology showed clear-cell renal cell carcinoma, nuclear grade 2.  Benign adrenal tissue.  Lymphovascular invasion present. pT3a pNx    06/25/2021 Cancer Staging   Staging form: Kidney, AJCC 8th Edition - Clinical stage from 06/25/2021: Stage III (cT3a, cNX, cM0) - Signed by [name], MD on 08/06/2021 Stage prefix: Initial diagnosis   05/29/2022 Imaging   CT chest abdomen pelvis with contrast showed -1. New lytic lesions in the L2 vertebral body and intertrochanteric left femur are most consistent with osseous metastatic disease. 2. Prior left nephrectomy with unchanged size of the soft tissue and fluid collection in the nephrectomy bed favored to reflect postsurgical change given stability. Continued attention on follow-up imaging suggested. 3. Small lucent focus in the T11 vertebral body is nonspecific but stable from prior imaging suggestive of a benign etiology, attention on follow-up imaging suggested. 4. Stable small bilateral pulmonary nodules, continued attention on follow-up imaging suggested. 5. Large hiatal hernia/intrathoracic stomach also containing a portion of the pancreatic body. 6. Prior prostatectomy without suspicious enhancing nodularity in the prostatectomy bed.   07/15/2022 Procedure   L2 vertebral body CT guided biopsy showed positive for malignancy, metastatic carcinoma,  morphologically compatible with known clear cell renal cell carcinoma.     07/19/2022 -  Chemotherapy   RENAL CELL Pembrolizumab (200)       INTERVAL HISTORY Peter Stuart is a 77 y.o. male who has above history reviewed by me today presents for follow up visit for metastatic RCC Left hip pain and back pain, has started SBRT to left femur, as well as L2 He feels left thigh pain is worse, 4-5 out of 10. He takes Tylenol '650mg'$  Q6 hours as needed for pain, symptom is manageable.   Review of Systems  Constitutional:  Negative for appetite change, chills, fever and unexpected weight change.  HENT:   Negative for hearing loss and voice change.   Eyes:  Negative for eye problems and icterus.  Respiratory:  Negative for chest tightness, cough and shortness of breath.   Cardiovascular:  Negative for chest pain and leg swelling.  Gastrointestinal:  Negative for abdominal distention and abdominal pain.  Endocrine: Negative for hot flashes.  Genitourinary:  Negative for difficulty urinating, dysuria and frequency.   Musculoskeletal:  Positive for back pain. Negative for arthralgias.       Left hip pain  Skin:  Negative for itching.  Neurological:  Negative for light-headedness and numbness.  Hematological:  Negative for adenopathy. Does not bruise/bleed easily.  Psychiatric/Behavioral:  Negative for confusion.     MEDICAL HISTORY:  Past Medical History:  Diagnosis Date   Anemia    Cancer ([place])    Complication of anesthesia    slow to wake after 1 surgery   CPAP use counseling    HTN (hypertension)    OSA on CPAP    Prostate cancer (HCC)    Seasonal allergies    Skin cancer     SURGICAL HISTORY: Past Surgical History:  Procedure Laterality Date   HERNIA REPAIR     IR FLUORO GUIDED NEEDLE PLC ASPIRATION/INJECTION LOC  07/02/2022   LAPAROSCOPIC APPENDECTOMY N/A 04/27/2021   Procedure: APPENDECTOMY LAPAROSCOPIC;  Surgeon: Fredirick Maudlin, MD;  Location: [facility]S;  Service: General;  Laterality: N/A;   LAPAROSCOPIC NEPHRECTOMY, HAND ASSISTED Left 06/25/2021    Procedure: HAND ASSISTED LAPAROSCOPIC NEPHRECTOMY;  Surgeon: Billey Co, MD;  Location: [facility] OR[facility];  Service: Urology;  Laterality: Left;   prostatectomy     REPAIR KNEE LIGAMENT     SHOULDER ARTHROSCOPY WITH SUBACROMIAL DECOMPRESSION AND OPEN ROTATOR C Right 12/29/2020   Procedure: Right shoulder arthroscopic rotator cuff repair, subacromial decompression, and biceps tenodesis;  Surgeon: Leim Fabry, MD;  Location: [place];  Service: Orthopedics;  Laterality: Right;   TONSILLECTOMY     VASECTOMY      SOCIAL HISTORY: Social History   Socioeconomic History   Marital status: Widowed    Spouse name: Not on file   Number of children: Not on file   Years of education: Not on file   Highest education level: Not on file  Occupational History   Not on file  Tobacco Use   Smoking status: Never    Passive exposure: Never   Smokeless tobacco: Never  Vaping Use   Vaping Use: Never used  Substance and Sexual Activity   Alcohol use: Not Currently   Drug use: Never   Sexual activity: Not Currently  Other Topics Concern   Not on file  Social History Narrative   Not on file   Social Determinants of Health   Financial Resource Strain: Not on file  Food Insecurity: Not on file  Transportation Needs:  Not on file  Physical Activity: Not on file  Stress: Not on file  Social Connections: Not on file  Intimate Partner Violence: Not on file    FAMILY HISTORY: Family History  Problem Relation Age of Onset   Stroke Mother    Hypertension Father    Emphysema Father     ALLERGIES:  has No Known Allergies.  MEDICATIONS:  Current Outpatient Medications  Medication Sig Dispense Refill   acetaminophen (TYLENOL) 500 MG tablet Take by mouth.     ALLEGRA ALLERGY 60 MG tablet Take 60 mg by mouth daily.     cholecalciferol (VITAMIN D3) 25 MCG (1000 UNIT) tablet Take 1,000 Units by mouth daily.     clotrimazole (LOTRIMIN) 1 % cream Apply topically 2 (two) times daily.      Coenzyme Q10 10 MG capsule Take 10 mg by mouth daily.     ezetimibe (ZETIA) 10 MG tablet Take 1 tablet by mouth daily.     fluticasone (FLONASE) 50 MCG/ACT nasal spray Place into the nose.     gabapentin (NEURONTIN) 400 MG capsule Take 600 mg by mouth at bedtime.     GINKGO BILOBA COMPLEX PO Take by mouth.     hydrochlorothiazide (HYDRODIURIL) 25 MG tablet Take 1 tablet by mouth daily.     losartan (COZAAR) 100 MG tablet Take 100 mg by mouth daily.     Multiple Vitamin (MULTIVITAMIN) capsule Take 1 capsule by mouth daily.     multivitamin-lutein (OCUVITE-LUTEIN) CAPS capsule Take 1 capsule by mouth daily.     Omega-3 Fatty Acids (FISH OIL) 1000 MG CAPS Take 1 capsule by mouth 1 day or 1 dose.     oxyCODONE (OXY IR/ROXICODONE) 5 MG immediate release tablet Take 1 tablet (5 mg total) by mouth every 6 (six) hours as needed for severe pain. 20 tablet 0   Pembrolizumab (KEYTRUDA IV) Inject into the vein.     terbinafine (LAMISIL) 1 % cream Apply 1 application topically daily as needed (irritation).     tiZANidine (ZANAFLEX) 2 MG tablet Take 1 tablet (2 mg total) by mouth every 8 (eight) hours as needed for muscle spasms. 30 tablet 0   diphenhydrAMINE-Zinc Acetate (BENADRYL ITCH RELIEF EX) Apply topically. (Patient not taking: Reported on 11/04/2022)     No current facility-administered medications for this visit.   Facility-Administered Medications Ordered in Other Visits  Medication Dose Route Frequency Provider Last Rate Last Admin   pembrolizumab (KEYTRUDA) 200 mg in sodium chloride 0.9 % 50 mL chemo infusion  200 mg Intravenous Once [name], MD 116 mL/hr at 11/04/22 1054 200 mg at 11/04/22 1054     PHYSICAL EXAMINATION: ECOG PERFORMANCE STATUS: 1 - Symptomatic but completely ambulatory Vitals:   11/04/22 0924 11/04/22 0932  BP:  110/74  Pulse:  65  Resp: 18 18  Temp:  98.5 F (36.9 C)  SpO2:  98%   Filed Weights   11/04/22 0921  Weight: 239 lb (108.4 kg)    Physical  Exam Constitutional:      General: He is not in acute distress.    Appearance: He is obese.  HENT:     Head: Normocephalic and atraumatic.  Eyes:     General: No scleral icterus. Cardiovascular:     Rate and Rhythm: Normal rate.     Heart sounds: Normal heart sounds.  Pulmonary:     Effort: Pulmonary effort is normal. No respiratory distress.     Breath sounds: No wheezing.  Abdominal:  General: Bowel sounds are normal. There is no distension.     Palpations: Abdomen is soft.  Musculoskeletal:        General: No deformity. Normal range of motion.     Cervical back: Normal range of motion and neck supple.  Skin:    General: Skin is warm and dry.     Findings: Rash present. No erythema.     Comments: Scattered rash on his shoulder, neck and back  Neurological:     Mental Status: He is alert and oriented to person, place, and time. Mental status is at baseline.     Cranial Nerves: No cranial nerve deficit.     Coordination: Coordination normal.  Psychiatric:        Mood and Affect: Mood normal.     LABORATORY DATA:  I have reviewed the data as listed    Latest Ref Rng & Units 11/04/2022    9:05 AM 10/14/2022   12:54 PM 09/23/2022    2:02 PM  CBC  WBC 4.0 - 10.5 K/uL 6.3  8.4  7.2   Hemoglobin 13.0 - 17.0 g/dL 14.1  14.9  14.4   Hematocrit 39.0 - 52.0 % 40.8  42.9  42.1   Platelets 150 - 400 K/uL 198  207  211       Latest Ref Rng & Units 11/04/2022    9:05 AM 10/14/2022   12:54 PM 09/23/2022    2:02 PM  CMP  Glucose 70 - 99 mg/dL 97  194  108   BUN 8 - 23 mg/dL 30  31  35   Creatinine 0.61 - 1.24 mg/dL 1.81  1.83  1.88   Sodium 135 - 145 mmol/L 136  137  135   Potassium 3.5 - 5.1 mmol/L 3.7  4.1  4.1   Chloride 98 - 111 mmol/L 102  98  101   CO2 22 - 32 mmol/L '27  27  26   '$ Calcium 8.9 - 10.3 mg/dL 9.0  9.2  9.1   Total Protein 6.5 - 8.1 g/dL 6.3  6.9  7.1   Total Bilirubin 0.3 - 1.2 mg/dL 0.6  0.4  0.4   Alkaline Phos 38 - 126 U/L 46  43  44   AST 15 - 41  U/L '22  24  23   '$ ALT 0 - 44 U/L 27  33  40     RADIOGRAPHIC STUDIES: I have personally reviewed the radiological images as listed and agreed with the findings in the report. MR THORACIC SPINE WO CONTRAST  Result Date: 10/20/2022 CLINICAL DATA:  Mid back pain, history of prostate cancer EXAM: MRI THORACIC SPINE WITHOUT CONTRAST TECHNIQUE: Multiplanar, multisequence MR imaging of the thoracic spine was performed. No intravenous contrast was administered. COMPARISON:  None Available. FINDINGS: Alignment:  Exaggerated thoracic kyphosis.  Negative for listhesis Vertebrae: Known L2 metastatic lesion only seen on counting sequence. Associated inferior endplate concavity is stable from CT 05/29/2022. Accounting for T9 inferior endplate Schmorl's node and T11 hemangioma, no bone lesion is seen throughout the thoracic spine. Also no acute fracture or degenerative inflammation. Cord:  Normal signal and morphology. Paraspinal and other soft tissues: Trace pleural fluid on the right without visible cause. Disc levels: Ordinary generalized thoracic disc space narrowing. No significant facet spurring. No neural impingement. IMPRESSION: 1. No acute finding, impingement, or metastatic focus in the thoracic spine. 2. Known L2 vertebral body metastasis. Electronically Signed   By: Jorje Guild M.D.   On: 10/20/2022  12:58   DG Thoracic Spine 2 View  Result Date: 10/20/2022 CLINICAL DATA:  Pain.  Renal cell cancer with spine mets. EXAM: THORACIC SPINE 3 VIEWS COMPARISON:  None Available. FINDINGS: Multifocal degenerative changes with disc space narrowing and marginal osteophytes. No compression deformities or spondylolisthesis. No focal osteolytic or osteoblastic changes. IMPRESSION: 1. Degenerative changes. 2. No acute osseous abnormalities identified. 3. Given the patient's history, MRI of the thoracic spine should be considered to exclude neoplasm. Electronically Signed   By: Sammie Bench M.D.   On: 10/20/2022  11:23

## 2022-11-04 NOTE — Assessment & Plan Note (Signed)
Encourage oral hydration, avoid nephrotoxins 

## 2022-11-04 NOTE — Assessment & Plan Note (Signed)
Left thigh pain is slightly worse, manageable with Tylenol '650mg'$  PRN 4h

## 2022-11-05 ENCOUNTER — Ambulatory Visit
Admission: RE | Admit: 2022-11-05 | Discharge: 2022-11-05 | Disposition: A | Discharge status: Home / Self Care | Payer: Medicare Other | Source: Ambulatory Visit | Attending: Radiation Oncology | Admitting: Radiation Oncology

## 2022-11-05 ENCOUNTER — Other Ambulatory Visit: Payer: Self-pay

## 2022-11-05 DIAGNOSIS — Z5112 Encounter for antineoplastic immunotherapy: Secondary | ICD-10-CM | POA: Diagnosis not present

## 2022-11-05 LAB — RAD ONC ARIA SESSION SUMMARY
Course Elapsed Days: 6
Plan Fractions Treated to Date: 5
Plan Prescribed Dose Per Fraction: 6 Gy
Plan Total Fractions Prescribed: 5
Plan Total Prescribed Dose: 30 Gy
Reference Point Dosage Given to Date: 30 Gy
Reference Point Session Dosage Given: 6 Gy
Session Number: 5

## 2022-11-07 DIAGNOSIS — Z5112 Encounter for antineoplastic immunotherapy: Secondary | ICD-10-CM | POA: Diagnosis not present

## 2022-11-08 ENCOUNTER — Encounter: Payer: Self-pay | Admitting: Oncology

## 2022-11-11 ENCOUNTER — Other Ambulatory Visit: Payer: Self-pay | Admitting: Oncology

## 2022-11-11 ENCOUNTER — Telehealth: Payer: Self-pay

## 2022-11-11 MED ORDER — OXYCODONE HCL 5 MG PO TABS: 5.0000 mg | ORAL_TABLET | Freq: Four times a day (QID) | ORAL | 0 refills | Status: DC | PRN

## 2022-11-11 NOTE — Telephone Encounter (Signed)
Pt sent mychart message stating ""Dr. Baruch Gouty said that the provider he referred [place] to refused to take the case. "   New Orthopedics referral has been faxed to [facility].

## 2022-11-12 ENCOUNTER — Ambulatory Visit
Admission: RE | Admit: 2022-11-12 | Discharge: 2022-11-12 | Disposition: A | Discharge status: Home / Self Care | Payer: Medicare Other | Source: Ambulatory Visit | Attending: Radiation Oncology | Admitting: Radiation Oncology

## 2022-11-12 ENCOUNTER — Other Ambulatory Visit: Payer: Self-pay

## 2022-11-12 DIAGNOSIS — Z5112 Encounter for antineoplastic immunotherapy: Secondary | ICD-10-CM | POA: Diagnosis not present

## 2022-11-12 LAB — RAD ONC ARIA SESSION SUMMARY
Course Elapsed Days: 13
Plan Fractions Treated to Date: 1
Plan Prescribed Dose Per Fraction: 6 Gy
Plan Total Fractions Prescribed: 5
Plan Total Prescribed Dose: 30 Gy
Reference Point Dosage Given to Date: 6 Gy
Reference Point Session Dosage Given: 6 Gy
Session Number: 6

## 2022-11-13 ENCOUNTER — Ambulatory Visit
Admission: RE | Admit: 2022-11-13 | Discharge: 2022-11-13 | Disposition: A | Discharge status: Home / Self Care | Payer: Medicare Other | Source: Ambulatory Visit | Attending: Radiation Oncology | Admitting: Radiation Oncology

## 2022-11-13 ENCOUNTER — Other Ambulatory Visit: Payer: Self-pay

## 2022-11-13 DIAGNOSIS — Z5112 Encounter for antineoplastic immunotherapy: Secondary | ICD-10-CM | POA: Diagnosis not present

## 2022-11-13 LAB — RAD ONC ARIA SESSION SUMMARY
Course Elapsed Days: 14
Plan Fractions Treated to Date: 2
Plan Prescribed Dose Per Fraction: 6 Gy
Plan Total Fractions Prescribed: 5
Plan Total Prescribed Dose: 30 Gy
Reference Point Dosage Given to Date: 12 Gy
Reference Point Session Dosage Given: 6 Gy
Session Number: 7

## 2022-11-14 ENCOUNTER — Ambulatory Visit
Admission: RE | Admit: 2022-11-14 | Discharge: 2022-11-14 | Disposition: A | Discharge status: Home / Self Care | Payer: Medicare Other | Source: Ambulatory Visit | Attending: Radiation Oncology | Admitting: Radiation Oncology

## 2022-11-14 ENCOUNTER — Other Ambulatory Visit: Payer: Self-pay

## 2022-11-14 DIAGNOSIS — Z5112 Encounter for antineoplastic immunotherapy: Secondary | ICD-10-CM | POA: Diagnosis not present

## 2022-11-14 LAB — RAD ONC ARIA SESSION SUMMARY
Course Elapsed Days: 15
Plan Fractions Treated to Date: 3
Plan Prescribed Dose Per Fraction: 6 Gy
Plan Total Fractions Prescribed: 5
Plan Total Prescribed Dose: 30 Gy
Reference Point Dosage Given to Date: 18 Gy
Reference Point Session Dosage Given: 6 Gy
Session Number: 8

## 2022-11-15 ENCOUNTER — Ambulatory Visit
Admission: RE | Admit: 2022-11-15 | Discharge: 2022-11-15 | Disposition: A | Discharge status: Home / Self Care | Payer: Medicare Other | Source: Ambulatory Visit | Attending: Radiation Oncology | Admitting: Radiation Oncology

## 2022-11-15 ENCOUNTER — Ambulatory Visit
Admission: RE | Admit: 2022-11-15 | Discharge: 2022-11-15 | Disposition: A | Discharge status: Home / Self Care | Payer: Medicare Other | Source: Ambulatory Visit | Attending: Oncology | Admitting: Oncology

## 2022-11-15 ENCOUNTER — Telehealth: Payer: Self-pay | Admitting: *Deleted

## 2022-11-15 ENCOUNTER — Other Ambulatory Visit: Payer: Self-pay

## 2022-11-15 DIAGNOSIS — C642 Malignant neoplasm of left kidney, except renal pelvis: Secondary | ICD-10-CM | POA: Diagnosis present

## 2022-11-15 DIAGNOSIS — Z5112 Encounter for antineoplastic immunotherapy: Secondary | ICD-10-CM | POA: Diagnosis not present

## 2022-11-15 LAB — RAD ONC ARIA SESSION SUMMARY
Course Elapsed Days: 16
Plan Fractions Treated to Date: 4
Plan Prescribed Dose Per Fraction: 6 Gy
Plan Total Fractions Prescribed: 5
Plan Total Prescribed Dose: 30 Gy
Reference Point Dosage Given to Date: 24 Gy
Reference Point Session Dosage Given: 6 Gy
Session Number: 9

## 2022-11-15 NOTE — Telephone Encounter (Signed)
Called report  IMPRESSION: Increasing lucent bone lesions involving the spine at L2 and left femoral neck. There is bone cortical disruption at L2. In addition the size of the lesion in the left femoral neck is greater than 50% of the diameter of the bone itself. Please correlate for an increased risk of pathologic fracture.   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 No new abnormal soft tissue or lymph node enlargement.   Very large hiatal hernia with majority of the stomach in the mediastinum there is also herniation of a small amount of the midbody of the pancreas. Distal esophagus has some subtle wall thickening. Please correlate with symptoms.   Stable small lung nodules. Recommend continued attention on follow-up.   Results will be called to the ordering service by the radiology assistant team.     Electronically Signed   By: Jill Side M.D.   On: 11/15/2022 14:02

## 2022-11-15 NOTE — Telephone Encounter (Signed)
Pt scheduled at [facility] on 1/24. Confirmed with office.

## 2022-11-18 ENCOUNTER — Other Ambulatory Visit: Payer: Self-pay

## 2022-11-18 ENCOUNTER — Ambulatory Visit
Admission: RE | Admit: 2022-11-18 | Discharge: 2022-11-18 | Disposition: A | Discharge status: Home / Self Care | Payer: Medicare Other | Source: Ambulatory Visit | Attending: Radiation Oncology | Admitting: Radiation Oncology

## 2022-11-18 DIAGNOSIS — Z5112 Encounter for antineoplastic immunotherapy: Secondary | ICD-10-CM | POA: Diagnosis not present

## 2022-11-18 LAB — RAD ONC ARIA SESSION SUMMARY
Course Elapsed Days: 19
Plan Fractions Treated to Date: 5
Plan Prescribed Dose Per Fraction: 6 Gy
Plan Total Fractions Prescribed: 5
Plan Total Prescribed Dose: 30 Gy
Reference Point Dosage Given to Date: 30 Gy
Reference Point Session Dosage Given: 6 Gy
Session Number: 10

## 2022-11-22 ENCOUNTER — Encounter
Admission: RE | Admit: 2022-11-22 | Discharge: 2022-11-22 | Disposition: A | Discharge status: Home / Self Care | Payer: Medicare Other | Source: Ambulatory Visit | Attending: Orthopedic Surgery | Admitting: Orthopedic Surgery

## 2022-11-22 ENCOUNTER — Encounter: Payer: Self-pay | Admitting: Orthopedic Surgery

## 2022-11-22 ENCOUNTER — Encounter: Payer: Self-pay | Admitting: Urgent Care

## 2022-11-22 ENCOUNTER — Other Ambulatory Visit: Payer: Self-pay

## 2022-11-22 ENCOUNTER — Other Ambulatory Visit: Payer: Self-pay | Admitting: Orthopedic Surgery

## 2022-11-22 VITALS — Ht 69.0 in | Wt 233.0 lb

## 2022-11-22 DIAGNOSIS — Z01810 Encounter for preprocedural cardiovascular examination: Secondary | ICD-10-CM

## 2022-11-22 DIAGNOSIS — I10 Essential (primary) hypertension: Secondary | ICD-10-CM | POA: Diagnosis not present

## 2022-11-22 DIAGNOSIS — E1169 Type 2 diabetes mellitus with other specified complication: Secondary | ICD-10-CM | POA: Diagnosis not present

## 2022-11-22 DIAGNOSIS — E782 Mixed hyperlipidemia: Secondary | ICD-10-CM | POA: Diagnosis not present

## 2022-11-22 DIAGNOSIS — Z01818 Encounter for other preprocedural examination: Secondary | ICD-10-CM | POA: Diagnosis present

## 2022-11-22 DIAGNOSIS — E785 Hyperlipidemia, unspecified: Secondary | ICD-10-CM | POA: Insufficient documentation

## 2022-11-22 HISTORY — DX: Chronic kidney disease, stage 3 unspecified: N18.30

## 2022-11-22 HISTORY — DX: Type 2 diabetes mellitus without complications: E11.9

## 2022-11-22 HISTORY — DX: Other specified postprocedural states: Z98.890

## 2022-11-22 HISTORY — DX: Malignant neoplasm of unspecified kidney, except renal pelvis: C64.9

## 2022-11-22 HISTORY — DX: Nausea with vomiting, unspecified: R11.2

## 2022-11-22 HISTORY — DX: Hyperlipidemia, unspecified: E78.5

## 2022-11-22 LAB — URINALYSIS, ROUTINE W REFLEX MICROSCOPIC
Bilirubin Urine: NEGATIVE
Glucose, UA: NEGATIVE mg/dL
Hgb urine dipstick: NEGATIVE
Ketones, ur: NEGATIVE mg/dL
Leukocytes,Ua: NEGATIVE
Nitrite: NEGATIVE
Protein, ur: NEGATIVE mg/dL
Specific Gravity, Urine: 1.012 (ref 1.005–1.030)
pH: 6 (ref 5.0–8.0)

## 2022-11-22 LAB — BASIC METABOLIC PANEL
Anion gap: 12 (ref 5–15)
BUN: 27 mg/dL — ABNORMAL HIGH (ref 8–23)
CO2: 20 mmol/L — ABNORMAL LOW (ref 22–32)
Calcium: 9.3 mg/dL (ref 8.9–10.3)
Chloride: 103 mmol/L (ref 98–111)
Creatinine, Ser: 1.41 mg/dL — ABNORMAL HIGH (ref 0.61–1.24)
GFR, Estimated: 51 mL/min — ABNORMAL LOW (ref >60)
Glucose, Bld: 117 mg/dL — ABNORMAL HIGH (ref 70–99)
Potassium: 3.9 mmol/L (ref 3.5–5.1)
Sodium: 135 mmol/L (ref 135–145)

## 2022-11-22 LAB — SURGICAL PCR SCREEN
MRSA, PCR: NEGATIVE
Staphylococcus aureus: NEGATIVE

## 2022-11-22 LAB — CBC
HCT: 40.5 % (ref 39.0–52.0)
Hemoglobin: 14.2 g/dL (ref 13.0–17.0)
MCH: 31.2 pg (ref 26.0–34.0)
MCHC: 35.1 g/dL (ref 30.0–36.0)
MCV: 89 fL (ref 80.0–100.0)
Platelets: 174 10*3/uL (ref 150–400)
RBC: 4.55 MIL/uL (ref 4.22–5.81)
RDW: 13.1 % (ref 11.5–15.5)
WBC: 6.3 10*3/uL (ref 4.0–10.5)
nRBC: 0 % (ref 0.0–0.2)

## 2022-11-22 LAB — TYPE AND SCREEN
ABO/RH(D): B POS
Antibody Screen: NEGATIVE

## 2022-11-22 NOTE — H&P (Deleted)
  The note originally documented on this encounter has been moved the the encounter in which it belongs.  

## 2022-11-22 NOTE — Patient Instructions (Signed)
Your procedure is scheduled on: 11/26/22 Report to [facility]. To find out your arrival time please call [phone] between 1PM - 3PM on 11/25/22.  Remember: Instructions that are not followed completely may result in serious medical risk, up to and including death, or upon the discretion of your surgeon and anesthesiologist your surgery may need to be rescheduled.     _X__ 1. Do not eat food or drink any liquids after midnight the night before your procedure.                 No gum chewing or hard candies.   __X__2.  On the morning of surgery brush your teeth with toothpaste and water, you                 may rinse your mouth with mouthwash if you wish.  Do not swallow any              toothpaste of mouthwash.     _X__ 3.  No Alcohol for 24 hours before or after surgery.   _X__ 4.  Do Not Smoke or use e-cigarettes For 24 Hours Prior to Your Surgery.                 Do not use any chewable tobacco products for at least 6 hours prior to                 surgery.  ____  5.  Bring all medications with you on the day of surgery if instructed.   __X__  6.  Notify your doctor if there is any change in your medical condition      (cold, fever, infections).     Do not wear jewelry, make-up, hairpins, clips or nail polish. Do not wear lotions, powders, or perfumes.  Do not shave body hair 48 hours prior to surgery. Men may shave face and neck. Do not bring valuables to the hospital.    [facility] is not responsible for any belongings or valuables.  Contacts, dentures/partials or body piercings may not be worn into surgery. Bring a case for your contacts, glasses or hearing aids, a denture cup will be supplied. Leave your suitcase in the car. After surgery it may be brought to your room. For patients admitted to the hospital, discharge time is determined by your treatment team.   Patients discharged the day of surgery will need an adult  driver. You will not be allowed to drive yourself home, take an uber, lyft or taxi. You may use medical transportation such as [facility] as long as you have an adult with you or waiting to assist you upon your arrival home. You must have an adult over 13 years old in the home with you for the first 24 hours after surgery   Please read over the following fact sheets that you were given:   MRSA Information, Sage Wipes, Chiropodist  __X__ Take these medicines the morning of surgery with A SIP OF WATER:    1. ezetimibe (ZETIA) 10 MG tablet   2.   3.   4.  5.  6.  ____ Fleet Enema (as directed)   __X__ Use CHG Soap/SAGE wipes as directed  ____ Use inhalers on the day of surgery  ____ Stop metformin/Janumet/Farxiga 2 days prior to surgery    ____ Take 1/2 of usual insulin dose the night before surgery. No insulin the morning  of surgery.   ____ Stop Blood Thinners Coumadin/Plavix/Xarelto/Pleta/Pradaxa/Eliquis/Effient/Aspirin  on   Or contact your Surgeon, Cardiologist or Medical Doctor regarding  ability to stop your blood thinners  __X__ Stop Anti-inflammatories 7 days before surgery such as Advil, Ibuprofen, Motrin,  BC or Goodies Powder, Naprosyn, Naproxen, Aleve, Aspirin    __X__ Stop all herbals and supplements, fish oil or vitamins  until after surgery.    ____ Bring C-Pap to the hospital.    Preparing the Skin Before Surgery     To help prevent the risk of infection at your surgical site, we are now providing you with rinse-free Sage 2% Chlorhexidine Gluconate (CHG) disposable wipes.  Chlorhexidine Gluconate (CHG) Soap  o An antiseptic cleaner that kills germs and bonds with the skin to continue killing germs even after washing  o Used for showering the night before surgery and morning of surgery  The night before surgery: Shower or bathe with warm water. Do not apply perfume, lotions, powders. Wait one hour after shower. Skin should be dry  and cool. Open Sage wipe package - 6 disposable cloths are inside. Wipe body using one cloth for the right arm, one cloth for the left arm, one cloth for the right leg, one cloth for the left leg, one cloth for the chest/abdomen area (do not use on breasts if breast feeding), and one cloth for the back. 5. Do not rinse, allow to dry. 6. Skin may fee "tacky" for several minutes. 7. Dress in clean clothes. 8. Place clean sheets on your bed and do not sleep with pets.  REPEAT ABOVE ON THE MORNING OF SURGERY PRIOR TO ARRIVING TO [facility].

## 2022-11-22 NOTE — H&P (Signed)
  The note originally documented on this encounter has been moved the the encounter in which it belongs.    at risk for pathologic fracture. IMPRESSION: Increasing lucent bone lesions involving the spine at L2 and left femoral neck. There is bone cortical disruption at L2. In addition the size of the lesion in the left femoral neck is greater than 50% of the diameter of the bone itself. Please correlate for an increased risk of pathologic fracture. 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 No new abnormal soft tissue or lymph node enlargement. Very large hiatal hernia with majority of the stomach in the mediastinum there is also herniation of a small amount of the midbody of the pancreas. Distal esophagus has some subtle wall thickening. Please correlate with symptoms. Stable small lung nodules. Recommend continued attention on follow-up. Results will be called to the ordering service by the radiology assistant team. Electronically Signed   By: Jill Side M.D.   On: 11/15/2022 14:02    ASSESSMENT:  Left hip fracture        Active Problems:   * No active hospital problems. *    PLAN: Left femoral IM nail  Carlynn Spry 11/22/2022. 6:22 AM

## 2022-11-25 ENCOUNTER — Inpatient Hospital Stay: Payer: Medicare Other

## 2022-11-25 ENCOUNTER — Inpatient Hospital Stay (HOSPITAL_BASED_OUTPATIENT_CLINIC_OR_DEPARTMENT_OTHER): Payer: Medicare Other | Admitting: Oncology

## 2022-11-25 ENCOUNTER — Encounter: Payer: Self-pay | Admitting: Oncology

## 2022-11-25 VITALS — BP 100/70 | HR 68 | Temp 97.4°F | Resp 18 | Ht 69.0 in | Wt 230.0 lb

## 2022-11-25 DIAGNOSIS — C642 Malignant neoplasm of left kidney, except renal pelvis: Secondary | ICD-10-CM

## 2022-11-25 DIAGNOSIS — G893 Neoplasm related pain (acute) (chronic): Secondary | ICD-10-CM

## 2022-11-25 DIAGNOSIS — Z5112 Encounter for antineoplastic immunotherapy: Secondary | ICD-10-CM | POA: Insufficient documentation

## 2022-11-25 DIAGNOSIS — C61 Malignant neoplasm of prostate: Secondary | ICD-10-CM

## 2022-11-25 DIAGNOSIS — Z79899 Other long term (current) drug therapy: Secondary | ICD-10-CM | POA: Diagnosis not present

## 2022-11-25 DIAGNOSIS — C7951 Secondary malignant neoplasm of bone: Secondary | ICD-10-CM

## 2022-11-25 DIAGNOSIS — N1832 Chronic kidney disease, stage 3b: Secondary | ICD-10-CM

## 2022-11-25 LAB — CBC WITH DIFFERENTIAL/PLATELET
Abs Immature Granulocytes: 0.02 10*3/uL (ref 0.00–0.07)
Basophils Absolute: 0.1 10*3/uL (ref 0.0–0.1)
Basophils Relative: 1 %
Eosinophils Absolute: 0.5 10*3/uL (ref 0.0–0.5)
Eosinophils Relative: 10 %
HCT: 40.8 % (ref 39.0–52.0)
Hemoglobin: 13.9 g/dL (ref 13.0–17.0)
Immature Granulocytes: 0 %
Lymphocytes Relative: 12 %
Lymphs Abs: 0.6 10*3/uL — ABNORMAL LOW (ref 0.7–4.0)
MCH: 30.9 pg (ref 26.0–34.0)
MCHC: 34.1 g/dL (ref 30.0–36.0)
MCV: 90.7 fL (ref 80.0–100.0)
Monocytes Absolute: 0.5 10*3/uL (ref 0.1–1.0)
Monocytes Relative: 11 %
Neutro Abs: 3.4 10*3/uL (ref 1.7–7.7)
Neutrophils Relative %: 66 %
Platelets: 154 10*3/uL (ref 150–400)
RBC: 4.5 MIL/uL (ref 4.22–5.81)
RDW: 13.1 % (ref 11.5–15.5)
WBC: 5 10*3/uL (ref 4.0–10.5)
nRBC: 0 % (ref 0.0–0.2)

## 2022-11-25 LAB — COMPREHENSIVE METABOLIC PANEL
ALT: 33 U/L (ref 0–44)
AST: 23 U/L (ref 15–41)
Albumin: 4.3 g/dL (ref 3.5–5.0)
Alkaline Phosphatase: 38 U/L (ref 38–126)
Anion gap: 11 (ref 5–15)
BUN: 27 mg/dL — ABNORMAL HIGH (ref 8–23)
CO2: 25 mmol/L (ref 22–32)
Calcium: 9 mg/dL (ref 8.9–10.3)
Chloride: 99 mmol/L (ref 98–111)
Creatinine, Ser: 1.45 mg/dL — ABNORMAL HIGH (ref 0.61–1.24)
GFR, Estimated: 49 mL/min — ABNORMAL LOW (ref >60)
Glucose, Bld: 143 mg/dL — ABNORMAL HIGH (ref 70–99)
Potassium: 3.7 mmol/L (ref 3.5–5.1)
Sodium: 135 mmol/L (ref 135–145)
Total Bilirubin: 0.4 mg/dL (ref 0.3–1.2)
Total Protein: 6.7 g/dL (ref 6.5–8.1)

## 2022-11-25 LAB — T4, FREE: Free T4: 0.94 ng/dL (ref 0.61–1.12)

## 2022-11-25 LAB — TSH: TSH: 0.943 u[IU]/mL (ref 0.350–4.500)

## 2022-11-25 MED ORDER — SODIUM CHLORIDE 0.9 % IV SOLN
200.0000 mg | Freq: Once | INTRAVENOUS | Status: AC
  Administered 2022-11-25: 200 mg via INTRAVENOUS
  Filled 2022-11-25: qty 200

## 2022-11-25 MED ORDER — SODIUM CHLORIDE 0.9 % IV SOLN
Freq: Once | INTRAVENOUS | Status: AC
  Filled 2022-11-25: qty 250

## 2022-11-25 NOTE — Assessment & Plan Note (Signed)
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 Biopsy of L2 + metastatic carcinoma, stage IV, limited disease burden.  Currently on palliative immunotherapy Labs are reviewed and discussed with patient. Proceed with Keytruda.  Jan 2024 CT scan showed stable disease, except increased bone lesions L2 and left femoral neck.

## 2022-11-25 NOTE — Patient Instructions (Signed)
[facility]  Discharge Instructions: Thank you for choosing [facility] to provide your oncology and hematology care.  If you have a lab appointment with the [facility], please go directly to the [facility] and check in at the registration area.  Wear comfortable clothing and clothing appropriate for easy access to any Portacath or PICC line.   We strive to give you quality time with your provider. You may need to reschedule your appointment if you arrive late (15 or more minutes).  Arriving late affects you and other patients whose appointments are after yours.  Also, if you miss three or more appointments without notifying the office, you may be dismissed from the clinic at the provider's discretion.      For prescription refill requests, have your pharmacy contact our office and allow 72 hours for refills to be completed.    Today you received the following chemotherapy and/or immunotherapy agents KEYTRUDA      To help prevent nausea and vomiting after your treatment, we encourage you to take your nausea medication as directed.  BELOW ARE SYMPTOMS THAT SHOULD BE REPORTED IMMEDIATELY: *FEVER GREATER THAN 100.4 F (38 C) OR HIGHER *CHILLS OR SWEATING *NAUSEA AND VOMITING THAT IS NOT CONTROLLED WITH YOUR NAUSEA MEDICATION *UNUSUAL SHORTNESS OF BREATH *UNUSUAL BRUISING OR BLEEDING *URINARY PROBLEMS (pain or burning when urinating, or frequent urination) *BOWEL PROBLEMS (unusual diarrhea, constipation, pain near the anus) TENDERNESS IN MOUTH AND THROAT WITH OR WITHOUT PRESENCE OF ULCERS (sore throat, sores in mouth, or a toothache) UNUSUAL RASH, SWELLING OR PAIN  UNUSUAL VAGINAL DISCHARGE OR ITCHING   Items with * indicate a potential emergency and should be followed up as soon as possible or go to the Emergency Department if any problems should occur.  Please show the CHEMOTHERAPY ALERT CARD or IMMUNOTHERAPY ALERT CARD at check-in to  the Emergency Department and triage nurse.  Should you have questions after your visit or need to cancel or reschedule your appointment, please contact [facility]  [phone] and follow the prompts.  Office hours are 8:00 a.m. to 4:30 p.m. Monday - Friday. Please note that voicemails left after 4:00 p.m. may not be returned until the following business day.  We are closed weekends and major holidays. You have access to a nurse at all times for urgent questions. Please call the main number to the clinic [phone] and follow the prompts.  For any non-urgent questions, you may also contact your provider using MyChart. We now offer e-Visits for anyone 62 and older to request care online for non-urgent symptoms. For details visit [email or web address].   Also download the MyChart app! Go to the app store, search "MyChart", open the app, select [facility], and log in with your MyChart username and password.  Pembrolizumab Injection What is this medication? PEMBROLIZUMAB (PEM broe LIZ ue mab) treats some types of cancer. It works by helping your immune system slow or stop the spread of cancer cells. It is a monoclonal antibody. This medicine may be used for other purposes; ask your health care provider or pharmacist if you have questions. COMMON BRAND NAME(S): Keytruda What should I tell my care team before I take this medication? They need to know if you have any of these conditions: Allogeneic stem cell transplant (uses someone else's stem cells) Autoimmune diseases, such as Crohn disease, ulcerative colitis, lupus History of chest radiation Nervous system problems, such as Guillain-Barre syndrome, myasthenia gravis  Organ transplant An unusual or allergic reaction to pembrolizumab, other medications, foods, dyes, or preservatives Pregnant or trying to get pregnant Breast-feeding How should I use this medication? This medication is injected into a vein. It  is given by your care team in a hospital or clinic setting. A special MedGuide will be given to you before each treatment. Be sure to read this information carefully each time. Talk to your care team about the use of this medication in children. While it may be prescribed for children as young as 6 months for selected conditions, precautions do apply. Overdosage: If you think you have taken too much of this medicine contact a poison control center or emergency room at once. NOTE: This medicine is only for you. Do not share this medicine with others. What if I miss a dose? Keep appointments for follow-up doses. It is important not to miss your dose. Call your care team if you are unable to keep an appointment. What may interact with this medication? Interactions have not been studied. This list may not describe all possible interactions. Give your health care provider a list of all the medicines, herbs, non-prescription drugs, or dietary supplements you use. Also tell them if you smoke, drink alcohol, or use illegal drugs. Some items may interact with your medicine. What should I watch for while using this medication? Your condition will be monitored carefully while you are receiving this medication. You may need blood work while taking this medication. This medication may cause serious skin reactions. They can happen weeks to months after starting the medication. Contact your care team right away if you notice fevers or flu-like symptoms with a rash. The rash may be red or purple and then turn into blisters or peeling of the skin. You may also notice a red rash with swelling of the face, lips, or lymph nodes in your neck or under your arms. Tell your care team right away if you have any change in your eyesight. Talk to your care team if you may be pregnant. Serious birth defects can occur if you take this medication during pregnancy and for 4 months after the last dose. You will need a negative  pregnancy test before starting this medication. Contraception is recommended while taking this medication and for 4 months after the last dose. Your care team can help you find the option that works for you. Do not breastfeed while taking this medication and for 4 months after the last dose. What side effects may I notice from receiving this medication? Side effects that you should report to your care team as soon as possible: Allergic reactions--skin rash, itching, hives, swelling of the face, lips, tongue, or throat Dry cough, shortness of breath or trouble breathing Eye pain, redness, irritation, or discharge with blurry or decreased vision Heart muscle inflammation--unusual weakness or fatigue, shortness of breath, chest pain, fast or irregular heartbeat, dizziness, swelling of the ankles, feet, or hands Hormone gland problems--headache, sensitivity to light, unusual weakness or fatigue, dizziness, fast or irregular heartbeat, increased sensitivity to cold or heat, excessive sweating, constipation, hair loss, increased thirst or amount of urine, tremors or shaking, irritability Infusion reactions--chest pain, shortness of breath or trouble breathing, feeling faint or lightheaded Kidney injury (glomerulonephritis)--decrease in the amount of urine, red or dark brown urine, foamy or bubbly urine, swelling of the ankles, hands, or feet Liver injury--right upper belly pain, loss of appetite, nausea, light-colored stool, dark yellow or brown urine, yellowing skin or eyes, unusual weakness  weakness or fatigue Pain, tingling, or numbness in the hands or feet, muscle weakness, change in vision, confusion or trouble speaking, loss of balance or coordination, trouble walking, seizures Rash, fever, and swollen lymph nodes Redness, blistering, peeling, or loosening of the skin, including inside the mouth Sudden or severe stomach pain, bloody diarrhea, fever, nausea, vomiting Side effects that usually do not require  medical attention (report to your care team if they continue or are bothersome): Bone, joint, or muscle pain Diarrhea Fatigue Loss of appetite Nausea Skin rash This list may not describe all possible side effects. Call your doctor for medical advice about side effects. You may report side effects to FDA at 1-800-FDA-1088. Where should I keep my medication? This medication is given in a hospital or clinic. It will not be stored at home. NOTE: This sheet is a summary. It may not cover all possible information. If you have questions about this medicine, talk to your doctor, pharmacist, or health care provider.  2023 Elsevier/Gold Standard (2013-07-05 00:00:00)   

## 2022-11-25 NOTE — Assessment & Plan Note (Signed)
Encourage oral hydration, avoid nephrotoxins 

## 2022-11-25 NOTE — Assessment & Plan Note (Signed)
S/p  SBRT to L2 and left femur.  Follow up with orthopedic surgeon - he has planned  Left IM nail femur surgery.  From oncology aspect, ok to proceed with surgery.

## 2022-11-25 NOTE — Assessment & Plan Note (Signed)
PSA is less than 0.01.

## 2022-11-25 NOTE — Progress Notes (Signed)
Hematology/Oncology Progress note [phone] [phone]      Patient Care Team: Dion Body, MD as PCP - General (Family Medicine)  ASSESSMENT & PLAN:   Cancer Staging  Cancer of kidney, left [facility]) Staging form: Kidney, AJCC 8th Edition - Clinical stage from 06/25/2021: Stage III (cT3a, cNX, cM0) - Signed by [name], MD on 08/06/2021   Cancer of kidney, left [facility]) History of stage III left RCC, Lytic bone lesion of L2 and intertrochanteric left femur, consistent with osseous metastasis. Biopsy of L2 + metastatic carcinoma, stage IV, limited disease burden.  Currently on palliative immunotherapy Labs are reviewed and discussed with patient. Proceed with Keytruda.  Jan 2024 CT scan showed stable disease, except increased bone lesions L2 and left femoral neck.  Metastasis to bone [facility]) S/p  SBRT to L2 and left femur.  Follow up with orthopedic surgeon - he has planned  Left IM nail femur surgery.  From oncology aspect, ok to proceed with surgery.   Neoplasm related pain continue Tylenol '650mg'$  PRN 4h, he has oxycodone Rx if pain is not controlled with Tylenol   Prostate cancer ([place]) PSA is less than 0.01.    CKD (chronic kidney disease) stage 3, GFR 30-59 ml/min (HCC) Encourage oral hydration, avoid nephrotoxins   No orders of the defined types were placed in this encounter.  Follow up 3 weeks lab [facility] All questions were answered. The patient knows to call the clinic with any problems, questions or concerns.  [name], MD, PhD [facility] Hematology Oncology 11/25/2022   CHIEF COMPLAINTS/REASON FOR VISIT:  RCC  HISTORY OF PRESENTING ILLNESS:   Peter Stuart is a  77 y.o.  male presents for follow-up of history of left RCC and history of prostate cancer. Oncology history summary listed as below. Oncology History  Cancer of kidney, left ([place])  2009 Initial Diagnosis   History of prostate cancer treated with robotic prostatectomy  initial   04/27/2021 Imaging   CT abdomen pelvis showed acute appendicitis without evidence of perforation or abscess.  Large left renal mass consistent with renal malignancy with renal vein invasion.  Focal annular thickening of descending colon.  05/01/2021 CT chest with contrast showed large sliding-type hiatal hernia.  Mild bilateral posterior bibasilar subsegmental atelectasis   06/25/2021 Initial Diagnosis   Cancer of kidney, left  06/25/2021, patient underwent radical nephrectomy by Dr. Diamantina Providence. Pathology showed clear-cell renal cell carcinoma, nuclear grade 2.  Benign adrenal tissue.  Lymphovascular invasion present. pT3a pNx    06/25/2021 Cancer Staging   Staging form: Kidney, AJCC 8th Edition - Clinical stage from 06/25/2021: Stage III (cT3a, cNX, cM0) - Signed by [name], MD on 08/06/2021 Stage prefix: Initial diagnosis   05/29/2022 Imaging   CT chest abdomen pelvis with contrast showed -1. New lytic lesions in the L2 vertebral body and intertrochanteric left femur are most consistent with osseous metastatic disease. 2. Prior left nephrectomy with unchanged size of the soft tissue and fluid collection in the nephrectomy bed favored to reflect postsurgical change given stability. Continued attention on follow-up imaging suggested. 3. Small lucent focus in the T11 vertebral body is nonspecific but stable from prior imaging suggestive of a benign etiology, attention on follow-up imaging suggested. 4. Stable small bilateral pulmonary nodules, continued attention on follow-up imaging suggested. 5. Large hiatal hernia/intrathoracic stomach also containing a portion of the pancreatic body. 6. Prior prostatectomy without suspicious enhancing nodularity in the prostatectomy bed.   07/15/2022 Procedure   L2 vertebral body CT  guided biopsy showed positive for malignancy, metastatic carcinoma, morphologically compatible with known clear cell renal cell carcinoma.    07/19/2022 -  Chemotherapy    RENAL CELL Pembrolizumab (200)       INTERVAL HISTORY Peter Stuart is a 77 y.o. male who has above history reviewed by me today presents for follow up visit for metastatic RCC Left hip pain and back pain, has started SBRT to left femur, as well as L2 Right left thigh pain is stable. He takes Tylenol '650mg'$  Q6 hours as needed for pain, has not utilized oxycodone for a few days.  He is on HTN medication, BP is borderline today. Denies lightheaded.   Review of Systems  Constitutional:  Negative for appetite change, chills, fever and unexpected weight change.  HENT:   Negative for hearing loss and voice change.   Eyes:  Negative for eye problems and icterus.  Respiratory:  Negative for chest tightness, cough and shortness of breath.   Cardiovascular:  Negative for chest pain and leg swelling.  Gastrointestinal:  Negative for abdominal distention and abdominal pain.  Endocrine: Negative for hot flashes.  Genitourinary:  Negative for difficulty urinating, dysuria and frequency.   Musculoskeletal:  Positive for back pain. Negative for arthralgias.       Left hip pain  Skin:  Negative for itching.  Neurological:  Negative for light-headedness and numbness.  Hematological:  Negative for adenopathy. Does not bruise/bleed easily.  Psychiatric/Behavioral:  Negative for confusion.     MEDICAL HISTORY:  Past Medical History:  Diagnosis Date   Anemia    Cancer ([place])    Cancer of kidney ([place])    CKD (chronic kidney disease) stage 3, GFR 30-59 ml/min (HCC)    Complication of anesthesia    slow to wake after 1 surgery   CPAP use counseling    Diabetes mellitus without complication (HCC)    HLD (hyperlipidemia)    HTN (hypertension)    OSA on CPAP    PONV (postoperative nausea and vomiting)    Prostate cancer ([place])    Seasonal allergies    Skin cancer     SURGICAL HISTORY: Past Surgical History:  Procedure Laterality Date   HERNIA REPAIR     IR FLUORO GUIDED NEEDLE PLC  ASPIRATION/INJECTION LOC  07/02/2022   LAPAROSCOPIC APPENDECTOMY N/A 04/27/2021   Procedure: APPENDECTOMY LAPAROSCOPIC;  Surgeon: Fredirick Maudlin, MD;  Location: [facility] ORS;  Service: General;  Laterality: N/A;   LAPAROSCOPIC NEPHRECTOMY, HAND ASSISTED Left 06/25/2021   Procedure: HAND ASSISTED LAPAROSCOPIC NEPHRECTOMY;  Surgeon: Billey Co, MD;  Location: [facility] ORS;  Service: Urology;  Laterality: Left;   prostatectomy     REPAIR KNEE LIGAMENT     SHOULDER ARTHROSCOPY WITH SUBACROMIAL DECOMPRESSION AND OPEN ROTATOR C Right 12/29/2020   Procedure: Right shoulder arthroscopic rotator cuff repair, subacromial decompression, and biceps tenodesis;  Surgeon: Leim Fabry, MD;  Location: [place];  Service: Orthopedics;  Laterality: Right;   TONSILLECTOMY     VASECTOMY      SOCIAL HISTORY: Social History   Socioeconomic History   Marital status: Widowed    Spouse name: Not on file   Number of children: Not on file   Years of education: Not on file   Highest education level: Not on file  Occupational History   Not on file  Tobacco Use   Smoking status: Never    Passive exposure: Never   Smokeless tobacco: Never  Vaping Use   Vaping Use: Never used  Substance  and Sexual Activity   Alcohol use: Not Currently   Drug use: Never   Sexual activity: Not Currently  Other Topics Concern   Not on file  Social History Narrative   Not on file   Social Determinants of Health   Financial Resource Strain: Not on file  Food Insecurity: Not on file  Transportation Needs: Not on file  Physical Activity: Not on file  Stress: Not on file  Social Connections: Not on file  Intimate Partner Violence: Not on file    FAMILY HISTORY: Family History  Problem Relation Age of Onset   Stroke Mother    Hypertension Father    Emphysema Father     ALLERGIES:  has No Known Allergies.  MEDICATIONS:  Current Outpatient Medications  Medication Sig Dispense Refill   acetaminophen (TYLENOL)  650 MG suppository Place 650 mg rectally every 6 (six) hours as needed.     ALLEGRA ALLERGY 60 MG tablet Take 60 mg by mouth daily.     cholecalciferol (VITAMIN D3) 25 MCG (1000 UNIT) tablet Take 1,000 Units by mouth daily.     clotrimazole (LOTRIMIN) 1 % cream Apply topically 2 (two) times daily.     Coenzyme Q10 10 MG capsule Take 10 mg by mouth daily.     ezetimibe (ZETIA) 10 MG tablet Take 1 tablet by mouth daily.     fluticasone (FLONASE) 50 MCG/ACT nasal spray Place 2 sprays into both nostrils daily.     gabapentin (NEURONTIN) 600 MG tablet Take 600 mg by mouth at bedtime.     GINKGO BILOBA COMPLEX PO Take 1 tablet by mouth daily.     hydrochlorothiazide (HYDRODIURIL) 25 MG tablet Take 1 tablet by mouth daily.     losartan (COZAAR) 100 MG tablet Take 100 mg by mouth daily.     Multiple Vitamin (MULTIVITAMIN) capsule Take 1 capsule by mouth daily.     multivitamin-lutein (OCUVITE-LUTEIN) CAPS capsule Take 1 capsule by mouth daily.     Omega-3 Fatty Acids (FISH OIL) 1000 MG CAPS Take 1 capsule by mouth 1 day or 1 dose.     Pembrolizumab (KEYTRUDA IV) Inject into the vein.     terbinafine (LAMISIL) 1 % cream Apply 1 application topically daily as needed (irritation).     triamcinolone cream (KENALOG) 0.1 % Apply 1 Application topically 2 (two) times daily.     diphenhydrAMINE-Zinc Acetate (BENADRYL ITCH RELIEF EX) Apply topically. (Patient not taking: Reported on 11/04/2022)     oxyCODONE (OXY IR/ROXICODONE) 5 MG immediate release tablet Take 1 tablet (5 mg total) by mouth every 6 (six) hours as needed for severe pain. (Patient not taking: Reported on 11/22/2022) 60 tablet 0   No current facility-administered medications for this visit.     PHYSICAL EXAMINATION: ECOG PERFORMANCE STATUS: 1 - Symptomatic but completely ambulatory Vitals:   11/25/22 1322  BP: 100/70  Pulse: 68  Resp: 18  Temp: (!) 97.4 F (36.3 C)  SpO2: 100%   Filed Weights   11/25/22 1322  Weight: 230 lb (104.3  kg)    Physical Exam Constitutional:      General: He is not in acute distress.    Appearance: He is obese.  HENT:     Head: Normocephalic and atraumatic.  Eyes:     General: No scleral icterus. Cardiovascular:     Rate and Rhythm: Normal rate.     Heart sounds: Normal heart sounds.  Pulmonary:     Effort: Pulmonary effort is normal. No respiratory  distress.     Breath sounds: No wheezing.  Abdominal:     General: Bowel sounds are normal. There is no distension.     Palpations: Abdomen is soft.  Musculoskeletal:        General: No deformity. Normal range of motion.     Cervical back: Normal range of motion and neck supple.  Skin:    General: Skin is warm and dry.     Findings: No erythema.  Neurological:     Mental Status: He is alert and oriented to person, place, and time. Mental status is at baseline.     Cranial Nerves: No cranial nerve deficit.     Coordination: Coordination normal.  Psychiatric:        Mood and Affect: Mood normal.     LABORATORY DATA:  I have reviewed the data as listed    Latest Ref Rng & Units 11/25/2022    1:06 PM 11/22/2022    3:05 PM 11/04/2022    9:05 AM  CBC  WBC 4.0 - 10.5 K/uL 5.0  6.3  6.3   Hemoglobin 13.0 - 17.0 g/dL 13.9  14.2  14.1   Hematocrit 39.0 - 52.0 % 40.8  40.5  40.8   Platelets 150 - 400 K/uL 154  174  198       Latest Ref Rng & Units 11/25/2022    1:06 PM 11/22/2022    3:05 PM 11/04/2022    9:05 AM  CMP  Glucose 70 - 99 mg/dL 143  117  97   BUN 8 - 23 mg/dL '27  27  30   '$ Creatinine 0.61 - 1.24 mg/dL 1.45  1.41  1.81   Sodium 135 - 145 mmol/L 135  135  136   Potassium 3.5 - 5.1 mmol/L 3.7  3.9  3.7   Chloride 98 - 111 mmol/L 99  103  102   CO2 22 - 32 mmol/L '25  20  27   '$ Calcium 8.9 - 10.3 mg/dL 9.0  9.3  9.0   Total Protein 6.5 - 8.1 g/dL 6.7   6.3   Total Bilirubin 0.3 - 1.2 mg/dL 0.4   0.6   Alkaline Phos 38 - 126 U/L 38   46   AST 15 - 41 U/L 23   22   ALT 0 - 44 U/L 33   27     RADIOGRAPHIC STUDIES: I  have personally reviewed the radiological images as listed and agreed with the findings in the report. 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 Follow-up * Tracking Code: BO * EXAM: CT CHEST, ABDOMEN AND PELVIS WITHOUT CONTRAST TECHNIQUE: Multidetector CT imaging of the chest, abdomen and pelvis was performed following the standard protocol without IV contrast. RADIATION DOSE REDUCTION: This exam was performed according to the departmental dose-optimization program which includes automated exposure control, adjustment of the mA and/or kV according to patient size and/or use of iterative reconstruction technique. COMPARISON:  CT 05/29/2022 and older.  Bone scan 06/11/2022 FINDINGS: CT CHEST FINDINGS Cardiovascular: Coronary artery calcifications are seen. Heart is nonenlarged. On this non IV contrast exam, the aorta has a normal caliber with mild atherosclerotic plaque. Slightly tortuous course of the descending thoracic aorta. Previously the ascending aorta has a diameter measured at 4.1 cm and today 4.0 cm, unchanged. Mediastinum/Nodes: Small thyroid gland. No specific abnormal lymph node enlargement identified in the axillary region, hilum or mediastinum. Very  large hiatal hernia identified with a patulous distal esophagus. The majority of the stomach is within the mediastinum. There is also some wall thickening of the distal esophagus. Please correlate with any specific symptoms. This was seen previously. There is an area of fat necrosis identified posterior to the herniated stomach. Unchanged. Lungs/Pleura: Lungs are without consolidation or pneumothorax. There are scattered areas of dependent ground-glass interstitial septal thickening bilaterally diffusely. Distribution is relatively similar to the previous examination. No dominant lung nodule. The small 4 mm perifissural nodule adjacent to the minor fissure is  stable today on image 76 measuring 4 mm. Series 3. Stable 4 mm nodule abutting the mediastinum left upper lobe on series 3, image 78. Musculoskeletal: In the thoracic spine scattered degenerative changes are identified. Small sclerotic focus identified within the T5 vertebral body. This is unchanged from previous. CT ABDOMEN PELVIS FINDINGS Hepatobiliary: On this non IV contrast exam grossly the liver is unremarkable. Gallbladder is nondilated. Pancreas: There is some moderate atrophy of the pancreas. There is a portion of the midbody pancreas which herniates through the defect for the large hiatal hernia as well. Spleen: Spleen is nonenlarged. Adrenals/Urinary Tract: Adrenal glands are preserved. No abnormal calcification or obvious mass in the right kidney. No collecting system dilatation. The right ureter has a normal course and caliber down to the bladder. Prominent renal sinus fat. Surgical changes from left nephrectomy. No abnormal soft tissue seen in the surgical bed. Small area of fluid seen medial to the spleen and posterior to the adrenal gland is again noted and unchanged measuring a proximally 2.4 x 1.2 cm. No clear abnormal soft tissue. Grossly preserved contours of the urinary bladder. Stomach/Bowel: On this non oral contrast exam, large bowel has a normal course and caliber with scattered stool. Left-sided colonic diverticula. Surgical changes along the base of the cecum. Small bowel is nondilated. Again large hiatal hernia. Vascular/Lymphatic: Normal caliber aorta and IVC with scattered vascular calcifications. No specific abnormal lymph node enlargement seen in the abdomen and pelvis. Reproductive: Surgical changes in the area of the prostate. No abnormal soft tissue. Surgical clips along the scrotum. Other: No abdominal wall hernia or abnormality. No abdominopelvic ascites. Musculoskeletal: Once again there is a lytic lesion involving the spine at the L2 vertebral body with destructive components.  This appears larger than the prior examination with more involving the vertebral body. Also some potential changes of pathologic injury but overall preserved height to the vertebral body at this time. Small lucent focus in T11 is stable as well as L4. There is also a lucent lesion along the left femoral neck. Diameter of the lesion measures up to 2.8 cm and this than the cortex anteriorly. Diameter is more than 50% of the bone. Patient may be at risk for pathologic fracture. IMPRESSION: Increasing lucent bone lesions involving the spine at L2 and left femoral neck. There is bone cortical disruption at L2. In addition the size of the lesion in the left femoral neck is greater than 50% of the diameter of the bone itself. Please correlate for an increased risk of pathologic fracture. 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 No new abnormal soft tissue or lymph node enlargement. Very large hiatal hernia with majority of the stomach in the mediastinum there is also herniation of a small amount of the midbody of the pancreas. Distal esophagus has some subtle wall thickening. Please correlate with symptoms. Stable small lung nodules.  Recommend continued attention on follow-up. Results will be called to the ordering service by the radiology assistant team. Electronically Signed   By: Jill Side M.D.   On: 11/15/2022 14:02

## 2022-11-25 NOTE — Assessment & Plan Note (Signed)
continue Tylenol 650mg PRN 4h, he has oxycodone Rx if pain is not controlled with Tylenol  

## 2022-11-26 ENCOUNTER — Encounter: Payer: Self-pay | Admitting: Orthopedic Surgery

## 2022-11-26 ENCOUNTER — Inpatient Hospital Stay: Payer: Medicare Other | Admitting: Anesthesiology

## 2022-11-26 ENCOUNTER — Other Ambulatory Visit: Payer: Self-pay

## 2022-11-26 ENCOUNTER — Inpatient Hospital Stay: Payer: Medicare Other

## 2022-11-26 ENCOUNTER — Encounter
Admission: RE | Disposition: A | Discharge status: Home Health | Payer: Self-pay | Source: Home / Self Care | Attending: Orthopedic Surgery

## 2022-11-26 ENCOUNTER — Inpatient Hospital Stay
Admission: RE | Admit: 2022-11-26 | Discharge: 2022-11-28 | DRG: 481 | Disposition: A | Discharge status: Home Health | Payer: Medicare Other | Attending: Orthopedic Surgery | Admitting: Orthopedic Surgery

## 2022-11-26 DIAGNOSIS — I129 Hypertensive chronic kidney disease with stage 1 through stage 4 chronic kidney disease, or unspecified chronic kidney disease: Secondary | ICD-10-CM | POA: Diagnosis present

## 2022-11-26 DIAGNOSIS — Z8546 Personal history of malignant neoplasm of prostate: Secondary | ICD-10-CM | POA: Diagnosis not present

## 2022-11-26 DIAGNOSIS — Z9189 Other specified personal risk factors, not elsewhere classified: Principal | ICD-10-CM

## 2022-11-26 DIAGNOSIS — C7951 Secondary malignant neoplasm of bone: Secondary | ICD-10-CM | POA: Diagnosis present

## 2022-11-26 DIAGNOSIS — E785 Hyperlipidemia, unspecified: Secondary | ICD-10-CM | POA: Diagnosis present

## 2022-11-26 DIAGNOSIS — Z825 Family history of asthma and other chronic lower respiratory diseases: Secondary | ICD-10-CM

## 2022-11-26 DIAGNOSIS — Z85528 Personal history of other malignant neoplasm of kidney: Secondary | ICD-10-CM | POA: Diagnosis not present

## 2022-11-26 DIAGNOSIS — Z823 Family history of stroke: Secondary | ICD-10-CM | POA: Diagnosis not present

## 2022-11-26 DIAGNOSIS — M84452A Pathological fracture, left femur, initial encounter for fracture: Principal | ICD-10-CM | POA: Diagnosis present

## 2022-11-26 DIAGNOSIS — N183 Chronic kidney disease, stage 3 unspecified: Secondary | ICD-10-CM | POA: Diagnosis present

## 2022-11-26 DIAGNOSIS — Z9989 Dependence on other enabling machines and devices: Secondary | ICD-10-CM | POA: Diagnosis not present

## 2022-11-26 DIAGNOSIS — G4733 Obstructive sleep apnea (adult) (pediatric): Secondary | ICD-10-CM | POA: Diagnosis present

## 2022-11-26 DIAGNOSIS — E1122 Type 2 diabetes mellitus with diabetic chronic kidney disease: Secondary | ICD-10-CM | POA: Diagnosis present

## 2022-11-26 DIAGNOSIS — Z85828 Personal history of other malignant neoplasm of skin: Secondary | ICD-10-CM

## 2022-11-26 DIAGNOSIS — E1169 Type 2 diabetes mellitus with other specified complication: Secondary | ICD-10-CM

## 2022-11-26 DIAGNOSIS — Z8249 Family history of ischemic heart disease and other diseases of the circulatory system: Secondary | ICD-10-CM

## 2022-11-26 HISTORY — PX: FEMUR IM NAIL: SHX1597

## 2022-11-26 LAB — GLUCOSE, CAPILLARY
Glucose-Capillary: 122 mg/dL — ABNORMAL HIGH (ref 70–99)
Glucose-Capillary: 154 mg/dL — ABNORMAL HIGH (ref 70–99)

## 2022-11-26 SURGERY — INSERTION, INTRAMEDULLARY ROD, FEMUR
Anesthesia: General | Site: Hip | Laterality: Left

## 2022-11-26 MED ORDER — ZOLPIDEM TARTRATE 5 MG PO TABS: 5.0000 mg | ORAL_TABLET | Freq: Every evening | ORAL | Status: DC | PRN

## 2022-11-26 MED ORDER — PROPOFOL 10 MG/ML IV BOLUS
INTRAVENOUS | Status: AC
  Filled 2022-11-26: qty 20

## 2022-11-26 MED ORDER — LOSARTAN POTASSIUM 50 MG PO TABS
100.0000 mg | ORAL_TABLET | Freq: Every day | ORAL | Status: DC
  Administered 2022-11-26 – 2022-11-28 (×3): 100 mg via ORAL
  Filled 2022-11-26 (×4): qty 2

## 2022-11-26 MED ORDER — HYDROMORPHONE HCL 1 MG/ML IJ SOLN
1.0000 mg | INTRAMUSCULAR | Status: DC | PRN
  Administered 2022-11-26: 1 mg via INTRAVENOUS

## 2022-11-26 MED ORDER — PHENYLEPHRINE HCL-NACL 20-0.9 MG/250ML-% IV SOLN
INTRAVENOUS | Status: DC | PRN
  Administered 2022-11-26: 25 ug/min via INTRAVENOUS

## 2022-11-26 MED ORDER — SODIUM CHLORIDE 0.9 % IV SOLN: INTRAVENOUS | Status: DC

## 2022-11-26 MED ORDER — GABAPENTIN 600 MG PO TABS
600.0000 mg | ORAL_TABLET | Freq: Every day | ORAL | Status: DC
  Filled 2022-11-26: qty 1

## 2022-11-26 MED ORDER — CEFAZOLIN SODIUM-DEXTROSE 2-4 GM/100ML-% IV SOLN
2.0000 g | INTRAVENOUS | Status: AC
  Administered 2022-11-26: 2 g via INTRAVENOUS

## 2022-11-26 MED ORDER — FENTANYL CITRATE (PF) 100 MCG/2ML IJ SOLN
INTRAMUSCULAR | Status: AC
  Filled 2022-11-26: qty 2

## 2022-11-26 MED ORDER — OXYCODONE HCL 5 MG PO TABS
ORAL_TABLET | ORAL | Status: AC
  Filled 2022-11-26: qty 3

## 2022-11-26 MED ORDER — PROPOFOL 10 MG/ML IV BOLUS
INTRAVENOUS | Status: DC | PRN
  Administered 2022-11-26: 150 mg via INTRAVENOUS

## 2022-11-26 MED ORDER — ORAL CARE MOUTH RINSE: 15.0000 mL | Freq: Once | OROMUCOSAL | Status: AC

## 2022-11-26 MED ORDER — HYDROMORPHONE HCL 1 MG/ML IJ SOLN
INTRAMUSCULAR | Status: AC
  Administered 2022-11-26: 1 mg via INTRAVENOUS
  Filled 2022-11-26: qty 1

## 2022-11-26 MED ORDER — HYDROCHLOROTHIAZIDE 25 MG PO TABS
25.0000 mg | ORAL_TABLET | Freq: Every day | ORAL | Status: DC
  Administered 2022-11-26 – 2022-11-28 (×3): 25 mg via ORAL
  Filled 2022-11-26 (×3): qty 1

## 2022-11-26 MED ORDER — CHLORHEXIDINE GLUCONATE 0.12 % MT SOLN
OROMUCOSAL | Status: AC
  Administered 2022-11-26: 15 mL via OROMUCOSAL
  Filled 2022-11-26: qty 15

## 2022-11-26 MED ORDER — OXYCODONE HCL 5 MG PO TABS
5.0000 mg | ORAL_TABLET | ORAL | Status: DC | PRN
  Administered 2022-11-26 – 2022-11-28 (×4): 5 mg via ORAL
  Filled 2022-11-26 (×5): qty 1

## 2022-11-26 MED ORDER — EZETIMIBE 10 MG PO TABS
10.0000 mg | ORAL_TABLET | Freq: Every day | ORAL | Status: DC
  Administered 2022-11-26 – 2022-11-28 (×3): 10 mg via ORAL
  Filled 2022-11-26 (×3): qty 1

## 2022-11-26 MED ORDER — BISACODYL 10 MG RE SUPP: 10.0000 mg | Freq: Every day | RECTAL | Status: DC | PRN

## 2022-11-26 MED ORDER — FAMOTIDINE 20 MG PO TABS
ORAL_TABLET | ORAL | Status: AC
  Administered 2022-11-26: 20 mg via ORAL
  Filled 2022-11-26: qty 1

## 2022-11-26 MED ORDER — ACETAMINOPHEN 10 MG/ML IV SOLN
1000.0000 mg | Freq: Once | INTRAVENOUS | Status: AC
  Administered 2022-11-26: 1000 mg via INTRAVENOUS

## 2022-11-26 MED ORDER — EPHEDRINE SULFATE (PRESSORS) 50 MG/ML IJ SOLN
INTRAMUSCULAR | Status: DC | PRN
  Administered 2022-11-26: 7.5 mg via INTRAVENOUS

## 2022-11-26 MED ORDER — ACETAMINOPHEN 325 MG PO TABS
325.0000 mg | ORAL_TABLET | Freq: Four times a day (QID) | ORAL | Status: DC | PRN
  Administered 2022-11-28: 650 mg via ORAL
  Filled 2022-11-26: qty 2

## 2022-11-26 MED ORDER — OXYCODONE HCL 5 MG PO TABS: 5.0000 mg | ORAL_TABLET | Freq: Once | ORAL | Status: DC | PRN

## 2022-11-26 MED ORDER — SUGAMMADEX SODIUM 500 MG/5ML IV SOLN
INTRAVENOUS | Status: AC
  Filled 2022-11-26: qty 5

## 2022-11-26 MED ORDER — CHLORHEXIDINE GLUCONATE 0.12 % MT SOLN: 15.0000 mL | Freq: Once | OROMUCOSAL | Status: AC

## 2022-11-26 MED ORDER — CEFAZOLIN SODIUM-DEXTROSE 2-4 GM/100ML-% IV SOLN
INTRAVENOUS | Status: AC
  Filled 2022-11-26: qty 100

## 2022-11-26 MED ORDER — FAMOTIDINE 20 MG PO TABS: 20.0000 mg | ORAL_TABLET | Freq: Once | ORAL | Status: AC

## 2022-11-26 MED ORDER — SUGAMMADEX SODIUM 200 MG/2ML IV SOLN
INTRAVENOUS | Status: DC | PRN
  Administered 2022-11-26: 200 mg via INTRAVENOUS

## 2022-11-26 MED ORDER — METOCLOPRAMIDE HCL 5 MG/ML IJ SOLN: 5.0000 mg | Freq: Three times a day (TID) | INTRAMUSCULAR | Status: DC | PRN

## 2022-11-26 MED ORDER — KETOROLAC TROMETHAMINE 15 MG/ML IJ SOLN
7.5000 mg | Freq: Four times a day (QID) | INTRAMUSCULAR | Status: AC
  Administered 2022-11-26 – 2022-11-27 (×4): 7.5 mg via INTRAVENOUS
  Filled 2022-11-26 (×3): qty 1

## 2022-11-26 MED ORDER — METOCLOPRAMIDE HCL 5 MG PO TABS: 5.0000 mg | ORAL_TABLET | Freq: Three times a day (TID) | ORAL | Status: DC | PRN

## 2022-11-26 MED ORDER — GLYCOPYRROLATE 0.2 MG/ML IJ SOLN
INTRAMUSCULAR | Status: AC
  Filled 2022-11-26: qty 1

## 2022-11-26 MED ORDER — LORATADINE 10 MG PO TABS
10.0000 mg | ORAL_TABLET | Freq: Every day | ORAL | Status: DC
  Administered 2022-11-26 – 2022-11-28 (×3): 10 mg via ORAL
  Filled 2022-11-26 (×3): qty 1

## 2022-11-26 MED ORDER — METHOCARBAMOL 500 MG PO TABS: 500.0000 mg | ORAL_TABLET | Freq: Four times a day (QID) | ORAL | Status: DC | PRN

## 2022-11-26 MED ORDER — TRANEXAMIC ACID-NACL 1000-0.7 MG/100ML-% IV SOLN
INTRAVENOUS | Status: AC
  Filled 2022-11-26: qty 100

## 2022-11-26 MED ORDER — ROCURONIUM BROMIDE 100 MG/10ML IV SOLN
INTRAVENOUS | Status: DC | PRN
  Administered 2022-11-26: 20 mg via INTRAVENOUS
  Administered 2022-11-26: 50 mg via INTRAVENOUS

## 2022-11-26 MED ORDER — SEVOFLURANE IN SOLN
RESPIRATORY_TRACT | Status: AC
  Filled 2022-11-26: qty 250

## 2022-11-26 MED ORDER — DIPHENHYDRAMINE HCL 12.5 MG/5ML PO ELIX: 12.5000 mg | ORAL_SOLUTION | ORAL | Status: DC | PRN

## 2022-11-26 MED ORDER — DEXAMETHASONE SODIUM PHOSPHATE 10 MG/ML IJ SOLN
INTRAMUSCULAR | Status: DC | PRN
  Administered 2022-11-26: 10 mg via INTRAVENOUS

## 2022-11-26 MED ORDER — ONDANSETRON HCL 4 MG/2ML IJ SOLN
INTRAMUSCULAR | Status: DC | PRN
  Administered 2022-11-26: 4 mg via INTRAVENOUS

## 2022-11-26 MED ORDER — HYDROMORPHONE HCL 1 MG/ML IJ SOLN
INTRAMUSCULAR | Status: AC
  Filled 2022-11-26: qty 1

## 2022-11-26 MED ORDER — METHOCARBAMOL 1000 MG/10ML IJ SOLN
500.0000 mg | Freq: Four times a day (QID) | INTRAVENOUS | Status: DC | PRN
  Administered 2022-11-26: 500 mg via INTRAVENOUS
  Filled 2022-11-26: qty 5

## 2022-11-26 MED ORDER — DEXAMETHASONE SODIUM PHOSPHATE 10 MG/ML IJ SOLN
INTRAMUSCULAR | Status: AC
  Filled 2022-11-26: qty 1

## 2022-11-26 MED ORDER — FENTANYL CITRATE (PF) 100 MCG/2ML IJ SOLN
INTRAMUSCULAR | Status: DC | PRN
  Administered 2022-11-26 (×2): 50 ug via INTRAVENOUS

## 2022-11-26 MED ORDER — MAGNESIUM CITRATE PO SOLN: 1.0000 | Freq: Once | ORAL | Status: DC | PRN

## 2022-11-26 MED ORDER — ONDANSETRON HCL 4 MG/2ML IJ SOLN
4.0000 mg | Freq: Four times a day (QID) | INTRAMUSCULAR | Status: DC | PRN
  Administered 2022-11-26: 4 mg via INTRAVENOUS
  Filled 2022-11-26: qty 2

## 2022-11-26 MED ORDER — SENNOSIDES-DOCUSATE SODIUM 8.6-50 MG PO TABS: 1.0000 | ORAL_TABLET | Freq: Every evening | ORAL | Status: DC | PRN

## 2022-11-26 MED ORDER — GABAPENTIN 300 MG PO CAPS
600.0000 mg | ORAL_CAPSULE | Freq: Every day | ORAL | Status: DC
  Administered 2022-11-26 – 2022-11-27 (×2): 600 mg via ORAL
  Filled 2022-11-26 (×2): qty 2

## 2022-11-26 MED ORDER — HYDROMORPHONE HCL 1 MG/ML IJ SOLN: 0.5000 mg | INTRAMUSCULAR | Status: DC | PRN

## 2022-11-26 MED ORDER — PHENYLEPHRINE 80 MCG/ML (10ML) SYRINGE FOR IV PUSH (FOR BLOOD PRESSURE SUPPORT)
PREFILLED_SYRINGE | INTRAVENOUS | Status: AC
  Filled 2022-11-26: qty 10

## 2022-11-26 MED ORDER — ONDANSETRON HCL 4 MG PO TABS: 4.0000 mg | ORAL_TABLET | Freq: Four times a day (QID) | ORAL | Status: DC | PRN

## 2022-11-26 MED ORDER — DOCUSATE SODIUM 100 MG PO CAPS
100.0000 mg | ORAL_CAPSULE | Freq: Two times a day (BID) | ORAL | Status: DC
  Administered 2022-11-26 – 2022-11-28 (×4): 100 mg via ORAL
  Filled 2022-11-26 (×4): qty 1

## 2022-11-26 MED ORDER — GLYCOPYRROLATE 0.2 MG/ML IJ SOLN
INTRAMUSCULAR | Status: DC | PRN
  Administered 2022-11-26: .2 mg via INTRAVENOUS

## 2022-11-26 MED ORDER — PHENYLEPHRINE HCL (PRESSORS) 10 MG/ML IV SOLN
INTRAVENOUS | Status: DC | PRN
  Administered 2022-11-26: 80 ug via INTRAVENOUS
  Administered 2022-11-26: 160 ug via INTRAVENOUS

## 2022-11-26 MED ORDER — POVIDONE-IODINE 10 % EX SWAB
2.0000 | Freq: Once | CUTANEOUS | Status: AC
  Administered 2022-11-26: 2 via TOPICAL

## 2022-11-26 MED ORDER — KETOROLAC TROMETHAMINE 15 MG/ML IJ SOLN
INTRAMUSCULAR | Status: AC
  Filled 2022-11-26: qty 1

## 2022-11-26 MED ORDER — DEXMEDETOMIDINE HCL IN NACL 80 MCG/20ML IV SOLN
INTRAVENOUS | Status: DC | PRN
  Administered 2022-11-26 (×2): 4 ug via BUCCAL

## 2022-11-26 MED ORDER — BUPIVACAINE-EPINEPHRINE (PF) 0.25% -1:200000 IJ SOLN: INTRAMUSCULAR | Status: DC | PRN

## 2022-11-26 MED ORDER — BUPIVACAINE-EPINEPHRINE (PF) 0.5% -1:200000 IJ SOLN
INTRAMUSCULAR | Status: AC
  Filled 2022-11-26: qty 30

## 2022-11-26 MED ORDER — ONDANSETRON HCL 4 MG/2ML IJ SOLN
INTRAMUSCULAR | Status: AC
  Filled 2022-11-26: qty 2

## 2022-11-26 MED ORDER — EPHEDRINE 5 MG/ML INJ
INTRAVENOUS | Status: AC
  Filled 2022-11-26: qty 5

## 2022-11-26 MED ORDER — TRANEXAMIC ACID-NACL 1000-0.7 MG/100ML-% IV SOLN
1000.0000 mg | INTRAVENOUS | Status: AC
  Administered 2022-11-26: 1000 mg via INTRAVENOUS

## 2022-11-26 MED ORDER — FLUTICASONE PROPIONATE 50 MCG/ACT NA SUSP: 2.0000 | Freq: Every day | NASAL | Status: DC | PRN

## 2022-11-26 MED ORDER — OXYCODONE HCL 5 MG PO TABS
10.0000 mg | ORAL_TABLET | ORAL | Status: DC | PRN
  Administered 2022-11-26: 15 mg via ORAL

## 2022-11-26 MED ORDER — CEFAZOLIN SODIUM-DEXTROSE 2-4 GM/100ML-% IV SOLN
2.0000 g | Freq: Four times a day (QID) | INTRAVENOUS | Status: AC
  Administered 2022-11-26 – 2022-11-27 (×3): 2 g via INTRAVENOUS
  Filled 2022-11-26 (×3): qty 100

## 2022-11-26 MED ORDER — LACTATED RINGERS IV SOLN: INTRAVENOUS | Status: DC

## 2022-11-26 MED ORDER — BUPIVACAINE-EPINEPHRINE 0.5% -1:200000 IJ SOLN
INTRAMUSCULAR | Status: DC | PRN
  Administered 2022-11-26: 20 mL

## 2022-11-26 MED ORDER — FENTANYL CITRATE (PF) 100 MCG/2ML IJ SOLN
25.0000 ug | INTRAMUSCULAR | Status: DC | PRN
  Administered 2022-11-26 (×2): 50 ug via INTRAVENOUS

## 2022-11-26 MED ORDER — ACETAMINOPHEN 10 MG/ML IV SOLN
INTRAVENOUS | Status: AC
  Filled 2022-11-26: qty 100

## 2022-11-26 MED ORDER — OXYCODONE HCL 5 MG/5ML PO SOLN: 5.0000 mg | Freq: Once | ORAL | Status: DC | PRN

## 2022-11-26 MED ORDER — LIDOCAINE HCL (CARDIAC) PF 100 MG/5ML IV SOSY
PREFILLED_SYRINGE | INTRAVENOUS | Status: DC | PRN
  Administered 2022-11-26: 100 mg via INTRAVENOUS

## 2022-11-26 SURGICAL SUPPLY — 47 items
APL PRP STRL LF DISP 70% ISPRP (MISCELLANEOUS) ×1
BIT DRILL CANN 16 HIP (BIT) IMPLANT
BIT DRILL CANN STP 6/9 HIP (BIT) IMPLANT
BIT DRILL SHORT 4.2 (BIT) IMPLANT
BLADE TFNA HELICAL 100 NS (Anchor) IMPLANT
BNDG CMPR 5X4 CHSV STRCH STRL (GAUZE/BANDAGES/DRESSINGS) ×1
BNDG COHESIVE 4X5 TAN STRL LF (GAUZE/BANDAGES/DRESSINGS) IMPLANT
BRUSH SCRUB EZ  4% CHG (MISCELLANEOUS) ×1
BRUSH SCRUB EZ 4% CHG (MISCELLANEOUS) ×2 IMPLANT
CHLORAPREP W/TINT 26 (MISCELLANEOUS) ×1 IMPLANT
DRAPE 3/4 80X56 (DRAPES) ×1 IMPLANT
DRAPE U-SHAPE 47X51 STRL (DRAPES) ×1 IMPLANT
DRILL BIT SHORT 4.2 (BIT) ×2
DRSG AQUACEL AG ADV 3.5X 4 (GAUZE/BANDAGES/DRESSINGS) IMPLANT
DRSG AQUACEL AG ADV 3.5X10 (GAUZE/BANDAGES/DRESSINGS) ×1 IMPLANT
ELECT REM PT RETURN 9FT ADLT (ELECTROSURGICAL) ×1
ELECTRODE REM PT RTRN 9FT ADLT (ELECTROSURGICAL) ×1 IMPLANT
GAUZE XEROFORM 1X8 LF (GAUZE/BANDAGES/DRESSINGS) ×1 IMPLANT
GLOVE BIOGEL PI IND STRL 8.5 (GLOVE) ×1 IMPLANT
GLOVE SURG ORTHO 8.5 STRL (GLOVE) ×1 IMPLANT
GOWN STRL REUS W/ TWL LRG LVL3 (GOWN DISPOSABLE) ×1 IMPLANT
GOWN STRL REUS W/ TWL XL LVL3 (GOWN DISPOSABLE) ×1 IMPLANT
GOWN STRL REUS W/TWL LRG LVL3 (GOWN DISPOSABLE) ×1
GOWN STRL REUS W/TWL XL LVL3 (GOWN DISPOSABLE) ×1
GUIDEWIRE 3.2X400 (WIRE) IMPLANT
KIT PATIENT CARE HANA TABLE (KITS) ×1 IMPLANT
KIT TURNOVER CYSTO (KITS) ×1 IMPLANT
MANIFOLD NEPTUNE II (INSTRUMENTS) ×1 IMPLANT
MAT ABSORB  FLUID 56X50 GRAY (MISCELLANEOUS) ×1
MAT ABSORB FLUID 56X50 GRAY (MISCELLANEOUS) ×1 IMPLANT
NAIL TFNA LEFT 130D 400 (Nail) IMPLANT
NDL SPNL 20GX3.5 QUINCKE YW (NEEDLE) ×1 IMPLANT
NEEDLE SPNL 20GX3.5 QUINCKE YW (NEEDLE) ×1 IMPLANT
NS IRRIG 1000ML POUR BTL (IV SOLUTION) ×1 IMPLANT
NS IRRIG 500ML POUR BTL (IV SOLUTION) IMPLANT
PACK HIP COMPR (MISCELLANEOUS) ×1 IMPLANT
SCREW LOCK STAR 5X38 (Screw) IMPLANT
SCREW LOCK STAR 5X40 (Screw) IMPLANT
STAPLER SKIN PROX 35W (STAPLE) ×1 IMPLANT
SUT BONE WAX W31G (SUTURE) IMPLANT
SUT VIC AB 0 CT1 36 (SUTURE) ×1 IMPLANT
SUT VIC AB 2-0 CT1 27 (SUTURE) ×2
SUT VIC AB 2-0 CT1 TAPERPNT 27 (SUTURE) ×2 IMPLANT
SYR 30ML LL (SYRINGE) ×1 IMPLANT
TOWEL OR 17X26 4PK STRL BLUE (TOWEL DISPOSABLE) ×1 IMPLANT
TRAP FLUID SMOKE EVACUATOR (MISCELLANEOUS) ×1 IMPLANT
WATER STERILE IRR 500ML POUR (IV SOLUTION) ×1 IMPLANT

## 2022-11-26 NOTE — Anesthesia Postprocedure Evaluation (Signed)
Anesthesia Post Note  Patient: Braddock Servellon  Procedure(s) Performed: LEFT INTRAMEDULLARY (IM) NAIL FEMORAL (Left: Hip)  Patient location during evaluation: PACU Anesthesia Type: General Level of consciousness: awake and alert Pain management: pain level controlled Vital Signs Assessment: post-procedure vital signs reviewed and stable Respiratory status: spontaneous breathing, nonlabored ventilation, respiratory function stable and patient connected to nasal cannula oxygen Cardiovascular status: blood pressure returned to baseline and stable Postop Assessment: no apparent nausea or vomiting Anesthetic complications: no   No notable events documented.   Last Vitals:  Vitals:   11/26/22 1930 11/26/22 2014  BP: 117/77 127/81  Pulse: 60 (!) 57  Resp: 18   Temp: (!) 36.4 C   SpO2: 99% 100%    Last Pain:  Vitals:   11/26/22 1930  TempSrc:   PainSc: 4                  Martha Clan

## 2022-11-26 NOTE — Anesthesia Preprocedure Evaluation (Signed)
Anesthesia Evaluation  Patient identified by MRN, date of birth, ID band Patient awake    Reviewed: Allergy & Precautions, NPO status , Patient's Chart, lab work & pertinent test results  History of Anesthesia Complications (+) PONV, PROLONGED EMERGENCE and history of anesthetic complications  Airway Mallampati: III  TM Distance: <3 FB Neck ROM: full    Dental  (+) Chipped, Poor Dentition   Pulmonary neg shortness of breath, sleep apnea and Continuous Positive Airway Pressure Ventilation    Pulmonary exam normal        Cardiovascular Exercise Tolerance: Good hypertension, (-) angina (-) Past MI Normal cardiovascular exam     Neuro/Psych  Neuromuscular disease  negative psych ROS   GI/Hepatic negative GI ROS, Neg liver ROS,,,  Endo/Other  diabetes, Type 2    Renal/GU Renal disease     Musculoskeletal   Abdominal   Peds  Hematology negative hematology ROS (+)   Anesthesia Other Findings Past Medical History: No date: Anemia No date: Cancer ([place]) No date: Cancer of kidney (HCC) No date: CKD (chronic kidney disease) stage 3, GFR 30-59 ml/min (HCC) No date: Complication of anesthesia     Comment:  slow to wake after 1 surgery No date: CPAP use counseling No date: Diabetes mellitus without complication (HCC) No date: HLD (hyperlipidemia) No date: HTN (hypertension) No date: OSA on CPAP No date: PONV (postoperative nausea and vomiting) No date: Prostate cancer ([place]) No date: Seasonal allergies No date: Skin cancer  Past Surgical History: No date: HERNIA REPAIR 07/02/2022: IR FLUORO GUIDED NEEDLE PLC ASPIRATION/INJECTION LOC 04/27/2021: LAPAROSCOPIC APPENDECTOMY; N/A     Comment:  Procedure: APPENDECTOMY LAPAROSCOPIC;  Surgeon: Fredirick Maudlin, MD;  Location: [facility] ORS;  Service: General;                Laterality: N/A; 06/25/2021: LAPAROSCOPIC NEPHRECTOMY, HAND ASSISTED; Left     Comment:   Procedure: HAND ASSISTED LAPAROSCOPIC NEPHRECTOMY;                Surgeon: Billey Co, MD;  Location: [facility] ORS;                Service: Urology;  Laterality: Left; No date: prostatectomy No date: REPAIR KNEE LIGAMENT 12/29/2020: SHOULDER ARTHROSCOPY WITH SUBACROMIAL DECOMPRESSION AND  OPEN ROTATOR C; Right     Comment:  Procedure: Right shoulder arthroscopic rotator cuff               repair, subacromial decompression, and biceps tenodesis;               Surgeon: Leim Fabry, MD;  Location: [place];  Service: Orthopedics;  Laterality: Right; No date: TONSILLECTOMY No date: VASECTOMY  BMI    Body Mass Index: 33.60 kg/m      Reproductive/Obstetrics negative OB ROS                             Anesthesia Physical Anesthesia Plan  ASA: 3  Anesthesia Plan: General ETT   Post-op Pain Management:    Induction: Intravenous  PONV Risk Score and Plan: Ondansetron, Dexamethasone, Midazolam and Treatment may vary due to age or medical condition  Airway Management Planned: Oral ETT  Additional Equipment:   Intra-op Plan:   Post-operative Plan: Extubation in OR  Informed Consent: I  have reviewed the patients History and Physical, chart, labs and discussed the procedure including the risks, benefits and alternatives for the proposed anesthesia with the patient or authorized representative who has indicated his/her understanding and acceptance.     Dental Advisory Given  Plan Discussed with: Anesthesiologist, CRNA and Surgeon  Anesthesia Plan Comments: (Plan for GA 2/2 history of metastatic disease in his lumbar spine  Patient consented for risks of anesthesia including but not limited to:  - adverse reactions to medications - damage to eyes, teeth, lips or other oral mucosa - nerve damage due to positioning  - sore throat or hoarseness - Damage to heart, brain, nerves, lungs, other parts of body or loss of  life  Patient voiced understanding.)       Anesthesia Quick Evaluation

## 2022-11-26 NOTE — Op Note (Signed)
DATE OF SURGERY:  11/26/2022  TIME: 4:58 PM  PATIENT NAME:  Peter Stuart  AGE: 77 y.o.  PRE-OPERATIVE DIAGNOSIS:  M84.452A Pathological fracture, left femur, init encntr for fracture  POST-OPERATIVE DIAGNOSIS:  SAME  PROCEDURE:  LEFT INTRAMEDULLARY (IM) NAIL FEMORAL  SURGEON:  Lovell Sheehan  EBL:  741 cc  COMPLICATIONS:  none apparent  SPECIMENS: Medullary bone from the left proximal femur  OPERATIVE IMPLANTS: Synthes trochanteric femoral nail  400 mm by 9 mm  with interlocking helical blade  638 mm and two distal locking bolts  PREOPERATIVE INDICATIONS:  Peter Stuart is a 77 y.o. 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    The risks benefits and alternatives were discussed with the patient including but not limited to the risks of nonoperative treatment, versus surgical intervention including infection, bleeding, nerve injury, malunion, nonunion, hardware prominence, hardware failure, need for hardware removal, blood clots, cardiopulmonary complications, morbidity, mortality, among others, and they were willing to proceed.    OPERATIVE PROCEDURE:  The patient was brought to the operating room and placed in the supine position.  General anesthesia was administered. He was placed on the fracture table. The length of the femur was also measured using fluoroscopy. Time out was then performed after sterile prep and drape. He received preoperative antibiotics.  Incision was made proximal to the greater trochanter. A guidewire was placed in the appropriate position. Confirmation was made on AP and lateral views. The above-named nail was opened. I opened the proximal femur with a reamer. I then placed the nail by hand easily down. I did not need to ream the femur.  Once the nail was completely seated, I placed a guidepin into the femoral head into the center center  position through a second incision.  I measured the length, and then reamed the lateral cortex and up into the head. I then placed the helical blade. Anatomic fixation achieved. Bone quality was good. Medullary bone from the femoral canal were sent for pathology.  I then secured the proximal interlock. The proximal entry point of the nail was sealed with bone wax.  Using a perfect circle technique, two distal locking bolts were placed with excellent fixation.   I then removed the instruments, and took final C-arm pictures AP and lateral the entire length of the leg. The wounds were irrigated copiously and closed with Vicryl  followed by staples and dry sterile dressing. Sponge and needle count were correct.   The patient was awakened and returned to PACU in stable and satisfactory condition. There no complications and the patient tolerated the procedure well.  He will be weightbearing as tolerated.    Lovell Sheehan

## 2022-11-26 NOTE — H&P (Signed)
The patient has been re-examined, and the chart reviewed, and there have been no interval changes to the documented history and physical.  Plan a left trochanteric femoral nail today.  Anesthesia is not consulted regarding a peripheral nerve block for post-operative pain.  The risks, benefits, and alternatives have been discussed at length, and the patient is willing to proceed.

## 2022-11-26 NOTE — Anesthesia Procedure Notes (Addendum)
Procedure Name: Intubation Date/Time: 11/26/2022 3:20 PM  Performed by: Kamuela Magos, [place], CRNAPre-anesthesia Checklist: Patient identified, Patient being monitored, Timeout performed, Emergency Drugs available and Suction available Patient Re-evaluated:Patient Re-evaluated prior to induction Oxygen Delivery Method: Circle system utilized Preoxygenation: Pre-oxygenation with 100% oxygen Induction Type: IV induction Ventilation: Mask ventilation without difficulty Laryngoscope Size: McGraph and 4 Grade View: Grade I Tube type: Oral Tube size: 7.5 mm Number of attempts: 1 Airway Equipment and Method: Stylet Placement Confirmation: ETT inserted through vocal cords under direct vision, positive ETCO2 and breath sounds checked- equal and bilateral Secured at: 23 cm Tube secured with: Tape Dental Injury: Teeth and Oropharynx as per pre-operative assessment

## 2022-11-26 NOTE — Transfer of Care (Signed)
Immediate Anesthesia Transfer of Care Note  Patient: Peter Stuart  Procedure(s) Performed: LEFT INTRAMEDULLARY (IM) NAIL FEMORAL (Left: Hip)  Patient Location: PACU  Anesthesia Type:General  Level of Consciousness: drowsy  Airway & Oxygen Therapy: Patient Spontanous Breathing  Post-op Assessment: Report given to RN and Post -op Vital signs reviewed and stable  Post vital signs: Reviewed  Last Vitals:  Vitals Value Taken Time  BP 119/76 11/26/22 1652  Temp 97.80F   Pulse 66 11/26/22 1656  Resp 15 11/26/22 1656  SpO2 100 % 11/26/22 1656  Vitals shown include unvalidated device data.  Last Pain:  Vitals:   11/26/22 1325  TempSrc: Temporal  PainSc: 0-No pain         Complications: No notable events documented.

## 2022-11-26 NOTE — Progress Notes (Signed)
Patient arrived to pacu, moaning in pain post-op. Medicated as ordered with dilaudid added in pacu. Per Dr. Harlow Mares: attending provider "I wrote an order for floor admission, I would like him there"

## 2022-11-27 ENCOUNTER — Encounter: Payer: Self-pay | Admitting: Orthopedic Surgery

## 2022-11-27 LAB — HEMOGLOBIN AND HEMATOCRIT, BLOOD
HCT: 35 % — ABNORMAL LOW (ref 39.0–52.0)
Hemoglobin: 12 g/dL — ABNORMAL LOW (ref 13.0–17.0)

## 2022-11-27 NOTE — TOC Initial Note (Signed)
Transition of Care [facility]) - Initial/Assessment Note    Patient Details  Name: Peter Stuart MRN: [id number] Date of Birth: [birth year]  [facility] CM/SW Contact:    Conception Oms, RN Phone Number: 11/27/2022, 10:51 AM  Clinical Narrative:     spoke with the patient regarding his DC plan and needs He lives alone but has 2 daughters that come help him as often as possible,  They both work He has DME at home      Rollator (4 wheels);Rolling Walker (2 wheels);Shower seat;Grab bars - tub/shower 3 in 1 His daughters will provide transportation, hew does not need additional DME        He is agreeable to [facility] PT and OT and does not have a preference of agencies, [facility] accepted the patient   Expected Discharge Plan: [place] Barriers to Discharge: No Barriers Identified   Patient Goals and CMS Choice            Expected Discharge Plan and Services   Discharge Planning Services: CM Consult   Living arrangements for the past 2 months: Single Family Home                 DME Arranged: N/A         HH Arranged: PT, OT HH Agency: [place] ([facility])        Prior Living Arrangements/Services Living arrangements for the past 2 months: Single Family Home Lives with:: Self Patient language and need for interpreter reviewed:: Yes Do you feel safe going back to the place where you live?: Yes      Need for Family Participation in Patient Care: Yes (Comment) Care giver support system in place?: Yes (comment) Current home services: DME (Rollator (4 wheels);Rolling Walker (2 wheels);Shower seat;Grab bars - tub/shower) Criminal Activity/Legal Involvement Pertinent to Current Situation/Hospitalization: No - Comment as needed  Activities of Daily Living Home Assistive Devices/Equipment: CPAP, Cane (specify quad or straight), Walker (specify type) ADL Screening (condition at time of admission) Patient's cognitive ability adequate to  safely complete daily activities?: Yes Is the patient deaf or have difficulty hearing?: No Does the patient have difficulty seeing, even when wearing glasses/contacts?: No Does the patient have difficulty concentrating, remembering, or making decisions?: No Patient able to express need for assistance with ADLs?: Yes Does the patient have difficulty dressing or bathing?: Yes Independently performs ADLs?: Yes (appropriate for developmental age) Does the patient have difficulty walking or climbing stairs?: Yes Weakness of Legs: Left Weakness of Arms/Hands: None  Permission Sought/Granted   Permission granted to share information with : Yes, Verbal Permission Granted              Emotional Assessment Appearance:: Appears stated age Attitude/Demeanor/Rapport: Engaged Affect (typically observed): Pleasant Orientation: : Oriented to Self, Oriented to Place, Oriented to  Time, Oriented to Situation Alcohol / Substance Use: Not Applicable Psych Involvement: No (comment)  Admission diagnosis:  Impending pathologic fracture [Z91.89] Patient Active Problem List   Diagnosis Date Noted   Impending pathologic fracture 11/26/2022   Neoplasm related pain 11/04/2022   Metastasis to bone ([place]) 09/23/2022   Periodic limb movement disorder 08/19/2022   Skin rash 08/12/2022   Encounter for antineoplastic immunotherapy 07/22/2022   Goals of care, counseling/discussion 06/03/2022   Foot swelling 02/13/2022   Imbalance 02/13/2022   Right shoulder pain 02/13/2022   CPAP use counseling 08/20/2021   Obesity (BMI 30-39.9) 08/20/2021   Type II or unspecified type diabetes mellitus  without mention of complication, not stated as uncontrolled 07/30/2021   CKD (chronic kidney disease) stage 3, GFR 30-59 ml/min ([place]) 07/19/2021   History of left nephrectomy 07/08/2021   Cancer of kidney, left ([place]) 06/25/2021   Acute appendicitis 04/27/2021   Pain and swelling of lower leg, right 03/05/2021   Prostate  cancer ([place]) 03/05/2021   DNR (do not resuscitate) 06/13/2020   Medicare annual wellness visit, subsequent 06/13/2020   Left hip pain 03/03/2020   Status post rotator cuff surgery 03/03/2020   Type 2 diabetes mellitus with hyperlipidemia ([place]) 02/16/2020   Obesity, morbid ([place]) 02/14/2020   OSA on CPAP 10/15/2019   Status post prostatectomy 06/19/2017   Polyneuropathy 09/17/2016   B12 deficiency 08/13/2016   Lumbar radiculopathy 06/12/2016   Mixed hyperlipidemia 11/30/2015   Essential hypertension 04/26/2015   Memory loss 02/18/2015   History of squamous cell carcinoma of skin 02/15/2015   PCP:  Dion Body, MD Pharmacy:   [facility] [phone] - [address], [address] [place] [address] Phone: [phone] Fax: [phone]     Social Determinants of Health (SDOH) Social History: SDOH Screenings   Food Insecurity: No Food Insecurity (11/27/2022)  Housing: Low Risk  (11/27/2022)  Transportation Needs: No Transportation Needs (11/27/2022)  Utilities: Not At Risk (11/27/2022)  Tobacco Use: Low Risk  (11/27/2022)   SDOH Interventions:     Readmission Risk Interventions     No data to display

## 2022-11-27 NOTE — Evaluation (Signed)
Physical Therapy Evaluation Patient Details Name: Peter Stuart MRN: [id number] DOB: 04-08-1946 Today's Date: 11/27/2022  History of Present Illness  Patient complains of left hip pain. Onset of the symptoms was a week ago. Inciting event:  fall . The patient reports the hip pain is worse with weight bearing. Associated symptoms: none. Aggravating symptoms include: any weight bearing. Patient has had no prior hip problems. Pt Dx with impending pathological hip fracture 2/2 Metastatic Renal cell Ca. Pt is now S/P Intramodullary Nail in Femur. WBAT to LLE  Clinical Impression  Pt received in chair pleasant and motivated agreeable to PT evaluation. Pt reported of nausea after ambulation which resolved with rest. Pt also stated that he had difficulties swallowing his breakfast which may have caused his nausea. Nurse made aware of it. Pt PLOF Ind with all functional mobility at household level and community level without AD until 3 months ago due to worsening L hip pain, pt began to use Rolator walker which does not have breaks. PT educated pt to listen to his body when WB through LLE and to ambulate within pain free limits to prevent falls and injuries. Pt also advised ot use FWW and not Rolator for controlled mobility and sfety. Today's assessment revealed transfers with sup to min guard and ambulated with FWW with min guard WBAT to LLE with Max VC for safe turning without twisting the LLE while turning and to lead with LLE. Pt introduced to Isometrics and AROM to LLE and provided on White board so that he can perform them every 3 hours.  Pt tol Tx well. PT will continue in acute care. Pt will benefit form HHPT and H hide, Rolator walker with breaks and BSC to return to functional independence safely.       Recommendations for follow up therapy are one component of a multi-disciplinary discharge planning process, led by the attending physician.  Recommendations may be updated based on patient status,  additional functional criteria and insurance authorization.  Follow Up Recommendations Home health PT (home health Aide)      Assistance Recommended at Discharge Intermittent Supervision/Assistance  Patient can return home with the following  A little help with walking and/or transfers;A little help with bathing/dressing/bathroom;Assistance with cooking/housework;Assist for transportation    Equipment Recommendations    Recommendations for Other Services       Functional Status Assessment Patient has had a recent decline in their functional status and demonstrates the ability to make significant improvements in function in a reasonable and predictable amount of time.     Precautions / Restrictions Precautions Precautions: Fall Restrictions Weight Bearing Restrictions: Yes LLE Weight Bearing: Weight bearing as tolerated      Mobility  Bed Mobility               General bed mobility comments: received in chair    Transfers Overall transfer level: Needs assistance Equipment used: Rolling walker (2 wheels) Transfers: Sit to/from Stand Sit to Stand: Supervision                Ambulation/Gait Ambulation/Gait assistance: Min guard Gait Distance (Feet): 30 Feet Assistive device: Rolling walker (2 wheels) Gait Pattern/deviations: Step-to pattern, Decreased stride length, Narrow base of support Gait velocity: dec     General Gait Details: needs VC fro sequencing to prevent twisting of the LLE.  Stairs: N/A            Wheelchair Mobility    Modified Rankin (Stroke Patients Only)  Balance Overall balance assessment: Needs assistance Sitting-balance support: Feet supported Sitting balance-Leahy Scale: Normal     Standing balance support: Bilateral upper extremity supported Standing balance-Leahy Scale: Good Standing balance comment: needs VC for sequencing but steady on feet.                             Pertinent Vitals/Pain  Pain Assessment Pain Assessment: No/denies pain (tightness and discomfort)    Home Living Family/patient expects to be discharged to:: Private residence Living Arrangements: Alone Available Help at Discharge: Available PRN/intermittently Type of Home: House (town house) Home Access: Level entry       Home Layout: One level Home Equipment: Rollator (4 wheels);Rolling Walker (2 wheels);Shower seat;Grab bars - tub/shower Additional Comments: grab bars are at an awkward place.    Prior Function Prior Level of Function : Independent/Modified Independent Pt sleeps in his recliner that has a stand lift.              Mobility Comments: Ind ambulator at household level and community level activity  participation. began to use a RW over past 3 months due to painin the L hip. ADLs Comments: Ind with bahting, dressing, toileting, cooking, and meds. Family lives close by. Daughters can make some adjustment to their schedule in the beginning but they both work.      Hand Dominance   Dominant Hand: Right    Extremity/Trunk Assessment   Upper Extremity Assessment Upper Extremity Assessment: Overall WFL for tasks assessed    Lower Extremity Assessment Lower Extremity Assessment: LLE deficits/detail LLE Deficits / Details: 3-/5 MMT in L hip and 3/5 in L knee and ankle LLE Sensation: WNL LLE Coordination: WNL       Communication   Communication: No difficulties  Cognition Arousal/Alertness: Awake/alert Behavior During Therapy: WFL for tasks assessed/performed Overall Cognitive Status: Within Functional Limits for tasks assessed                                          General Comments General comments (skin integrity, edema, etc.): dressing intact    Exercises General Exercises - Lower Extremity Ankle Circles/Pumps: AROM, 10 reps, Both, Seated Quad Sets: Left, 10 reps, Seated Long Arc Quad: AROM, Both, 10 reps, Seated   Assessment/Plan    PT Assessment  Patient needs continued PT services  PT Problem List Decreased strength;Decreased range of motion;Decreased knowledge of use of DME;Decreased knowledge of precautions       PT Treatment Interventions Gait training;Functional mobility training;Therapeutic activities;Therapeutic exercise;Patient/family education;Neuromuscular re-education    PT Goals (Current goals can be found in the Care Plan section)  Acute Rehab PT Goals Patient Stated Goal: " I want to go home with some help." PT Goal Formulation: With patient Time For Goal Achievement: 12/11/22 Potential to Achieve Goals: Good    Frequency 7X/week     Co-evaluation               AM-PAC PT "6 Clicks" Mobility  Outcome Measure Help needed turning from your back to your side while in a flat bed without using bedrails?: None Help needed moving from lying on your back to sitting on the side of a flat bed without using bedrails?: A Little Help needed moving to and from a bed to a chair (including a wheelchair)?: A Little Help needed standing up from a  chair using your arms (e.g., wheelchair or bedside chair)?: A Little Help needed to walk in hospital room?: A Little Help needed climbing 3-5 steps with a railing? : A Lot 6 Click Score: 18    End of Session Equipment Utilized During Treatment: Gait belt Activity Tolerance: Patient tolerated treatment well Patient left: in chair;with call bell/phone within reach;with chair alarm set Nurse Communication: Mobility status;Precautions;Weight bearing status (c/o nausea and swollowing difficulties with breakfast) PT Visit Diagnosis: Other abnormalities of gait and mobility (R26.89);Muscle weakness (generalized) (M62.81);Difficulty in walking, not elsewhere classified (R26.2)    Time: 5053-9767 PT Time Calculation (min) (ACUTE ONLY): 46 min   Charges:   PT Evaluation $PT Eval Moderate Complexity: 1 Mod PT Treatments $Gait Training: 8-22 mins $Therapeutic Exercise: 8-22 mins       Empress Newmann PT DPT 10:20 AM,11/27/22

## 2022-11-27 NOTE — Plan of Care (Signed)
  Problem: Clinical Measurements: Goal: Ability to maintain clinical measurements within normal limits will improve Outcome: Progressing   Problem: Clinical Measurements: Goal: Will remain free from infection Outcome: Progressing   Problem: Clinical Measurements: Goal: Respiratory complications will improve Outcome: Progressing   Problem: Nutrition: Goal: Adequate nutrition will be maintained Outcome: Progressing   Problem: Coping: Goal: Level of anxiety will decrease Outcome: Progressing   Problem: Safety: Goal: Ability to remain free from injury will improve Outcome: Progressing

## 2022-11-27 NOTE — Progress Notes (Signed)
  Subjective:  Patient reports pain as mild to moderate.    Objective:   VITALS:   Vitals:   11/26/22 1930 11/26/22 2014 11/26/22 2351 11/27/22 0351  BP: 117/77 127/81 128/78 114/80  Pulse: 60 (!) 57 66 72  Resp: '18 18 18 18  '$ Temp: (!) 97.5 F (36.4 C) 97.6 F (36.4 C) 98.4 F (36.9 C) 97.6 F (36.4 C)  TempSrc:  Oral Oral   SpO2: 99% 100% 99% 98%  Weight:      Height:        PHYSICAL EXAM:  Neurologically intact ABD soft Neurovascular intact Sensation intact distally Intact pulses distally Dorsiflexion/Plantar flexion intact Incision: dressing C/D/I No cellulitis present Compartment soft  LABS  Results for orders placed or performed during the hospital encounter of 11/26/22 (from the past 24 hour(s))  Glucose, capillary     Status: Abnormal   Collection Time: 11/26/22  1:09 PM  Result Value Ref Range   Glucose-Capillary 122 (H) 70 - 99 mg/dL   Comment 1 Notify RN    Comment 2 Document in Chart   Glucose, capillary     Status: Abnormal   Collection Time: 11/26/22  4:53 PM  Result Value Ref Range   Glucose-Capillary 154 (H) 70 - 99 mg/dL  Hemoglobin and hematocrit, blood     Status: Abnormal   Collection Time: 11/27/22  5:03 AM  Result Value Ref Range   Hemoglobin 12.0 (L) 13.0 - 17.0 g/dL   HCT 35.0 (L) 39.0 - 52.0 %    DG FEMUR MIN 2 VIEWS LEFT  Result Date: 11/26/2022 CLINICAL DATA:  Surgery, elective EXAM: LEFT FEMUR 2 VIEWS COMPARISON:  Left hip MRI 10/01/2022 FINDINGS: Intraoperative images during prophylactic intramedullary nail fixation of the femur with known underlying metastasis at the intertrochanteric region. No evidence of immediate complication. IMPRESSION: Intraoperative images during intramedullary nail fixation of the femur with known underlying intertrochanteric region metastasis. No evidence of immediate complication. Electronically Signed   By: Maurine Simmering M.D.   On: 11/26/2022 17:00   DG C-Arm 1-60 Min-No Report  Result Date:  11/26/2022 Fluoroscopy was utilized by the requesting physician.  No radiographic interpretation.   DG C-Arm 1-60 Min-No Report  Result Date: 11/26/2022 Fluoroscopy was utilized by the requesting physician.  No radiographic interpretation.    Assessment/Plan: 1 Day Post-Op   Principal Problem:   Impending pathologic fracture   Advance diet Up with therapy WBAT LLE Will check back later today for possible discharge   Carlynn Spry , PA-C 11/27/2022, 8:05 AM

## 2022-11-28 LAB — SURGICAL PATHOLOGY

## 2022-11-28 NOTE — Discharge Instructions (Signed)

## 2022-11-28 NOTE — Progress Notes (Signed)
  Subjective:  Patient reports pain as mild to moderate.    Objective:   VITALS:   Vitals:   11/27/22 1639 11/27/22 2008 11/27/22 2309 11/28/22 0506  BP: 108/76 113/61 100/63 107/61  Pulse: 66 73 74 73  Resp: '16 16 18 18  '$ Temp: 98.1 F (36.7 C) 98.1 F (36.7 C) 99 F (37.2 C) 98.2 F (36.8 C)  TempSrc:      SpO2: 100% 97% 91% 95%  Weight:      Height:        PHYSICAL EXAM:  Neurologically intact ABD soft Neurovascular intact Sensation intact distally Intact pulses distally Dorsiflexion/Plantar flexion intact Incision: dressing C/D/I No cellulitis present Compartment soft  LABS  No results found for this or any previous visit (from the past 24 hour(s)).  DG FEMUR MIN 2 VIEWS LEFT  Result Date: 11/26/2022 CLINICAL DATA:  Surgery, elective EXAM: LEFT FEMUR 2 VIEWS COMPARISON:  Left hip MRI 10/01/2022 FINDINGS: Intraoperative images during prophylactic intramedullary nail fixation of the femur with known underlying metastasis at the intertrochanteric region. No evidence of immediate complication. IMPRESSION: Intraoperative images during intramedullary nail fixation of the femur with known underlying intertrochanteric region metastasis. No evidence of immediate complication. Electronically Signed   By: Maurine Simmering M.D.   On: 11/26/2022 17:00   DG C-Arm 1-60 Min-No Report  Result Date: 11/26/2022 Fluoroscopy was utilized by the requesting physician.  No radiographic interpretation.   DG C-Arm 1-60 Min-No Report  Result Date: 11/26/2022 Fluoroscopy was utilized by the requesting physician.  No radiographic interpretation.    Assessment/Plan: 2 Days Post-Op   Principal Problem:   Impending pathologic fracture   Up with therapy Discharge home today after PT goals met   Carlynn Spry , PA-C 11/28/2022, 7:01 AM

## 2022-11-28 NOTE — Discharge Summary (Signed)
Physician Discharge Summary  Patient ID: Peter Stuart MRN: [id number] DOB/AGE: Dec 30, 1945 77 y.o.  Admit date: 11/26/2022 Discharge date: 11/28/2022  Admission Diagnoses:  M84.452A Pathological fracture, left femur, init encntr for fracture Impending pathologic fracture  Discharge Diagnoses:  M84.452A Pathological fracture, left femur, init encntr for fracture Principal Problem:   Impending pathologic fracture   Past Medical History:  Diagnosis Date   Anemia    Cancer ([place])    Cancer of kidney ([place])    CKD (chronic kidney disease) stage 3, GFR 30-59 ml/min (HCC)    Complication of anesthesia    slow to wake after 1 surgery   CPAP use counseling    Diabetes mellitus without complication ([place])    HLD (hyperlipidemia)    HTN (hypertension)    OSA on CPAP    PONV (postoperative nausea and vomiting)    Prostate cancer ([place])    Seasonal allergies    Skin cancer     Surgeries: Procedure(s): LEFT INTRAMEDULLARY (IM) NAIL FEMORAL on 11/26/2022   Consultants (if any):   Discharged Condition: Improved  Hospital Course: Peter Stuart is an 77 y.o. male who was admitted 11/26/2022 with a diagnosis of  M84.452A Pathological fracture, left femur, init encntr for fracture Impending pathologic fracture and went to the operating room on 11/26/2022 and underwent the above named procedures.    He was given perioperative antibiotics:  Anti-infectives (From admission, onward)    Start     Dose/Rate Route Frequency Ordered Stop   11/27/22 0600  ceFAZolin (ANCEF) IVPB 2g/100 mL premix        2 g 200 mL/hr over 30 Minutes Intravenous On call to O.R. 11/26/22 1259 11/26/22 1539   11/26/22 2100  ceFAZolin (ANCEF) IVPB 2g/100 mL premix        2 g 200 mL/hr over 30 Minutes Intravenous Every 6 hours 11/26/22 2000 11/27/22 1131   11/26/22 1300  ceFAZolin (ANCEF) 2-4 GM/100ML-% IVPB       Note to Pharmacy: Trudie Reed S: cabinet override      11/26/22 1300 11/26/22 1524     .    He benefited  maximally from the hospital stay and there were no complications.    Recent vital signs:  Vitals:   11/27/22 2309 11/28/22 0506  BP: 100/63 107/61  Pulse: 74 73  Resp: 18 18  Temp: 99 F (37.2 C) 98.2 F (36.8 C)  SpO2: 91% 95%    Recent laboratory studies:  Lab Results  Component Value Date   HGB 12.0 (L) 11/27/2022   HGB 13.9 11/25/2022   HGB 14.2 11/22/2022   Lab Results  Component Value Date   WBC 5.0 11/25/2022   PLT 154 11/25/2022   Lab Results  Component Value Date   INR 1.0 07/02/2022   Lab Results  Component Value Date   NA 135 11/25/2022   K 3.7 11/25/2022   CL 99 11/25/2022   CO2 25 11/25/2022   BUN 27 (H) 11/25/2022   CREATININE 1.45 (H) 11/25/2022   GLUCOSE 143 (H) 11/25/2022    Discharge Medications:   Allergies as of 11/28/2022   No Known Allergies      Medication List     TAKE these medications    acetaminophen 650 MG suppository Commonly known as: TYLENOL Place 650 mg rectally every 6 (six) hours as needed.   Al[place]ra Allergy 60 MG tablet Generic drug: fexofenadine Take 60 mg by mouth daily.   BENADRYL ITCH RELIEF EX Apply topically.   cholecalciferol  25 MCG (1000 UNIT) tablet Commonly known as: VITAMIN D3 Take 1,000 Units by mouth daily.   clotrimazole 1 % cream Commonly known as: LOTRIMIN Apply topically 2 (two) times daily.   Coenzyme Q10 10 MG capsule Take 10 mg by mouth daily.   ezetimibe 10 MG tablet Commonly known as: ZETIA Take 1 tablet by mouth daily.   Fish Oil 1000 MG Caps Take 1 capsule by mouth 1 day or 1 dose.   fluticasone 50 MCG/ACT nasal spray Commonly known as: FLONASE Place 2 sprays into both nostrils daily.   gabapentin 600 MG tablet Commonly known as: NEURONTIN Take 600 mg by mouth at bedtime.   GINKGO BILOBA COMPLEX PO Take 1 tablet by mouth daily.   hydrochlorothiazide 25 MG tablet Commonly known as: HYDRODIURIL Take 1 tablet by mouth daily.   KEYTRUDA IV Inject into the vein.    losartan 100 MG tablet Commonly known as: COZAAR Take 100 mg by mouth daily.   multivitamin capsule Take 1 capsule by mouth daily.   multivitamin-lutein Caps capsule Take 1 capsule by mouth daily.   oxyCODONE 5 MG immediate release tablet Commonly known as: Oxy IR/ROXICODONE Take 1 tablet (5 mg total) by mouth every 6 (six) hours as needed for severe pain.   terbinafine 1 % cream Commonly known as: LAMISIL Apply 1 application topically daily as needed (irritation).   triamcinolone cream 0.1 % Commonly known as: KENALOG Apply 1 Application topically 2 (two) times daily.        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 No evidence of immediate complication. IMPRESSION: Intraoperative images during intramedullary nail fixation of the femur with known underlying intertrochanteric region metastasis. No evidence of immediate complication. Electronically Signed   By: Maurine Simmering M.D.   On: 11/26/2022 17:00   DG C-Arm 1-60 Min-No Report  Result Date: 11/26/2022 Fluoroscopy was utilized by the requesting physician.  No radiographic interpretation.   DG C-Arm 1-60 Min-No Report  Result Date: 11/26/2022 Fluoroscopy was utilized by the requesting physician.  No radiographic interpretation.   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 Follow-up * Tracking Code: BO * EXAM: CT CHEST, ABDOMEN AND PELVIS WITHOUT CONTRAST TECHNIQUE: Multidetector CT imaging of the chest, abdomen and pelvis was performed following the standard protocol without IV contrast. RADIATION DOSE REDUCTION: This exam was performed according to  the departmental dose-optimization program which includes automated exposure control, adjustment of the mA and/or kV according to patient size and/or use of iterative reconstruction technique. COMPARISON:  CT 05/29/2022 and older.  Bone scan 06/11/2022 FINDINGS: CT CHEST FINDINGS Cardiovascular: Coronary artery calcifications are seen. Heart is nonenlarged. On this non IV contrast exam, the aorta has a normal caliber with mild atherosclerotic plaque. Slightly tortuous course of the descending thoracic aorta. Previously the ascending aorta has a diameter measured at 4.1 cm and today 4.0 cm, unchanged. Mediastinum/Nodes: Small thyroid gland. No specific abnormal lymph node enlargement identified in the axillary region, hilum or mediastinum. Very large hiatal hernia identified with a patulous distal esophagus. The majority of the stomach is within the mediastinum. There is also some wall thickening of the distal esophagus. Please correlate with any specific symptoms. This was  seen previously. There is an area of fat necrosis identified posterior to the herniated stomach. Unchanged. Lungs/Pleura: Lungs are without consolidation or pneumothorax. There are scattered areas of dependent ground-glass interstitial septal thickening bilaterally diffusely. Distribution is relatively similar to the previous examination. No dominant lung nodule. The small 4 mm perifissural nodule adjacent to the minor fissure is stable today on image 76 measuring 4 mm. Series 3. Stable 4 mm nodule abutting the mediastinum left upper lobe on series 3, image 78. Musculoskeletal: In the thoracic spine scattered degenerative changes are identified. Small sclerotic focus identified within the T5 vertebral body. This is unchanged from previous. CT ABDOMEN PELVIS FINDINGS Hepatobiliary: On this non IV contrast exam grossly the liver is unremarkable. Gallbladder is nondilated. Pancreas: There is some moderate atrophy of the pancreas. There is a portion  of the midbody pancreas which herniates through the defect for the large hiatal hernia as well. Spleen: Spleen is nonenlarged. Adrenals/Urinary Tract: Adrenal glands are preserved. No abnormal calcification or obvious mass in the right kidney. No collecting system dilatation. The right ureter has a normal course and caliber down to the bladder. Prominent renal sinus fat. Surgical changes from left nephrectomy. No abnormal soft tissue seen in the surgical bed. Small area of fluid seen medial to the spleen and posterior to the adrenal gland is again noted and unchanged measuring a proximally 2.4 x 1.2 cm. No clear abnormal soft tissue. Grossly preserved contours of the urinary bladder. Stomach/Bowel: On this non oral contrast exam, large bowel has a normal course and caliber with scattered stool. Left-sided colonic diverticula. Surgical changes along the base of the cecum. Small bowel is nondilated. Again large hiatal hernia. Vascular/Lymphatic: Normal caliber aorta and IVC with scattered vascular calcifications. No specific abnormal lymph node enlargement seen in the abdomen and pelvis. Reproductive: Surgical changes in the area of the prostate. No abnormal soft tissue. Surgical clips along the scrotum. Other: No abdominal wall hernia or abnormality. No abdominopelvic ascites. Musculoskeletal: Once again there is a lytic lesion involving the spine at the L2 vertebral body with destructive components. This appears larger than the prior examination with more involving the vertebral body. Also some potential changes of pathologic injury but overall preserved height to the vertebral body at this time. Small lucent focus in T11 is stable as well as L4. There is also a lucent lesion along the left femoral neck. Diameter of the lesion measures up to 2.8 cm and this than the cortex anteriorly. Diameter is more than 50% of the bone. Patient may be at risk for pathologic fracture. IMPRESSION: Increasing lucent bone lesions  involving the spine at L2 and left femoral neck. There is bone cortical disruption at L2. In addition the size of the lesion in the left femoral neck is greater than 50% of the diameter of the bone itself. Please correlate for an increased risk of pathologic fracture. 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 No new abnormal soft tissue or lymph node enlargement. Very large hiatal hernia with majority of the stomach in the mediastinum there is also herniation of a small amount of the midbody of the pancreas. Distal esophagus has some subtle wall thickening. Please correlate with symptoms. Stable small lung nodules. Recommend continued attention on follow-up. Results will be called to the ordering service by the radiology assistant team. Electronically Signed   By: Jill Side M.D.   On: 11/15/2022 14:02    Disposition:  Discharge disposition: 01-Home or Self Care            Signed: Carlynn Spry ,PA-C 11/28/2022, 7:05 AM

## 2022-11-28 NOTE — TOC Transition Note (Signed)
Transition of Care [facility]) - CM/SW Discharge Note   Patient Details  Name: Peter Stuart MRN: [id number] Date of Birth: 03-Nov-1945  Transition of Care [facility]) CM/SW Contact:  Conception Oms, RN Phone Number: 11/28/2022, 8:55 AM   Clinical Narrative:     Patient is ready to DC from a TOC Standpoint Has [place] set up and DME at home  Final next level of care: [employer] Barriers to Discharge: No Barriers Identified   Patient Goals and CMS Choice      Discharge Placement                         Discharge Plan and Services Additional resources added to the After Visit Summary for     Discharge Planning Services: CM Consult            DME Arranged: N/A         HH Arranged: PT, OT [place] Agency: [place] ([facility])        Social Determinants of Health (SDOH) Interventions SDOH Screenings   Food Insecurity: No Food Insecurity (11/27/2022)  Housing: Low Risk  (11/27/2022)  Transportation Needs: No Transportation Needs (11/27/2022)  Utilities: Not At Risk (11/27/2022)  Tobacco Use: Low Risk  (11/27/2022)     Readmission Risk Interventions     No data to display

## 2022-11-28 NOTE — Progress Notes (Signed)
Physical Therapy Treatment Patient Details Name: Peter Stuart MRN: [id number] DOB: 10/26/1946 Today's Date: 11/28/2022   History of Present Illness 77 y/o with L hip pain post fall.  Dx with impending pathological hip fracture 2/2 Metastatic Renal cell Ca. Pt is now S/P Intramodullary Nail in Femur. WBAT to LLE    PT Comments    Pt eager to work with PT and try a prolonged bout of ambulation.  Ultimately he showed ability to safely ambulate >200 ft with walker and relatively consistent cadence.  Pt does have some L limp but no overt safety issues, minimal fatigue.  Discussed appropriate home management strategies and answered questions in this regard.  Review of HEP and likely course of recovery.  Pt is safe to return home from PT stand-point - did encourage him to insure he is not too aggressive in managing the home and calling family/friends to help as he recovers.    Recommendations for follow up therapy are one component of a multi-disciplinary discharge planning process, led by the attending physician.  Recommendations may be updated based on patient status, additional functional criteria and insurance authorization.  Follow Up Recommendations  Home health PT     Assistance Recommended at Discharge Intermittent Supervision/Assistance  Patient can return home with the following A little help with walking and/or transfers;A little help with bathing/dressing/bathroom;Assistance with cooking/housework;Assist for transportation   Equipment Recommendations  None recommended by PT    Recommendations for Other Services       Precautions / Restrictions Precautions Precautions: Fall Restrictions LLE Weight Bearing: Weight bearing as tolerated     Mobility  Bed Mobility Overal bed mobility: Modified Independent             General bed mobility comments: Pt needed UEs to help L LE to EOB, but able to transition to sitting w/o direct assist    Transfers Overall transfer level:  Modified independent Equipment used: Rolling walker (2 wheels) Transfers: Sit to/from Stand Sit to Stand: Supervision           General transfer comment: Cuing for appropriate set up and sequencing, did raise bed ~3" (he has and sleeps in lift chair at baseline).  CGA only with walker.    Ambulation/Gait Ambulation/Gait assistance: Min guard Gait Distance (Feet): 200 Feet Assistive device: Rolling walker (2 wheels)         General Gait Details: Pt showed good effort and increasing confidence with increased distance.  Pt initially with noted limp on the L but able to work with cadence and how much weight through AD/UEs. Pt with no LOBs, consistent cuing t/o the effort.   Stairs             Wheelchair Mobility    Modified Rankin (Stroke Patients Only)       Balance Overall balance assessment: Needs assistance   Sitting balance-Leahy Scale: Normal     Standing balance support: Bilateral upper extremity supported Standing balance-Leahy Scale: Good                              Cognition Arousal/Alertness: Awake/alert Behavior During Therapy: WFL for tasks assessed/performed Overall Cognitive Status: Within Functional Limits for tasks assessed                                          Exercises  General Comments        Pertinent Vitals/Pain Pain Assessment Pain Assessment: 0-10 Pain Score: 7  Pain Location: L hip    Home Living                          Prior Function            PT Goals (current goals can now be found in the care plan section) Progress towards PT goals: Progressing toward goals    Frequency    7X/week      PT Plan Current plan remains appropriate    Co-evaluation              AM-PAC PT "6 Clicks" Mobility   Outcome Measure  Help needed turning from your back to your side while in a flat bed without using bedrails?: None Help needed moving from lying on your back to  sitting on the side of a flat bed without using bedrails?: None Help needed moving to and from a bed to a chair (including a wheelchair)?: None Help needed standing up from a chair using your arms (e.g., wheelchair or bedside chair)?: A Little Help needed to walk in hospital room?: None Help needed climbing 3-5 steps with a railing? : A Little 6 Click Score: 22    End of Session Equipment Utilized During Treatment: Gait belt Activity Tolerance: Patient tolerated treatment well Patient left: in chair;with call bell/phone within reach Nurse Communication: Mobility status PT Visit Diagnosis: Other abnormalities of gait and mobility (R26.89);Muscle weakness (generalized) (M62.81);Difficulty in walking, not elsewhere classified (R26.2)     Time: 8295-6213 PT Time Calculation (min) (ACUTE ONLY): 25 min  Charges:  $Gait Training: 8-22 mins $Therapeutic Exercise: 8-22 mins                     Kreg Shropshire, DPT 11/28/2022, 12:43 PM

## 2022-11-29 ENCOUNTER — Encounter: Payer: Self-pay | Admitting: Oncology

## 2022-12-16 ENCOUNTER — Encounter: Payer: Self-pay | Admitting: Oncology

## 2022-12-16 ENCOUNTER — Inpatient Hospital Stay: Payer: Medicare Other | Attending: Oncology

## 2022-12-16 ENCOUNTER — Inpatient Hospital Stay: Payer: Medicare Other

## 2022-12-16 ENCOUNTER — Inpatient Hospital Stay (HOSPITAL_BASED_OUTPATIENT_CLINIC_OR_DEPARTMENT_OTHER): Payer: Medicare Other | Admitting: Oncology

## 2022-12-16 VITALS — BP 123/84 | HR 90 | Temp 98.7°F | Resp 18 | Wt 228.0 lb

## 2022-12-16 DIAGNOSIS — N1832 Chronic kidney disease, stage 3b: Secondary | ICD-10-CM | POA: Diagnosis not present

## 2022-12-16 DIAGNOSIS — Z5112 Encounter for antineoplastic immunotherapy: Secondary | ICD-10-CM | POA: Diagnosis not present

## 2022-12-16 DIAGNOSIS — C642 Malignant neoplasm of left kidney, except renal pelvis: Secondary | ICD-10-CM

## 2022-12-16 DIAGNOSIS — C7951 Secondary malignant neoplasm of bone: Secondary | ICD-10-CM

## 2022-12-16 DIAGNOSIS — Z79620 Long term (current) use of immunosuppressive biologic: Secondary | ICD-10-CM | POA: Insufficient documentation

## 2022-12-16 DIAGNOSIS — G893 Neoplasm related pain (acute) (chronic): Secondary | ICD-10-CM

## 2022-12-16 LAB — CBC WITH DIFFERENTIAL/PLATELET
Abs Immature Granulocytes: 0.03 10*3/uL (ref 0.00–0.07)
Basophils Absolute: 0 10*3/uL (ref 0.0–0.1)
Basophils Relative: 1 %
Eosinophils Absolute: 0.6 10*3/uL — ABNORMAL HIGH (ref 0.0–0.5)
Eosinophils Relative: 9 %
HCT: 37.1 % — ABNORMAL LOW (ref 39.0–52.0)
Hemoglobin: 12.6 g/dL — ABNORMAL LOW (ref 13.0–17.0)
Immature Granulocytes: 1 %
Lymphocytes Relative: 10 %
Lymphs Abs: 0.6 10*3/uL — ABNORMAL LOW (ref 0.7–4.0)
MCH: 31.3 pg (ref 26.0–34.0)
MCHC: 34 g/dL (ref 30.0–36.0)
MCV: 92.3 fL (ref 80.0–100.0)
Monocytes Absolute: 0.9 10*3/uL (ref 0.1–1.0)
Monocytes Relative: 13 %
Neutro Abs: 4.5 10*3/uL (ref 1.7–7.7)
Neutrophils Relative %: 66 %
Platelets: 179 10*3/uL (ref 150–400)
RBC: 4.02 MIL/uL — ABNORMAL LOW (ref 4.22–5.81)
RDW: 13.8 % (ref 11.5–15.5)
WBC: 6.6 10*3/uL (ref 4.0–10.5)
nRBC: 0 % (ref 0.0–0.2)

## 2022-12-16 LAB — COMPREHENSIVE METABOLIC PANEL
ALT: 44 U/L (ref 0–44)
AST: 29 U/L (ref 15–41)
Albumin: 4 g/dL (ref 3.5–5.0)
Alkaline Phosphatase: 55 U/L (ref 38–126)
Anion gap: 12 (ref 5–15)
BUN: 25 mg/dL — ABNORMAL HIGH (ref 8–23)
CO2: 25 mmol/L (ref 22–32)
Calcium: 8.9 mg/dL (ref 8.9–10.3)
Chloride: 98 mmol/L (ref 98–111)
Creatinine, Ser: 1.51 mg/dL — ABNORMAL HIGH (ref 0.61–1.24)
GFR, Estimated: 47 mL/min — ABNORMAL LOW (ref >60)
Glucose, Bld: 167 mg/dL — ABNORMAL HIGH (ref 70–99)
Potassium: 3.7 mmol/L (ref 3.5–5.1)
Sodium: 135 mmol/L (ref 135–145)
Total Bilirubin: 0.5 mg/dL (ref 0.3–1.2)
Total Protein: 7 g/dL (ref 6.5–8.1)

## 2022-12-16 LAB — TSH: TSH: 1.448 u[IU]/mL (ref 0.350–4.500)

## 2022-12-16 LAB — T4, FREE: Free T4: 0.87 ng/dL (ref 0.61–1.12)

## 2022-12-16 MED ORDER — SODIUM CHLORIDE 0.9 % IV SOLN
200.0000 mg | Freq: Once | INTRAVENOUS | Status: AC
  Administered 2022-12-16: 200 mg via INTRAVENOUS
  Filled 2022-12-16: qty 200

## 2022-12-16 MED ORDER — SODIUM CHLORIDE 0.9% FLUSH
10.0000 mL | INTRAVENOUS | Status: DC | PRN
  Filled 2022-12-16: qty 10

## 2022-12-16 MED ORDER — SODIUM CHLORIDE 0.9 % IV SOLN
Freq: Once | INTRAVENOUS | Status: AC
  Filled 2022-12-16: qty 250

## 2022-12-16 MED ORDER — HEPARIN SOD (PORK) LOCK FLUSH 100 UNIT/ML IV SOLN
500.0000 [IU] | Freq: Once | INTRAVENOUS | Status: DC | PRN
  Filled 2022-12-16: qty 5

## 2022-12-16 NOTE — Assessment & Plan Note (Signed)
Treatment plan listed above.  Monitor immunotherapy side effects

## 2022-12-16 NOTE — Assessment & Plan Note (Signed)
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 Biopsy of L2 + metastatic carcinoma, stage IV, limited disease burden.  Currently on palliative immunotherapy Labs are reviewed and discussed with patient. Proceed with Keytruda.

## 2022-12-16 NOTE — Progress Notes (Signed)
Pt hear for follow up. Pt reports he had a near fall this past weekend and hurt his leg.

## 2022-12-16 NOTE — Assessment & Plan Note (Signed)
Encourage oral hydration, avoid nephrotoxins

## 2022-12-16 NOTE — Assessment & Plan Note (Signed)
continue Tylenol 63m PRN 4h, he has oxycodone Rx if pain is not controlled with Tylenol

## 2022-12-16 NOTE — Progress Notes (Signed)
Hematology/Oncology Progress note [phone] [phone]   CHIEF COMPLAINTS/REASON FOR VISIT:  RCC   ASSESSMENT & PLAN:   Cancer Staging  Cancer of kidney, left [facility]) Staging form: Kidney, AJCC 8th Edition - Clinical stage from 06/25/2021: Stage III (cT3a, cNX, cM0) - Signed by [name], MD on 08/06/2021   Cancer of kidney, left [facility]) History of stage III left RCC, Lytic bone lesion of L2 and intertrochanteric left femur, consistent with osseous metastasis. Biopsy of L2 + metastatic carcinoma, stage IV, limited disease burden.  Currently on palliative immunotherapy Labs are reviewed and discussed with patient. Proceed with Keytruda.    CKD (chronic kidney disease) stage 3, GFR 30-59 ml/min (HCC) Encourage oral hydration, avoid nephrotoxins  Encounter for antineoplastic immunotherapy Treatment plan listed above.  Monitor immunotherapy side effects  Metastasis to bone [facility]) S/p  SBRT to L2 and left femur.  Follow up with orthopedic surgeon - s/p Left IM nail femur surgery.  Once he healed after the surgery, consider starting bisphosphonate   Neoplasm related pain continue Tylenol 692m PRN 4h, he has oxycodone Rx if pain is not controlled with Tylenol   Orders Placed This Encounter  Procedures   T4, free    Standing Status:   Future    Standing Expiration Date:   01/07/2024   CBC with Differential    Standing Status:   Future    Standing Expiration Date:   01/07/2024   Comprehensive metabolic panel    Standing Status:   Future    Standing Expiration Date:   01/07/2024   TSH    Standing Status:   Future    Standing Expiration Date:   01/07/2024   Follow up 3 weeks lab MD Keytruda All questions were answered. The patient knows to call the clinic with any problems, questions or concerns.  ZEarlie Server MD, PhD [facility] 12/16/2022    HISTORY OF PRESENTING ILLNESS:   Peter Stuart a  77y.o.  male presents for follow-up of  history of left RCC and history of prostate cancer. Oncology history summary listed as below. Oncology History  [place]   History of prostate cancer treated with robotic prostatectomy initial   04/27/2021 Imaging   CT abdomen pelvis showed acute appendicitis without evidence of perforation or abscess.  Large left renal mass consistent with renal malignancy with renal vein invasion.  Focal annular thickening of descending colon.  05/01/2021 CT chest with contrast showed large sliding-type hiatal hernia.  Mild bilateral posterior bibasilar subsegmental atelectasis   06/25/2021 Initial Diagnosis   Cancer of kidney, left  06/25/2021, patient underwent radical nephrectomy by Dr. SDiamantina [facility] Pathology showed clear-cell renal cell carcinoma, nuclear grade 2.  Benign adrenal tissue.  Lymphovascular invasion present. pT3a pNx    06/25/2021 Cancer Staging   Staging form: Kidney, AJCC 8th Edition - Clinical stage from 06/25/2021: Stage III (cT3a, cNX, cM0) - Signed by [name] MD on 08/06/2021 Stage prefix: Initial diagnosis   05/29/2022 Imaging   CT chest abdomen pelvis with contrast showed -1. New lytic lesions in the L2 vertebral body and intertrochanteric left femur are most consistent with osseous metastatic disease. 2. Prior left nephrectomy with unchanged size of the soft tissue and fluid collection in the nephrectomy bed favored to reflect postsurgical change given stability. Continued attention on follow-up imaging suggested. 3. Small lucent focus in the T11 vertebral body is nonspecific but stable from prior imaging suggestive of a benign  etiology, attention on follow-up imaging suggested. 4. Stable small bilateral pulmonary nodules, continued attention on follow-up imaging suggested. 5. Large hiatal hernia/intrathoracic stomach also containing a portion of the pancreatic body. 6. Prior prostatectomy without suspicious enhancing nodularity in the  prostatectomy bed.   07/15/2022 Procedure   L2 vertebral body CT guided biopsy showed positive for malignancy, metastatic carcinoma, morphologically compatible with known clear cell renal cell carcinoma.    07/19/2022 -  Chemotherapy   RENAL CELL Pembrolizumab (200)       INTERVAL HISTORY Peter Stuart is a 77 y.o. male who has above history reviewed by me today presents for follow up visit for metastatic RCC S/p SBRT to left femur, as well as L2 S/p left intramedullary nail placement by orthopedic surgeon.  He tolerated procedure well. He had a near fall episode after his surgery and has had increased left femur pain.  He was seen by orthopedic surgeon and per patient he has had images done which showed no dislocation of unwell or new fracture.  He was prescribed tramadol and a muscle relaxant. Otherwise he has no new complaints.   Review of Systems  Constitutional:  Negative for appetite change, chills, fever and unexpected weight change.  HENT:   Negative for hearing loss and voice change.   Eyes:  Negative for eye problems and icterus.  Respiratory:  Negative for chest tightness, cough and shortness of breath.   Cardiovascular:  Negative for chest pain and leg swelling.  Gastrointestinal:  Negative for abdominal distention and abdominal pain.  Endocrine: Negative for hot flashes.  Genitourinary:  Negative for difficulty urinating, dysuria and frequency.   Musculoskeletal:  Positive for back pain. Negative for arthralgias.       Left hip pain  Skin:  Negative for itching.  Neurological:  Negative for light-headedness and numbness.  Hematological:  Negative for adenopathy. Does not bruise/bleed easily.  Psychiatric/Behavioral:  Negative for confusion.     MEDICAL HISTORY:  Past Medical History:  Diagnosis Date   Anemia    Cancer ([place])    Cancer of kidney ([place])    CKD (chronic kidney disease) stage 3, GFR 30-59 ml/min (HCC)    Complication of anesthesia    slow to wake after  1 surgery   CPAP use counseling    Diabetes mellitus without complication (HCC)    HLD (hyperlipidemia)    HTN (hypertension)    OSA on CPAP    PONV (postoperative nausea and vomiting)    Prostate cancer ([place])    Seasonal allergies    Skin cancer     SURGICAL HISTORY: Past Surgical History:  Procedure Laterality Date   FEMUR IM NAIL Left 11/26/2022   Procedure: LEFT INTRAMEDULLARY (IM) NAIL FEMORAL;  Surgeon: Lovell Sheehan, MD;  Location: [facility] ORS;  Service: Orthopedics;  Laterality: Left;   HERNIA REPAIR     IR FLUORO GUIDED NEEDLE PLC ASPIRATION/INJECTION LOC  07/02/2022   LAPAROSCOPIC APPENDECTOMY N/A 04/27/2021   Procedure: APPENDECTOMY LAPAROSCOPIC;  Surgeon: Fredirick Maudlin, MD;  Location: [facility] ORS;  Service: General;  Laterality: N/A;   LAPAROSCOPIC NEPHRECTOMY, HAND ASSISTED Left 06/25/2021   Procedure: HAND ASSISTED LAPAROSCOPIC NEPHRECTOMY;  Surgeon: Billey Co, MD;  Location: [facility] ORS;  Service: Urology;  Laterality: Left;   prostatectomy     REPAIR KNEE LIGAMENT     SHOULDER ARTHROSCOPY WITH SUBACROMIAL DECOMPRESSION AND OPEN ROTATOR C Right 12/29/2020   Procedure: Right shoulder arthroscopic rotator cuff repair, subacromial decompression, and biceps tenodesis;  Surgeon: Leim Fabry,  MD;  Location: [place];  Service: Orthopedics;  Laterality: Right;   TONSILLECTOMY     VASECTOMY      SOCIAL HISTORY: Social History   Socioeconomic History   Marital status: Widowed    Spouse name: Not on file   Number of children: Not on file   Years of education: Not on file   Highest education level: Not on file  Occupational History   Not on file  Tobacco Use   Smoking status: Never    Passive exposure: Never   Smokeless tobacco: Never  Vaping Use   Vaping Use: Never used  Substance and Sexual Activity   Alcohol use: Not Currently   Drug use: Never   Sexual activity: Not Currently  Other Topics Concern   Not on file  Social History Narrative   Not on  file   Social Determinants of Health   Financial Resource Strain: Not on file  Food Insecurity: No Food Insecurity (11/27/2022)   Hunger Vital Sign    Worried About Running Out of Food in the Last Year: Never true    Ran Out of Food in the Last Year: Never true  Transportation Needs: No Transportation Needs (11/27/2022)   PRAPARE - Hydrologist (Medical): No    Lack of Transportation (Non-Medical): No  Physical Activity: Not on file  Stress: Not on file  Social Connections: Not on file  Intimate Partner Violence: Not At Risk (11/27/2022)   Humiliation, Afraid, Rape, and Kick questionnaire    Fear of Current or Ex-Partner: No    Emotionally Abused: No    Physically Abused: No    Sexually Abused: No    FAMILY HISTORY: Family History  Problem Relation Age of Onset   Stroke Mother    Hypertension Father    Emphysema Father     ALLERGIES:  has No Known Allergies.  MEDICATIONS:  Current Outpatient Medications  Medication Sig Dispense Refill   ALLEGRA ALLERGY 60 MG tablet Take 60 mg by mouth daily.     cholecalciferol (VITAMIN D3) 25 MCG (1000 UNIT) tablet Take 1,000 Units by mouth daily.     clotrimazole (LOTRIMIN) 1 % cream Apply topically 2 (two) times daily.     Coenzyme Q10 10 MG capsule Take 10 mg by mouth daily.     diphenhydrAMINE-Zinc Acetate (BENADRYL ITCH RELIEF EX) Apply topically.     ezetimibe (ZETIA) 10 MG tablet Take 1 tablet by mouth daily.     fluticasone (FLONASE) 50 MCG/ACT nasal spray Place 2 sprays into both nostrils daily.     gabapentin (NEURONTIN) 600 MG tablet Take 600 mg by mouth at bedtime.     GINKGO BILOBA COMPLEX PO Take 1 tablet by mouth daily.     hydrochlorothiazide (HYDRODIURIL) 25 MG tablet Take 1 tablet by mouth daily.     losartan (COZAAR) 100 MG tablet Take 100 mg by mouth daily.     methocarbamol (ROBAXIN) 500 MG tablet Take 500 mg by mouth daily as needed.     Multiple Vitamin (MULTIVITAMIN) capsule Take 1  capsule by mouth daily.     multivitamin-lutein (OCUVITE-LUTEIN) CAPS capsule Take 1 capsule by mouth daily.     Omega-3 Fatty Acids (FISH OIL) 1000 MG CAPS Take 1 capsule by mouth 1 day or 1 dose.     Pembrolizumab (KEYTRUDA IV) Inject into the vein.     terbinafine (LAMISIL) 1 % cream Apply 1 application topically daily as needed (irritation).  triamcinolone cream (KENALOG) 0.1 % Apply 1 Application topically 2 (two) times daily.     acetaminophen (TYLENOL) 650 MG suppository Place 650 mg rectally every 6 (six) hours as needed. (Patient not taking: Reported on 12/16/2022)     oxyCODONE (OXY IR/ROXICODONE) 5 MG immediate release tablet Take 1 tablet (5 mg total) by mouth every 6 (six) hours as needed for severe pain. (Patient not taking: Reported on 12/16/2022) 60 tablet 0   No current facility-administered medications for this visit.   Facility-Administered Medications Ordered in Other Visits  Medication Dose Route Frequency Provider Last Rate Last Admin   heparin lock flush 100 unit/mL  500 Units Intracatheter Once PRN [name], MD       sodium chloride flush (NS) 0.9 % injection 10 mL  10 mL Intracatheter PRN [name], MD         PHYSICAL EXAMINATION: ECOG PERFORMANCE STATUS: 1 - Symptomatic but completely ambulatory Vitals:   12/16/22 1432  BP: 123/84  Pulse: 90  Resp: 18  Temp: 98.7 F (37.1 C)   Filed Weights   12/16/22 1432  Weight: 228 lb (103.4 kg)    Physical Exam Constitutional:      General: He is not in acute distress.    Appearance: He is obese.  HENT:     Head: Normocephalic and atraumatic.  Eyes:     General: No scleral icterus. Cardiovascular:     Rate and Rhythm: Normal rate.     Heart sounds: Normal heart sounds.  Pulmonary:     Effort: Pulmonary effort is normal. No respiratory distress.     Breath sounds: No wheezing.  Abdominal:     General: Bowel sounds are normal. There is no distension.     Palpations: Abdomen is soft.  Musculoskeletal:         General: No deformity. Normal range of motion.     Cervical back: Normal range of motion and neck supple.  Skin:    General: Skin is warm and dry.     Findings: No erythema.  Neurological:     Mental Status: He is alert and oriented to person, place, and time. Mental status is at baseline.     Cranial Nerves: No cranial nerve deficit.     Coordination: Coordination normal.  Psychiatric:        Mood and Affect: Mood normal.     LABORATORY DATA:  I have reviewed the data as listed    Latest Ref Rng & Units 12/16/2022    2:13 PM 11/27/2022    5:03 AM 11/25/2022    1:06 PM  CBC  WBC 4.0 - 10.5 K/uL 6.6   5.0   Hemoglobin 13.0 - 17.0 g/dL 12.6  12.0  13.9   Hematocrit 39.0 - 52.0 % 37.1  35.0  40.8   Platelets 150 - 400 K/uL 179   154       Latest Ref Rng & Units 12/16/2022    2:13 PM 11/25/2022    1:06 PM 11/22/2022    3:05 PM  CMP  Glucose 70 - 99 mg/dL 167  143  117   BUN 8 - 23 mg/dL 25  27  27   $ Creatinine 0.61 - 1.24 mg/dL 1.51  1.45  1.41   Sodium 135 - 145 mmol/L 135  135  135   Potassium 3.5 - 5.1 mmol/L 3.7  3.7  3.9   Chloride 98 - 111 mmol/L 98  99  103   CO2 22 -  32 mmol/L 25  25  20   $ Calcium 8.9 - 10.3 mg/dL 8.9  9.0  9.3   Total Protein 6.5 - 8.1 g/dL 7.0  6.7    Total Bilirubin 0.3 - 1.2 mg/dL 0.5  0.4    Alkaline Phos 38 - 126 U/L 55  38    AST 15 - 41 U/L 29  23    ALT 0 - 44 U/L 44  33      RADIOGRAPHIC STUDIES: I have personally reviewed the radiological images as listed and agreed with the findings in the report. DG FEMUR MIN 2 VIEWS LEFT  Result Date: 11/26/2022 CLINICAL DATA:  Surgery, elective EXAM: LEFT FEMUR 2 VIEWS COMPARISON:  Left hip MRI 10/01/2022 FINDINGS: Intraoperative images during prophylactic intramedullary nail fixation of the femur with known underlying metastasis at the intertrochanteric region. No evidence of immediate complication. IMPRESSION: Intraoperative images during intramedullary nail fixation of the femur with known  underlying intertrochanteric region metastasis. No evidence of immediate complication. Electronically Signed   By: Maurine Simmering M.D.   On: 11/26/2022 17:00   DG C-Arm 1-60 Min-No Report  Result Date: 11/26/2022 Fluoroscopy was utilized by the requesting physician.  No radiographic interpretation.   DG C-Arm 1-60 Min-No Report  Result Date: 11/26/2022 Fluoroscopy was utilized by the requesting physician.  No radiographic interpretation.

## 2022-12-16 NOTE — Assessment & Plan Note (Addendum)
S/p  SBRT to L2 and left femur.  Follow up with orthopedic surgeon - s/p Left IM nail femur surgery.  Once he healed after the surgery, consider starting bisphosphonate

## 2022-12-26 ENCOUNTER — Encounter: Payer: Self-pay | Admitting: Radiation Oncology

## 2022-12-26 ENCOUNTER — Ambulatory Visit
Admission: RE | Admit: 2022-12-26 | Discharge: 2022-12-26 | Disposition: A | Discharge status: Home / Self Care | Payer: Medicare Other | Source: Ambulatory Visit | Attending: Radiation Oncology | Admitting: Radiation Oncology

## 2022-12-26 VITALS — BP 115/72 | HR 73 | Temp 98.6°F | Resp 20

## 2022-12-26 DIAGNOSIS — M79604 Pain in right leg: Secondary | ICD-10-CM | POA: Insufficient documentation

## 2022-12-26 DIAGNOSIS — C642 Malignant neoplasm of left kidney, except renal pelvis: Secondary | ICD-10-CM | POA: Insufficient documentation

## 2022-12-26 DIAGNOSIS — C7951 Secondary malignant neoplasm of bone: Secondary | ICD-10-CM | POA: Diagnosis present

## 2022-12-26 DIAGNOSIS — Z923 Personal history of irradiation: Secondary | ICD-10-CM | POA: Diagnosis not present

## 2022-12-26 NOTE — Progress Notes (Signed)
Radiation Oncology Follow up Note  Name: Peter Stuart   Date:   12/26/2022 MRN:  [id number] DOB: [birth year]    This 77 y.o. male presents to the clinic today for 1 month follow-up status post radiation therapy to his L-spine as well as left femoral shaft and patient with known stage IV renal cell carcinoma.  REFERRING PROVIDER: Dion Body, MD  HPI: Patient is a 77 year old male now 1 month out from palliative radiation therapy to his L-spine as well as left femoral shaft for stage IV renal cell carcinoma.  He underwent surgical pinning of his.  Left femur.  He is currently on Keytruda tolerating it fairly well.  Does continue have some back pain although it is improving.  Also right leg pain although surgery was recent.  He is having no other areas of pain at this time.  COMPLICATIONS OF TREATMENT: none  FOLLOW UP COMPLIANCE: keeps appointments   PHYSICAL EXAM:  BP 115/72 (BP Location: Left Arm, Patient Position: Sitting, Cuff Size: Normal)   Pulse 73   Temp 98.6 F (37 C) (Tympanic)   Resp 43  Wheelchair-bound male in NAD.  Range of motion is lower extremities shows some left hip pain on rotation.  Deep palpation of the spine does not elicit pain.  Motor and sensory levels in the lower extremities are equal and symmetric.  Well-developed well-nourished patient in NAD. HEENT reveals PERLA, EOMI, discs not visualized.  Oral cavity is clear. No oral mucosal lesions are identified. Neck is clear without evidence of cervical or supraclavicular adenopathy. Lungs are clear to A&P. Cardiac examination is essentially unremarkable with regular rate and rhythm without murmur rub or thrill. Abdomen is benign with no organomegaly or masses noted. Motor sensory and DTR levels are equal and symmetric in the upper and lower extremities. Cranial nerves II through XII are grossly intact. Proprioception is intact. No peripheral adenopathy or edema is identified. No motor or sensory levels are noted.  Crude visual fields are within normal range.  RADIOLOGY RESULTS: No current films for review  PLAN: Present turn follow-up care over to medical oncology.  He continues on immunotherapy with Keytruda.  Patient knows to call with any concerns to be happy to reevaluate him should that be needed.  I would like to take this opportunity to thank you for allowing me to participate in the care of your patient.Noreene Filbert, MD

## 2023-01-06 ENCOUNTER — Inpatient Hospital Stay: Payer: Medicare Other | Attending: Oncology

## 2023-01-06 ENCOUNTER — Inpatient Hospital Stay (HOSPITAL_BASED_OUTPATIENT_CLINIC_OR_DEPARTMENT_OTHER): Payer: Medicare Other | Admitting: Oncology

## 2023-01-06 ENCOUNTER — Inpatient Hospital Stay: Payer: Medicare Other

## 2023-01-06 ENCOUNTER — Encounter: Payer: Self-pay | Admitting: Oncology

## 2023-01-06 VITALS — BP 112/76 | HR 70 | Temp 97.3°F | Resp 18 | Wt 232.7 lb

## 2023-01-06 DIAGNOSIS — C642 Malignant neoplasm of left kidney, except renal pelvis: Secondary | ICD-10-CM | POA: Diagnosis present

## 2023-01-06 DIAGNOSIS — C7951 Secondary malignant neoplasm of bone: Secondary | ICD-10-CM

## 2023-01-06 DIAGNOSIS — G893 Neoplasm related pain (acute) (chronic): Secondary | ICD-10-CM

## 2023-01-06 DIAGNOSIS — Z5112 Encounter for antineoplastic immunotherapy: Secondary | ICD-10-CM

## 2023-01-06 DIAGNOSIS — Z79620 Long term (current) use of immunosuppressive biologic: Secondary | ICD-10-CM | POA: Insufficient documentation

## 2023-01-06 DIAGNOSIS — N1832 Chronic kidney disease, stage 3b: Secondary | ICD-10-CM

## 2023-01-06 DIAGNOSIS — T80818A Extravasation of other vesicant agent, initial encounter: Secondary | ICD-10-CM

## 2023-01-06 LAB — CBC WITH DIFFERENTIAL/PLATELET
Abs Immature Granulocytes: 0.04 10*3/uL (ref 0.00–0.07)
Basophils Absolute: 0.1 10*3/uL (ref 0.0–0.1)
Basophils Relative: 1 %
Eosinophils Absolute: 0.7 10*3/uL — ABNORMAL HIGH (ref 0.0–0.5)
Eosinophils Relative: 12 %
HCT: 37.8 % — ABNORMAL LOW (ref 39.0–52.0)
Hemoglobin: 13 g/dL (ref 13.0–17.0)
Immature Granulocytes: 1 %
Lymphocytes Relative: 10 %
Lymphs Abs: 0.6 10*3/uL — ABNORMAL LOW (ref 0.7–4.0)
MCH: 31.5 pg (ref 26.0–34.0)
MCHC: 34.4 g/dL (ref 30.0–36.0)
MCV: 91.5 fL (ref 80.0–100.0)
Monocytes Absolute: 0.8 10*3/uL (ref 0.1–1.0)
Monocytes Relative: 12 %
Neutro Abs: 4.2 10*3/uL (ref 1.7–7.7)
Neutrophils Relative %: 64 %
Platelets: 172 10*3/uL (ref 150–400)
RBC: 4.13 MIL/uL — ABNORMAL LOW (ref 4.22–5.81)
RDW: 13.2 % (ref 11.5–15.5)
WBC: 6.5 10*3/uL (ref 4.0–10.5)
nRBC: 0 % (ref 0.0–0.2)

## 2023-01-06 LAB — TSH: TSH: 1.686 u[IU]/mL (ref 0.350–4.500)

## 2023-01-06 LAB — T4, FREE: Free T4: 0.94 ng/dL (ref 0.61–1.12)

## 2023-01-06 LAB — COMPREHENSIVE METABOLIC PANEL
ALT: 26 U/L (ref 0–44)
AST: 23 U/L (ref 15–41)
Albumin: 4 g/dL (ref 3.5–5.0)
Alkaline Phosphatase: 50 U/L (ref 38–126)
Anion gap: 8 (ref 5–15)
BUN: 30 mg/dL — ABNORMAL HIGH (ref 8–23)
CO2: 27 mmol/L (ref 22–32)
Calcium: 9.4 mg/dL (ref 8.9–10.3)
Chloride: 100 mmol/L (ref 98–111)
Creatinine, Ser: 1.52 mg/dL — ABNORMAL HIGH (ref 0.61–1.24)
GFR, Estimated: 47 mL/min — ABNORMAL LOW (ref >60)
Glucose, Bld: 107 mg/dL — ABNORMAL HIGH (ref 70–99)
Potassium: 3.2 mmol/L — ABNORMAL LOW (ref 3.5–5.1)
Sodium: 135 mmol/L (ref 135–145)
Total Bilirubin: 0.5 mg/dL (ref 0.3–1.2)
Total Protein: 6.7 g/dL (ref 6.5–8.1)

## 2023-01-06 MED ORDER — SODIUM CHLORIDE 0.9% FLUSH
10.0000 mL | INTRAVENOUS | Status: DC | PRN
  Filled 2023-01-06: qty 10

## 2023-01-06 MED ORDER — HEPARIN SOD (PORK) LOCK FLUSH 100 UNIT/ML IV SOLN
500.0000 [IU] | Freq: Once | INTRAVENOUS | Status: DC | PRN
  Filled 2023-01-06: qty 5

## 2023-01-06 MED ORDER — SODIUM CHLORIDE 0.9 % IV SOLN
Freq: Once | INTRAVENOUS | Status: AC
  Filled 2023-01-06: qty 250

## 2023-01-06 MED ORDER — SODIUM CHLORIDE 0.9 % IV SOLN
200.0000 mg | Freq: Once | INTRAVENOUS | Status: AC
  Administered 2023-01-06: 200 mg via INTRAVENOUS
  Filled 2023-01-06: qty 8

## 2023-01-06 NOTE — Assessment & Plan Note (Signed)
continue Tylenol 650mg PRN 4h, he has oxycodone Rx if pain is not controlled with Tylenol  

## 2023-01-06 NOTE — Assessment & Plan Note (Signed)
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 Biopsy of L2 + metastatic carcinoma, stage IV, limited disease burden.  Currently on palliative immunotherapy Labs are reviewed and discussed with patient. Proceed with Keytruda.   

## 2023-01-06 NOTE — Assessment & Plan Note (Signed)
Treatment plan listed above.  Monitor immunotherapy side effects 

## 2023-01-06 NOTE — Assessment & Plan Note (Signed)
Encourage oral hydration, avoid nephrotoxins 

## 2023-01-06 NOTE — Patient Instructions (Addendum)
Peter Stuart  Discharge Instructions: Thank you for choosing [facility] to provide your oncology and hematology care.  If you have a lab appointment with the [facility], please go directly to the [facility] and check in at the registration area.  Wear comfortable clothing and clothing appropriate for easy access to any Portacath or PICC line.   We strive to give you quality time with your provider. You may need to reschedule your appointment if you arrive late (15 or more minutes).  Arriving late affects you and other patients whose appointments are after yours.  Also, if you miss three or more appointments without notifying the office, you may be dismissed from the clinic at the provider's discretion.      For prescription refill requests, have your pharmacy contact our office and allow 72 hours for refills to be completed.    Today you received the following chemotherapy and/or immunotherapy agents- [name]      To help prevent nausea and vomiting after your treatment, we encourage you to take your nausea medication as directed.  BELOW ARE SYMPTOMS THAT SHOULD BE REPORTED IMMEDIATELY: *FEVER GREATER THAN 100.4 F (38 C) OR HIGHER *CHILLS OR SWEATING *NAUSEA AND VOMITING THAT IS NOT CONTROLLED WITH YOUR NAUSEA MEDICATION *UNUSUAL SHORTNESS OF BREATH *UNUSUAL BRUISING OR BLEEDING *URINARY PROBLEMS (pain or burning when urinating, or frequent urination) *BOWEL PROBLEMS (unusual diarrhea, constipation, pain near the anus) TENDERNESS IN MOUTH AND THROAT WITH OR WITHOUT PRESENCE OF ULCERS (sore throat, sores in mouth, or a toothache) UNUSUAL RASH, SWELLING OR PAIN  UNUSUAL VAGINAL DISCHARGE OR ITCHING   Items with * indicate a potential emergency and should be followed up as soon as possible or go to the Emergency Department if any problems should occur.  Please show the CHEMOTHERAPY ALERT CARD or IMMUNOTHERAPY ALERT CARD at check-in to  the Emergency Department and triage nurse.  Should you have questions after your visit or need to cancel or reschedule your appointment, please contact [facility]  [phone] and follow the prompts.  Office hours are 8:00 a.m. to 4:30 p.m. Monday - Friday. Please note that voicemails left after 4:00 p.m. may not be returned until the following business day.  We are closed weekends and major holidays. You have access to a nurse at all times for urgent questions. Please call the main number to the clinic [phone] and follow the prompts.  For any non-urgent questions, you may also contact your provider using MyChart. We now offer e-Visits for anyone 55 and older to request care online for non-urgent symptoms. For details visit [email or web address].   Also download the MyChart app! Go to the app store, search "MyChart", open the app, select [facility], and log in with your MyChart username and password.  [name]    [id number]   1946-08-31  Type of procedure: Keytruda Extravasation  01/06/2023    What should you do?  Apply ice 20 minutes four times a day for three days.  Keep the affected extremity elevated above the rest of your body for 48 hours.  If you develop any of the following signs or symptoms, please contact [facility] at [phone].  Increased pain Increasing swelling  Change in sensation (ex. Numbness, tingling) Development of redness or increase in warmth around the affected area Increasing hardness Blistering

## 2023-01-06 NOTE — Assessment & Plan Note (Addendum)
S/p  SBRT to L2 and left femur.  Follow up with orthopedic surgeon - s/p Left IM nail femur surgery.  Discussed about the rationale and side effects for bisphosphonate  Awaiting dental clearance.

## 2023-01-06 NOTE — Assessment & Plan Note (Signed)
Patient had Keytruda extravasation incident, 5cm x4cm, IV was removed. I instructed RN to follow non vesicant extravasation protocol.

## 2023-01-06 NOTE — Progress Notes (Signed)
Hematology/Oncology Progress note [phone] Fax:([phone]) [phone]   CHIEF COMPLAINTS/REASON FOR VISIT:  RCC  ASSESSMENT & PLAN:   Cancer Staging  Cancer of kidney, left [facility]) Staging form: Kidney, AJCC 8th Edition - Clinical stage from 06/25/2021: Stage III (cT3a, cNX, cM0) - Signed by [name], MD on 08/06/2021   Cancer of kidney, left [facility]) History of stage III left RCC, Lytic bone lesion of L2 and intertrochanteric left femur, consistent with osseous metastasis. Biopsy of L2 + metastatic carcinoma, stage IV, limited disease burden.  Currently on palliative immunotherapy Labs are reviewed and discussed with patient. Proceed with Keytruda.    Metastasis to bone [facility]) S/p  SBRT to L2 and left femur.  Follow up with orthopedic surgeon - s/p Left IM nail femur surgery.  Discussed about the rationale and side effects for bisphosphonate  Awaiting dental clearance.   CKD (chronic kidney disease) stage 3, GFR 30-59 ml/min (HCC) Encourage oral hydration, avoid nephrotoxins  Encounter for antineoplastic immunotherapy Treatment plan listed above.  Monitor immunotherapy side effects  Neoplasm related pain continue Tylenol '650mg'$  PRN 4h, he has oxycodone Rx if pain is not controlled with Tylenol   Extravasation accident Patient had Keytruda extravasation incident, 5cm x4cm, IV was removed. I instructed RN to follow non vesicant extravasation protocol.     Orders Placed This Encounter  Procedures   CBC with Differential    Standing Status:   Future    Standing Expiration Date:   02/19/2024   Comprehensive metabolic panel    Standing Status:   Future    Standing Expiration Date:   02/19/2024   Follow up 3 weeks lab MD Keytruda All questions were answered. The patient knows to call the clinic with any problems, questions or concerns.  [name], MD, PhD [facility] Hematology Oncology 01/06/2023    HISTORY OF PRESENTING ILLNESS:   Peter Stuart is a  77 y.o.   male presents for follow-up of history of left RCC and history of prostate cancer. Oncology history summary listed as below. Oncology History  Cancer of kidney, left ([place])  2009 Initial Diagnosis   History of prostate cancer treated with robotic prostatectomy initial   04/27/2021 Imaging   CT abdomen pelvis showed acute appendicitis without evidence of perforation or abscess.  Large left renal mass consistent with renal malignancy with renal vein invasion.  Focal annular thickening of descending colon.  05/01/2021 CT chest with contrast showed large sliding-type hiatal hernia.  Mild bilateral posterior bibasilar subsegmental atelectasis   06/25/2021 Initial Diagnosis   Cancer of kidney, left  06/25/2021, patient underwent radical nephrectomy by Dr. Diamantina Providence. Pathology showed clear-cell renal cell carcinoma, nuclear grade 2.  Benign adrenal tissue.  Lymphovascular invasion present. pT3a pNx    06/25/2021 Cancer Staging   Staging form: Kidney, AJCC 8th Edition - Clinical stage from 06/25/2021: Stage III (cT3a, cNX, cM0) - Signed by [name], MD on 08/06/2021 Stage prefix: Initial diagnosis   05/29/2022 Imaging   CT chest abdomen pelvis with contrast showed -1. New lytic lesions in the L2 vertebral body and intertrochanteric left femur are most consistent with osseous metastatic disease. 2. Prior left nephrectomy with unchanged size of the soft tissue and fluid collection in the nephrectomy bed favored to reflect postsurgical change given stability. Continued attention on follow-up imaging suggested. 3. Small lucent focus in the T11 vertebral body is nonspecific but stable from prior imaging suggestive of a benign etiology, attention on follow-up imaging suggested. 4. Stable small bilateral pulmonary nodules, continued  attention on follow-up imaging suggested. 5. Large hiatal hernia/intrathoracic stomach also containing a portion of the pancreatic body. 6. Prior prostatectomy without suspicious  enhancing nodularity in the prostatectomy bed.   07/15/2022 Procedure   L2 vertebral body CT guided biopsy showed positive for malignancy, metastatic carcinoma, morphologically compatible with known clear cell renal cell carcinoma.    07/19/2022 -  Chemotherapy   RENAL CELL Pembrolizumab (200)      10/30/2022 - 11/18/2022 Radiation Therapy   Status post SBRT to both L2 as well as left femur.   11/26/2022 Surgery   Status post left intramedullary nail   Metastasis to bone ([place])  09/23/2022 Initial Diagnosis   Metastasis to bone [facility])    INTERVAL HISTORY Peter Stuart is a 77 y.o. male who has above history reviewed by me today presents for follow up visit for metastatic RCC He has some pain with moving of left lower extremity, improving, he takes tylenol PRN.    Review of Systems  Constitutional:  Negative for appetite change, chills, fever and unexpected weight change.  HENT:   Negative for hearing loss and voice change.   Eyes:  Negative for eye problems and icterus.  Respiratory:  Negative for chest tightness, cough and shortness of breath.   Cardiovascular:  Negative for chest pain and leg swelling.  Gastrointestinal:  Negative for abdominal distention and abdominal pain.  Endocrine: Negative for hot flashes.  Genitourinary:  Negative for difficulty urinating, dysuria and frequency.   Musculoskeletal:  Positive for back pain. Negative for arthralgias.       Left hip pain  Skin:  Negative for itching.  Neurological:  Negative for light-headedness and numbness.  Hematological:  Negative for adenopathy. Does not bruise/bleed easily.  Psychiatric/Behavioral:  Negative for confusion.     MEDICAL HISTORY:  Past Medical History:  Diagnosis Date   Anemia    Cancer ([place])    Cancer of kidney ([place])    CKD (chronic kidney disease) stage 3, GFR 30-59 ml/min (HCC)    Complication of anesthesia    slow to wake after 1 surgery   CPAP use counseling    Diabetes mellitus without  complication (HCC)    HLD (hyperlipidemia)    HTN (hypertension)    OSA on CPAP    PONV (postoperative nausea and vomiting)    Prostate cancer ([place])    Seasonal allergies    Skin cancer     SURGICAL HISTORY: Past Surgical History:  Procedure Laterality Date   FEMUR IM NAIL Left 11/26/2022   Procedure: LEFT INTRAMEDULLARY (IM) NAIL FEMORAL;  Surgeon: Lovell Sheehan, MD;  Location: [facility] ORS;  Service: Orthopedics;  Laterality: Left;   HERNIA REPAIR     IR FLUORO GUIDED NEEDLE PLC ASPIRATION/INJECTION LOC  07/02/2022   LAPAROSCOPIC APPENDECTOMY N/A 04/27/2021   Procedure: APPENDECTOMY LAPAROSCOPIC;  Surgeon: Fredirick Maudlin, MD;  Location: [facility] ORS;  Service: General;  Laterality: N/A;   LAPAROSCOPIC NEPHRECTOMY, HAND ASSISTED Left 06/25/2021   Procedure: HAND ASSISTED LAPAROSCOPIC NEPHRECTOMY;  Surgeon: Billey Co, MD;  Location: [facility] ORS;  Service: Urology;  Laterality: Left;   prostatectomy     REPAIR KNEE LIGAMENT     SHOULDER ARTHROSCOPY WITH SUBACROMIAL DECOMPRESSION AND OPEN ROTATOR C Right 12/29/2020   Procedure: Right shoulder arthroscopic rotator cuff repair, subacromial decompression, and biceps tenodesis;  Surgeon: Leim Fabry, MD;  Location: [facility];  Service: Orthopedics;  Laterality: Right;   TONSILLECTOMY     VASECTOMY      SOCIAL HISTORY:  Social History   Socioeconomic History   Marital status: Widowed    Spouse name: Not on file   Number of children: Not on file   Years of education: Not on file   Highest education level: Not on file  Occupational History   Not on file  Tobacco Use   Smoking status: Never    Passive exposure: Never   Smokeless tobacco: Never  Vaping Use   Vaping Use: Never used  Substance and Sexual Activity   Alcohol use: Not Currently   Drug use: Never   Sexual activity: Not Currently  Other Topics Concern   Not on file  Social History Narrative   Not on file   Social Determinants of Health   Financial Resource  Strain: Not on file  Food Insecurity: No Food Insecurity (11/27/2022)   Hunger Vital Sign    Worried About Running Out of Food in the Last Year: Never true    Ran Out of Food in the Last Year: Never true  Transportation Needs: No Transportation Needs (11/27/2022)   PRAPARE - Hydrologist (Medical): No    Lack of Transportation (Non-Medical): No  Physical Activity: Not on file  Stress: Not on file  Social Connections: Not on file  Intimate Partner Violence: Not At Risk (11/27/2022)   Humiliation, Afraid, Rape, and Kick questionnaire    Fear of Current or Ex-Partner: No    Emotionally Abused: No    Physically Abused: No    Sexually Abused: No    FAMILY HISTORY: Family History  Problem Relation Age of Onset   Stroke Mother    Hypertension Father    Emphysema Father     ALLERGIES:  has No Known Allergies.  MEDICATIONS:  Current Outpatient Medications  Medication Sig Dispense Refill   ALLEGRA ALLERGY 60 MG tablet Take 60 mg by mouth daily.     cholecalciferol (VITAMIN D3) 25 MCG (1000 UNIT) tablet Take 1,000 Units by mouth daily.     clotrimazole (LOTRIMIN) 1 % cream Apply topically 2 (two) times daily.     Coenzyme Q10 10 MG capsule Take 10 mg by mouth daily.     diphenhydrAMINE-Zinc Acetate (BENADRYL ITCH RELIEF EX) Apply topically.     ezetimibe (ZETIA) 10 MG tablet Take 1 tablet by mouth daily.     fluticasone (FLONASE) 50 MCG/ACT nasal spray Place 2 sprays into both nostrils daily.     gabapentin (NEURONTIN) 600 MG tablet Take 600 mg by mouth at bedtime.     GINKGO BILOBA COMPLEX PO Take 1 tablet by mouth daily.     hydrochlorothiazide (HYDRODIURIL) 25 MG tablet Take 1 tablet by mouth daily.     losartan (COZAAR) 100 MG tablet Take 100 mg by mouth daily.     methocarbamol (ROBAXIN) 500 MG tablet Take 500 mg by mouth daily as needed.     Multiple Vitamin (MULTIVITAMIN) capsule Take 1 capsule by mouth daily.     multivitamin-lutein (OCUVITE-LUTEIN)  CAPS capsule Take 1 capsule by mouth daily.     Omega-3 Fatty Acids (FISH OIL) 1000 MG CAPS Take 1 capsule by mouth 1 day or 1 dose.     terbinafine (LAMISIL) 1 % cream Apply 1 application topically daily as needed (irritation).     triamcinolone cream (KENALOG) 0.1 % Apply 1 Application topically 2 (two) times daily.     acetaminophen (TYLENOL) 650 MG suppository Place 650 mg rectally every 6 (six) hours as needed. (Patient not taking: Reported  on 01/06/2023)     oxyCODONE (OXY IR/ROXICODONE) 5 MG immediate release tablet Take 1 tablet (5 mg total) by mouth every 6 (six) hours as needed for severe pain. (Patient not taking: Reported on 12/16/2022) 60 tablet 0   Pembrolizumab (KEYTRUDA IV) Inject into the vein. (Patient not taking: Reported on 12/26/2022)     No current facility-administered medications for this visit.     PHYSICAL EXAMINATION: ECOG PERFORMANCE STATUS: 1 - Symptomatic but completely ambulatory Vitals:   01/06/23 0856  BP: 112/76  Pulse: 70  Resp: 18  Temp: (!) 97.3 F (36.3 C)  SpO2: 98%   Filed Weights   01/06/23 0856  Weight: 232 lb 11.2 oz (105.6 kg)    Physical Exam Constitutional:      General: He is not in acute distress.    Appearance: He is obese.  HENT:     Head: Normocephalic and atraumatic.  Eyes:     General: No scleral icterus. Cardiovascular:     Rate and Rhythm: Normal rate.     Heart sounds: Normal heart sounds.  Pulmonary:     Effort: Pulmonary effort is normal. No respiratory distress.     Breath sounds: No wheezing.  Abdominal:     General: Bowel sounds are normal. There is no distension.     Palpations: Abdomen is soft.  Musculoskeletal:        General: No deformity. Normal range of motion.     Cervical back: Normal range of motion and neck supple.  Skin:    General: Skin is warm and dry.     Findings: No erythema.  Neurological:     Mental Status: He is alert and oriented to person, place, and time. Mental status is at baseline.      Cranial Nerves: No cranial nerve deficit.     Coordination: Coordination normal.  Psychiatric:        Mood and Affect: Mood normal.     LABORATORY DATA:  I have reviewed the data as listed    Latest Ref Rng & Units 01/06/2023    8:45 AM 12/16/2022    2:13 PM 11/27/2022    5:03 AM  CBC  WBC 4.0 - 10.5 K/uL 6.5  6.6    Hemoglobin 13.0 - 17.0 g/dL 13.0  12.6  12.0   Hematocrit 39.0 - 52.0 % 37.8  37.1  35.0   Platelets 150 - 400 K/uL 172  179        Latest Ref Rng & Units 01/06/2023    8:45 AM 12/16/2022    2:13 PM 11/25/2022    1:06 PM  CMP  Glucose 70 - 99 mg/dL 107  167  143   BUN 8 - 23 mg/dL '30  25  27   '$ Creatinine 0.61 - 1.24 mg/dL 1.52  1.51  1.45   Sodium 135 - 145 mmol/L 135  135  135   Potassium 3.5 - 5.1 mmol/L 3.2  3.7  3.7   Chloride 98 - 111 mmol/L 100  98  99   CO2 22 - 32 mmol/L '27  25  25   '$ Calcium 8.9 - 10.3 mg/dL 9.4  8.9  9.0   Total Protein 6.5 - 8.1 g/dL 6.7  7.0  6.7   Total Bilirubin 0.3 - 1.2 mg/dL 0.5  0.5  0.4   Alkaline Phos 38 - 126 U/L 50  55  38   AST 15 - 41 U/L '23  29  23   '$ ALT 0 -  44 U/L 26  44  33     RADIOGRAPHIC STUDIES: I have personally reviewed the radiological images as listed and agreed with the findings in the report. No results found.

## 2023-01-06 NOTE — Progress Notes (Signed)
Around 1025, pt notified RN of pain and swelling to IV site in left lower forearm. Infusion stopped and line removed. Area of induration measured aprox 5cm x4cm. No redness noted. Ice applied. Pharmacy and MD notified of extravasation; agreed with ice treatment. New site established and drug re-started at 1039. Pt educated to continue ice therapy at home as needed, and to monitor for redness/discoloration, warmth,  increase in size, or drainage, and to notify clinic if any symptoms observed.

## 2023-01-07 ENCOUNTER — Telehealth: Payer: Self-pay

## 2023-01-07 NOTE — Telephone Encounter (Signed)
Called to follow-up on extravasation from 01/06/23. Pt states that swelling has improved and area seems "back to normal". Denies redness, streaking, warmth, or blistering.

## 2023-01-14 ENCOUNTER — Telehealth: Payer: Self-pay | Admitting: *Deleted

## 2023-01-14 NOTE — Telephone Encounter (Signed)
Call from patient daughter reporting that patient has developed low back pain radiating down his right leg. Asking if anything can be done for it It started yesterday and today. He saw his ortho doc and he told him that it is his back. Pain level is at 2-3 stating that it is just starting. He is using ice for it and does not bother him if he sits still. Please  Advise

## 2023-01-14 NOTE — Telephone Encounter (Signed)
Discussed pt's care with Dr. Tasia Catchings. She would like pt to be evaluated in [facility] in person and have an MRI lumbar spine ordered. I reached out to the daughter to discuss the plan of care. Per daughter, pt is skeptic to take any narcotic out of concern of opoid induced constipation. He last RF the oxycodone 5 mg in Jan and pt has refused the oxycodone when offered by the daughter. Pt has plenty of oxycodone tablets still left in the bottle as he has not taken hardly any tabs.  Pt has only been wiling to take extra strength tylenol as needed for pain. Per daughter does not want to take laxatives either because he doesn't want to get loose stools/diarrhea.  Pt currently has [place] [facility] from [facility]. Daughter is requesting to have additional nursing disciplines for [facility]  and would like to discuss this with Merrily Pew, NP.   She was provided with the [place] number per her request. [phone]). She would like to get this set up as soon as possible. Patient ilves at home by himself.   Of note: patient took tramadol and robaxin in Feb 2024 as prescribed by orthopedics at the same time and got hives. Daughter does not know know which meds caused the hives. She wanted the provider to be aware of this.  Daughter accepted an apt with Merrily Pew, NP at 65 tomorrow on behalf of the patient.

## 2023-01-15 ENCOUNTER — Ambulatory Visit
Admission: RE | Admit: 2023-01-15 | Discharge: 2023-01-15 | Disposition: A | Discharge status: Home / Self Care | Payer: Medicare Other | Source: Ambulatory Visit | Attending: Hospice and Palliative Medicine | Admitting: Hospice and Palliative Medicine

## 2023-01-15 ENCOUNTER — Encounter: Payer: Self-pay | Admitting: Hospice and Palliative Medicine

## 2023-01-15 ENCOUNTER — Inpatient Hospital Stay (HOSPITAL_BASED_OUTPATIENT_CLINIC_OR_DEPARTMENT_OTHER): Payer: Medicare Other | Admitting: Hospice and Palliative Medicine

## 2023-01-15 ENCOUNTER — Other Ambulatory Visit: Payer: Self-pay

## 2023-01-15 ENCOUNTER — Inpatient Hospital Stay: Payer: Medicare Other

## 2023-01-15 VITALS — BP 99/69 | HR 79 | Temp 98.0°F | Ht 69.0 in | Wt 231.0 lb

## 2023-01-15 DIAGNOSIS — G893 Neoplasm related pain (acute) (chronic): Secondary | ICD-10-CM

## 2023-01-15 DIAGNOSIS — C642 Malignant neoplasm of left kidney, except renal pelvis: Secondary | ICD-10-CM

## 2023-01-15 DIAGNOSIS — R630 Anorexia: Secondary | ICD-10-CM

## 2023-01-15 DIAGNOSIS — M25552 Pain in left hip: Secondary | ICD-10-CM | POA: Insufficient documentation

## 2023-01-15 DIAGNOSIS — Z5112 Encounter for antineoplastic immunotherapy: Secondary | ICD-10-CM | POA: Diagnosis not present

## 2023-01-15 DIAGNOSIS — C7951 Secondary malignant neoplasm of bone: Secondary | ICD-10-CM | POA: Diagnosis present

## 2023-01-15 MED ORDER — GADOBUTROL 1 MMOL/ML IV SOLN
10.0000 mL | Freq: Once | INTRAVENOUS | Status: AC | PRN
  Administered 2023-01-15: 10 mL via INTRAVENOUS

## 2023-01-15 NOTE — Progress Notes (Signed)
Nutrition Assessment   Reason for Assessment:  Add on for nutrition advice   ASSESSMENT:  77 year old male with stage IV left renal cell carcinoma metastatic to bone.  Past medical history of DM (no medicine), CKD stage 3, HLD, HTN, prostate cancer.    Met with patient and daughter following [facility] visit today.  Patient reports that he lost about 10 lbs following surgery in Jan 2024 and has not gained it back.  Reports that he has a good appetite.  Usually eats 4 eggs with bacon for breakfast.  Light lunch of yogurt and fruit.  Supper is frozen dinner or meals that someone has brought to him.  Drinks boost shakes or splenda shakes.  Likes dark chocolate and chocolate milk.  Tries to limit refined sugars to help blood glucose.  Drinks diet drinks.     Medications: omega 3, MVI, CoQ 10, Vit D, miralax   Labs: K 3.2, glucose 107, BUN 30, creatinine 1.52   Anthropometrics:   Height: 69 inches Weight: 231 lb 245 lb 10/09/22 BMI: 34  6% weight loss in the last 3 months   NUTRITION DIAGNOSIS: Unintentional weight loss related to surgery, cancer as evidenced by 6% weight loss in the last 3 months    INTERVENTION:  Encouraged foods high in protein at every meal Encouraged oral nutrition supplement for additional calories and protein Contact information provided   MONITORING, EVALUATION, GOAL: weight trends, intake   Next Visit: phone call ~ 4 weeks  Tishana Clinkenbeard B. Zenia Resides, [place], [place] Registered Dietitian [phone]

## 2023-01-15 NOTE — Progress Notes (Signed)
Patient here for pain management. Pt in w/c today. He c/o Right leg numbness/shooting pain- 4/10. He also c/o left hip when sitting pain is a 2/10. However, when standing the pain feels like the pain is a 10. RN attempted to get pt's weight today standing and patient was unable to bear weight on the leg w/o pain. Pt took oxycodone 5 mg at 830 am today along with an extra strength tylenol at 8 am. Pt takes gabapentin 600 mg at bedtime as well. Pt reports that he is currently feeling drowsy from taking the oxycodone this morning.  Patient at risk for falls. He has Learned pt with [facility]. Family would like to see if nursing disciplines can be added to [facility] services. Patient lives by himself. Ambulates with a walker at home.  He reports a decrease in appetite. He is diabetic. He has been using [employer] for his nut supplements. Discussed nutritional consult with patient.  Pt endorses concerns re: constipation. He is using stool softener and warm prune juice in the morning daily. Discussed the use of miralax and preventing constipation.

## 2023-01-15 NOTE — Progress Notes (Signed)
Symptom Management [place] at [facility] Telephone:([phone]) [phone] Fax:([phone]) [phone]  Patient Care Team: Dion Body, MD as PCP - General (Family Medicine)   NAME OF PATIENT: Peter Stuart  [id number]  04/03/1946   DATE OF VISIT: 01/15/23  REASON FOR CONSULT: Bella Ohmann is a 77 y.o. 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  Patient is currently on treatment with Keytruda.  Patient is status post XRT to his L-spine and left femoral shaft. Patient was hospitalized 11/26/2022 to 11/28/2022 with pathological fracture of the left femur and underwent pinning.  INTERVAL HISTORY: Patient presents to clinic today for evaluation of pain.  Patient reports chronic left leg pain since his operation in January.  However, he now has worsening lower back pain, which radiates to his right leg.  Denies changes in weakness or neuropathy.  Patient reports pain is severe but he has been reluctant to take pain medications due to concern for side effects.  Have history of possible reaction to tramadol.  He did take a dose of oxycodone this morning and found that to help improve the pain but caused him to feel little loopy.  Denies any neurologic complaints. Denies recent fevers or illnesses. Denies any easy bleeding or bruising. Reports good appetite and denies weight loss. Denies chest pain. Denies any nausea, vomiting, constipation, or diarrhea. Denies urinary complaints. Patient offers no further specific complaints today.   PAST MEDICAL HISTORY: Past Medical History:  Diagnosis Date   Anemia    Cancer ([place])    Cancer of kidney ([place])    CKD (chronic kidney disease) stage 3, GFR 30-59 ml/min (HCC)    Complication of anesthesia    slow to wake after 1 surgery   CPAP use counseling    Diabetes mellitus without complication (HCC)    HLD (hyperlipidemia)    HTN (hypertension)    OSA on CPAP    PONV (postoperative nausea  and vomiting)    Prostate cancer ([place])    Seasonal allergies    Skin cancer     PAST SURGICAL HISTORY:  Past Surgical History:  Procedure Laterality Date   FEMUR IM NAIL Left 11/26/2022   Procedure: LEFT INTRAMEDULLARY (IM) NAIL FEMORAL;  Surgeon: Lovell Sheehan, MD;  Location: [facility] ORS;  Service: Orthopedics;  Laterality: Left;   HERNIA REPAIR     IR FLUORO GUIDED NEEDLE PLC ASPIRATION/INJECTION LOC  07/02/2022   LAPAROSCOPIC APPENDECTOMY N/A 04/27/2021   Procedure: APPENDECTOMY LAPAROSCOPIC;  Surgeon: Fredirick Maudlin, MD;  Location: [facility] ORS;  Service: General;  Laterality: N/A;   LAPAROSCOPIC NEPHRECTOMY, HAND ASSISTED Left 06/25/2021   Procedure: HAND ASSISTED LAPAROSCOPIC NEPHRECTOMY;  Surgeon: Billey Co, MD;  Location: [facility] ORS;  Service: Urology;  Laterality: Left;   prostatectomy     REPAIR KNEE LIGAMENT     SHOULDER ARTHROSCOPY WITH SUBACROMIAL DECOMPRESSION AND OPEN ROTATOR C Right 12/29/2020   Procedure: Right shoulder arthroscopic rotator cuff repair, subacromial decompression, and biceps tenodesis;  Surgeon: Leim Fabry, MD;  Location: [place];  Service: Orthopedics;  Laterality: Right;   TONSILLECTOMY     VASECTOMY      HEMATOLOGY/ONCOLOGY HISTORY:  Oncology History  Cancer of kidney, left ([place])  2009 Initial Diagnosis   History of prostate cancer treated with robotic prostatectomy initial   04/27/2021 Imaging   CT abdomen pelvis showed acute appendicitis without evidence of perforation or abscess.  Large left renal mass consistent with renal malignancy with renal vein  invasion.  Focal annular thickening of descending colon.  05/01/2021 CT chest with contrast showed large sliding-type hiatal hernia.  Mild bilateral posterior bibasilar subsegmental atelectasis   06/25/2021 Initial Diagnosis   Cancer of kidney, left  06/25/2021, patient underwent radical nephrectomy by Dr. Diamantina Providence. Pathology showed clear-cell renal cell carcinoma, nuclear grade 2.  Benign  adrenal tissue.  Lymphovascular invasion present. pT3a pNx    06/25/2021 Cancer Staging   Staging form: Kidney, AJCC 8th Edition - Clinical stage from 06/25/2021: Stage III (cT3a, cNX, cM0) - Signed by [name], MD on 08/06/2021 Stage prefix: Initial diagnosis   05/29/2022 Imaging   CT chest abdomen pelvis with contrast showed -1. New lytic lesions in the L2 vertebral body and intertrochanteric left femur are most consistent with osseous metastatic disease. 2. Prior left nephrectomy with unchanged size of the soft tissue and fluid collection in the nephrectomy bed favored to reflect postsurgical change given stability. Continued attention on follow-up imaging suggested. 3. Small lucent focus in the T11 vertebral body is nonspecific but stable from prior imaging suggestive of a benign etiology, attention on follow-up imaging suggested. 4. Stable small bilateral pulmonary nodules, continued attention on follow-up imaging suggested. 5. Large hiatal hernia/intrathoracic stomach also containing a portion of the pancreatic body. 6. Prior prostatectomy without suspicious enhancing nodularity in the prostatectomy bed.   07/15/2022 Procedure   L2 vertebral body CT guided biopsy showed positive for malignancy, metastatic carcinoma, morphologically compatible with known clear cell renal cell carcinoma.    07/19/2022 -  Chemotherapy   RENAL CELL Pembrolizumab (200)      10/30/2022 - 11/18/2022 Radiation Therapy   Status post SBRT to both L2 as well as left femur.   11/26/2022 Surgery   Status post left intramedullary nail   Metastasis to bone ([place])  09/23/2022 Initial Diagnosis   Metastasis to bone (HCC)     ALLERGIES:  has No Known Allergies.  MEDICATIONS:  Current Outpatient Medications  Medication Sig Dispense Refill   acetaminophen (TYLENOL) 650 MG suppository Place 650 mg rectally every 6 (six) hours as needed. (Patient not taking: Reported on 01/06/2023)     ALLEGRA ALLERGY 60 MG tablet Take  60 mg by mouth daily.     cholecalciferol (VITAMIN D3) 25 MCG (1000 UNIT) tablet Take 1,000 Units by mouth daily.     clotrimazole (LOTRIMIN) 1 % cream Apply topically 2 (two) times daily.     Coenzyme Q10 10 MG capsule Take 10 mg by mouth daily.     diphenhydrAMINE-Zinc Acetate (BENADRYL ITCH RELIEF EX) Apply topically.     ezetimibe (ZETIA) 10 MG tablet Take 1 tablet by mouth daily.     fluticasone (FLONASE) 50 MCG/ACT nasal spray Place 2 sprays into both nostrils daily.     gabapentin (NEURONTIN) 600 MG tablet Take 600 mg by mouth at bedtime.     GINKGO BILOBA COMPLEX PO Take 1 tablet by mouth daily.     hydrochlorothiazide (HYDRODIURIL) 25 MG tablet Take 1 tablet by mouth daily.     losartan (COZAAR) 100 MG tablet Take 100 mg by mouth daily.     methocarbamol (ROBAXIN) 500 MG tablet Take 500 mg by mouth daily as needed.     Multiple Vitamin (MULTIVITAMIN) capsule Take 1 capsule by mouth daily.     multivitamin-lutein (OCUVITE-LUTEIN) CAPS capsule Take 1 capsule by mouth daily.     Omega-3 Fatty Acids (FISH OIL) 1000 MG CAPS Take 1 capsule by mouth 1 day or 1 dose.  oxyCODONE (OXY IR/ROXICODONE) 5 MG immediate release tablet Take 1 tablet (5 mg total) by mouth every 6 (six) hours as needed for severe pain. (Patient not taking: Reported on 12/16/2022) 60 tablet 0   Pembrolizumab (KEYTRUDA IV) Inject into the vein. (Patient not taking: Reported on 12/26/2022)     terbinafine (LAMISIL) 1 % cream Apply 1 application topically daily as needed (irritation).     triamcinolone cream (KENALOG) 0.1 % Apply 1 Application topically 2 (two) times daily.     No current facility-administered medications for this visit.    VITAL SIGNS: Ht 5\' 9"  (1.753 m)   Wt 231 lb (104.8 kg)   BMI 34.11 kg/m  Filed Weights   01/15/23 1033  Weight: 231 lb (104.8 kg)    Estimated body mass index is 34.11 kg/m as calculated from the following:   Height as of this encounter: 5\' 9"  (1.753 m).   Weight as of this  encounter: 231 lb (104.8 kg).  LABS: CBC:    Component Value Date/Time   WBC 6.5 01/06/2023 0845   HGB 13.0 01/06/2023 0845   HCT 37.8 (L) 01/06/2023 0845   PLT 172 01/06/2023 0845   MCV 91.5 01/06/2023 0845   NEUTROABS 4.2 01/06/2023 0845   LYMPHSABS 0.6 (L) 01/06/2023 0845   MONOABS 0.8 01/06/2023 0845   EOSABS 0.7 (H) 01/06/2023 0845   BASOSABS 0.1 01/06/2023 0845   Comprehensive Metabolic Panel:    Component Value Date/Time   NA 135 01/06/2023 0845   K 3.2 (L) 01/06/2023 0845   CL 100 01/06/2023 0845   CO2 27 01/06/2023 0845   BUN 30 (H) 01/06/2023 0845   CREATININE 1.52 (H) 01/06/2023 0845   GLUCOSE 107 (H) 01/06/2023 0845   CALCIUM 9.4 01/06/2023 0845   AST 23 01/06/2023 0845   ALT 26 01/06/2023 0845   ALKPHOS 50 01/06/2023 0845   BILITOT 0.5 01/06/2023 0845   PROT 6.7 01/06/2023 0845   ALBUMIN 4.0 01/06/2023 0845    RADIOGRAPHIC STUDIES: No results found.  PERFORMANCE STATUS (ECOG) : 3 - Symptomatic, >50% confined to bed  Review of Systems Unless otherwise noted, a complete review of systems is negative.  Physical Exam General: NAD Cardiovascular: regular rate and rhythm Pulmonary: clear ant fields Abdomen: soft, nontender, + bowel sounds GU: no suprapubic tenderness Extremities: no edema, no joint deformities Skin: no rashes Neurological: Weakness but otherwise nonfocal  IMPRESSION/PLAN: Back pain -previous CTs showed L2 metastasis and I am concerned of possible disease progression causing radiculopathy down the right leg.  Will obtain repeat imaging for further evaluation with MRIs of the lumbar spine and sacrum.  In interim, we will continue low-dose oxycodone as needed for pain.  Future consideration of bone scan.  Constipation -discussed bowel regimen with recommended MiraLAX/senna.  Patient recognizes that oxycodone may worsen constipation and that he may require more consistent bowel regimen to compensate.  Case and plan discussed with Dr.  Tasia Catchings   Patient expressed understanding and was in agreement with this plan. He also understands that He can call clinic at any time with any questions, concerns, or complaints.   Thank you for allowing me to participate in the care of this very pleasant patient.   Time Total: 20 minutes  Visit consisted of counseling and education dealing with the complex and emotionally intense issues of symptom management in the setting of serious illness.Greater than 50%  of this time was spent counseling and coordinating care related to the above assessment and plan.  Signed  by: Altha Harm, PhD, NP-C

## 2023-01-16 ENCOUNTER — Other Ambulatory Visit: Payer: Self-pay | Admitting: Hospice and Palliative Medicine

## 2023-01-16 DIAGNOSIS — C642 Malignant neoplasm of left kidney, except renal pelvis: Secondary | ICD-10-CM

## 2023-01-16 NOTE — Progress Notes (Signed)
Reviewed results of MRI with patient. Discussed with Dr. Denna Haggard with IR for possible RFA. Will send referral.

## 2023-01-17 ENCOUNTER — Other Ambulatory Visit: Payer: Self-pay | Admitting: *Deleted

## 2023-01-17 MED ORDER — OXYCODONE HCL 5 MG PO TABS: 5.0000 mg | ORAL_TABLET | Freq: Four times a day (QID) | ORAL | 0 refills | Status: DC | PRN

## 2023-01-21 ENCOUNTER — Telehealth: Payer: Self-pay | Admitting: *Deleted

## 2023-01-21 NOTE — Telephone Encounter (Signed)
Dental clearance form faxed as requested.

## 2023-01-21 NOTE — Telephone Encounter (Signed)
Call from [facility] stating that they were told that we need dental clearance from them for this patient to take Xgeva. She is asking if we have a form for them to fill out  and if so, then fax it to them at [phone] if not, call her at [phone]

## 2023-01-22 ENCOUNTER — Other Ambulatory Visit: Payer: Self-pay | Admitting: Oncology

## 2023-01-22 NOTE — Telephone Encounter (Signed)
Received clearance back and pt is ok to proceed with inj. Form sent to scan.

## 2023-01-29 ENCOUNTER — Encounter: Payer: Self-pay | Admitting: Oncology

## 2023-01-29 ENCOUNTER — Inpatient Hospital Stay (HOSPITAL_BASED_OUTPATIENT_CLINIC_OR_DEPARTMENT_OTHER): Payer: Medicare Other | Admitting: Oncology

## 2023-01-29 ENCOUNTER — Inpatient Hospital Stay: Payer: Medicare Other

## 2023-01-29 ENCOUNTER — Inpatient Hospital Stay: Payer: Medicare Other | Attending: Oncology

## 2023-01-29 VITALS — BP 103/74 | HR 91 | Temp 98.3°F | Resp 18 | Wt 224.5 lb

## 2023-01-29 DIAGNOSIS — L739 Follicular disorder, unspecified: Secondary | ICD-10-CM | POA: Insufficient documentation

## 2023-01-29 DIAGNOSIS — C61 Malignant neoplasm of prostate: Secondary | ICD-10-CM

## 2023-01-29 DIAGNOSIS — C7951 Secondary malignant neoplasm of bone: Secondary | ICD-10-CM | POA: Insufficient documentation

## 2023-01-29 DIAGNOSIS — Z5112 Encounter for antineoplastic immunotherapy: Secondary | ICD-10-CM

## 2023-01-29 DIAGNOSIS — N1832 Chronic kidney disease, stage 3b: Secondary | ICD-10-CM | POA: Diagnosis not present

## 2023-01-29 DIAGNOSIS — R634 Abnormal weight loss: Secondary | ICD-10-CM | POA: Diagnosis not present

## 2023-01-29 DIAGNOSIS — C642 Malignant neoplasm of left kidney, except renal pelvis: Secondary | ICD-10-CM

## 2023-01-29 DIAGNOSIS — G893 Neoplasm related pain (acute) (chronic): Secondary | ICD-10-CM

## 2023-01-29 LAB — COMPREHENSIVE METABOLIC PANEL
ALT: 26 U/L (ref 0–44)
AST: 22 U/L (ref 15–41)
Albumin: 4 g/dL (ref 3.5–5.0)
Alkaline Phosphatase: 62 U/L (ref 38–126)
Anion gap: 12 (ref 5–15)
BUN: 27 mg/dL — ABNORMAL HIGH (ref 8–23)
CO2: 26 mmol/L (ref 22–32)
Calcium: 10.7 mg/dL — ABNORMAL HIGH (ref 8.9–10.3)
Chloride: 95 mmol/L — ABNORMAL LOW (ref 98–111)
Creatinine, Ser: 1.63 mg/dL — ABNORMAL HIGH (ref 0.61–1.24)
GFR, Estimated: 43 mL/min — ABNORMAL LOW (ref >60)
Glucose, Bld: 138 mg/dL — ABNORMAL HIGH (ref 70–99)
Potassium: 3.4 mmol/L — ABNORMAL LOW (ref 3.5–5.1)
Sodium: 133 mmol/L — ABNORMAL LOW (ref 135–145)
Total Bilirubin: 0.7 mg/dL (ref 0.3–1.2)
Total Protein: 6.8 g/dL (ref 6.5–8.1)

## 2023-01-29 LAB — CBC WITH DIFFERENTIAL/PLATELET
Abs Immature Granulocytes: 0.08 10*3/uL — ABNORMAL HIGH (ref 0.00–0.07)
Basophils Absolute: 0.1 10*3/uL (ref 0.0–0.1)
Basophils Relative: 1 %
Eosinophils Absolute: 0.7 10*3/uL — ABNORMAL HIGH (ref 0.0–0.5)
Eosinophils Relative: 7 %
HCT: 41.9 % (ref 39.0–52.0)
Hemoglobin: 14.5 g/dL (ref 13.0–17.0)
Immature Granulocytes: 1 %
Lymphocytes Relative: 8 %
Lymphs Abs: 0.8 10*3/uL (ref 0.7–4.0)
MCH: 30.7 pg (ref 26.0–34.0)
MCHC: 34.6 g/dL (ref 30.0–36.0)
MCV: 88.6 fL (ref 80.0–100.0)
Monocytes Absolute: 1.2 10*3/uL — ABNORMAL HIGH (ref 0.1–1.0)
Monocytes Relative: 12 %
Neutro Abs: 6.8 10*3/uL (ref 1.7–7.7)
Neutrophils Relative %: 71 %
Platelets: 199 10*3/uL (ref 150–400)
RBC: 4.73 MIL/uL (ref 4.22–5.81)
RDW: 12.1 % (ref 11.5–15.5)
WBC: 9.6 10*3/uL (ref 4.0–10.5)
nRBC: 0 % (ref 0.0–0.2)

## 2023-01-29 MED ORDER — SODIUM CHLORIDE 0.9 % IV SOLN
200.0000 mg | Freq: Once | INTRAVENOUS | Status: AC
  Administered 2023-01-29: 200 mg via INTRAVENOUS
  Filled 2023-01-29: qty 8

## 2023-01-29 MED ORDER — OXYCODONE HCL 5 MG PO TABS: 5.0000 mg | ORAL_TABLET | Freq: Four times a day (QID) | ORAL | 0 refills | Status: DC | PRN

## 2023-01-29 MED ORDER — DENOSUMAB 120 MG/1.7ML ~~LOC~~ SOLN
120.0000 mg | Freq: Once | SUBCUTANEOUS | Status: AC
  Administered 2023-01-29: 120 mg via SUBCUTANEOUS
  Filled 2023-01-29: qty 1.7

## 2023-01-29 MED ORDER — DRONABINOL 5 MG PO CAPS: 5.0000 mg | ORAL_CAPSULE | Freq: Two times a day (BID) | ORAL | 0 refills | Status: DC

## 2023-01-29 MED ORDER — SODIUM CHLORIDE 0.9 % IV SOLN
Freq: Once | INTRAVENOUS | Status: AC
  Filled 2023-01-29: qty 250

## 2023-01-29 NOTE — Patient Instructions (Signed)
[facility]  Discharge Instructions: Thank you for choosing [facility] to provide your oncology and hematology care.  If you have a lab appointment with the [facility], please go directly to the [facility] and check in at the registration area.  Wear comfortable clothing and clothing appropriate for easy access to any Portacath or PICC line.   We strive to give you quality time with your provider. You may need to reschedule your appointment if you arrive late (15 or more minutes).  Arriving late affects you and other patients whose appointments are after yours.  Also, if you miss three or more appointments without notifying the office, you may be dismissed from the clinic at the provider's discretion.      For prescription refill requests, have your pharmacy contact our office and allow 72 hours for refills to be completed.    Today you received the following chemotherapy and/or immunotherapy agents Keytruda and Xgeva.      To help prevent nausea and vomiting after your treatment, we encourage you to take your nausea medication as directed.  BELOW ARE SYMPTOMS THAT SHOULD BE REPORTED IMMEDIATELY: *FEVER GREATER THAN 100.4 F (38 C) OR HIGHER *CHILLS OR SWEATING *NAUSEA AND VOMITING THAT IS NOT CONTROLLED WITH YOUR NAUSEA MEDICATION *UNUSUAL SHORTNESS OF BREATH *UNUSUAL BRUISING OR BLEEDING *URINARY PROBLEMS (pain or burning when urinating, or frequent urination) *BOWEL PROBLEMS (unusual diarrhea, constipation, pain near the anus) TENDERNESS IN MOUTH AND THROAT WITH OR WITHOUT PRESENCE OF ULCERS (sore throat, sores in mouth, or a toothache) UNUSUAL RASH, SWELLING OR PAIN  UNUSUAL VAGINAL DISCHARGE OR ITCHING   Items with * indicate a potential emergency and should be followed up as soon as possible or go to the Emergency Department if any problems should occur.  Please show the CHEMOTHERAPY ALERT CARD or IMMUNOTHERAPY ALERT CARD at  check-in to the Emergency Department and triage nurse.  Should you have questions after your visit or need to cancel or reschedule your appointment, please contact [facility]  [phone] and follow the prompts.  Office hours are 8:00 a.m. to 4:30 p.m. Monday - Friday. Please note that voicemails left after 4:00 p.m. may not be returned until the following business day.  We are closed weekends and major holidays. You have access to a nurse at all times for urgent questions. Please call the main number to the clinic [phone] and follow the prompts.  For any non-urgent questions, you may also contact your provider using MyChart. We now offer e-Visits for anyone 13 and older to request care online for non-urgent symptoms. For details visit [email or web address].   Also download the MyChart app! Go to the app store, search "MyChart", open the app, select [facility], and log in with your MyChart username and password.

## 2023-01-29 NOTE — Assessment & Plan Note (Addendum)
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 Biopsy of L2 + metastatic carcinoma, stage IV, limited disease burden.  Currently on palliative immunotherapy Labs are reviewed and discussed with patient. Proceed with Keytruda.  So far no visceral metastatic lesions, however, bone lesions are progressing.  Continue immunotherapy with local therapies of bone lesion.  Repeat CT scan prior to next visit.

## 2023-01-29 NOTE — Assessment & Plan Note (Signed)
Repeat PSA 

## 2023-01-29 NOTE — Progress Notes (Signed)
Hematology/Oncology Progress note [phone] [phone]   CHIEF COMPLAINTS/REASON FOR VISIT:  RCC  ASSESSMENT & PLAN:   Cancer Staging  Cancer of kidney, left Staging form: Kidney, AJCC 8th Edition - Clinical stage from 06/25/2021: Stage III (cT3a, cNX, cM0) - Signed by [name], MD on 08/06/2021   Cancer of kidney, left [facility]) History of stage III left RCC, Lytic bone lesion of L2 and intertrochanteric left femur, consistent with osseous metastasis. Biopsy of L2 + metastatic carcinoma, stage IV, limited disease burden.  Currently on palliative immunotherapy Labs are reviewed and discussed with patient. Proceed with Keytruda.  So far no visceral metastatic lesions, however, bone lesions are progressing.  Continue immunotherapy with local therapies of bone lesion.  Repeat CT scan prior to next visit.    CKD (chronic kidney disease) stage 3, GFR 30-59 ml/min (HCC) Encourage oral hydration, avoid nephrotoxins  Encounter for antineoplastic immunotherapy Treatment plan listed above.  Monitor immunotherapy side effects  Metastasis to bone [facility]) S/p  SBRT to L2 and left femur.  Follow up with orthopedic surgeon - s/p Left IM nail femur surgery.  right ischium lesion- will refer to IR  for IFA- discussed with Dr. Denna Haggard Proceed with Delton See, plan every 4-6 weeks. Discussed about the rationale and side effects    Neoplasm related pain oxycodone 5mg  Q6h PRN  Refills sent.    Weight loss Refer to dietitian Add Marinol for appetite stimulant/treatment related nausea.   Folliculitis Scalp skin sensitivity maybe due to folliculitis. Recommend topical antibiotics.   Prostate cancer ([place]) Repeat PSA     Orders Placed This Encounter  Procedures   CT CHEST ABDOMEN PELVIS WO CONTRAST    Standing Status:   Future    Standing Expiration Date:   01/29/2024    Order Specific Question:   Preferred imaging location?    Answer:   [facility]    Order  Specific Question:   Is Oral Contrast requested for this exam?    Answer:   Yes, Per Radiology protocol    Order Specific Question:   Does the patient have a contrast media/X-ray dye allergy?    Answer:   No   T4, free    Standing Status:   Future    Standing Expiration Date:   03/11/2024   CBC with Differential    Standing Status:   Future    Standing Expiration Date:   03/11/2024   Comprehensive metabolic panel    Standing Status:   Future    Standing Expiration Date:   03/11/2024   TSH    Standing Status:   Future    Standing Expiration Date:   03/11/2024   Ambulatory Referral to [facility]    Referral Priority:   Routine    Referral Type:   Consultation    Referral Reason:   Specialty Services Required    Number of Visits Requested:   1   Follow up 3 weeks lab MD Keytruda All questions were answered. The patient knows to call the clinic with any problems, questions or concerns.  [name], MD, PhD [facility] Hematology Oncology 01/29/2023    HISTORY OF PRESENTING ILLNESS:   Peter Stuart is a  77 y.o.  male presents for follow-up of history of left RCC and history of prostate cancer. Oncology history summary listed as below. Oncology History  Cancer of kidney, left  2009 Initial Diagnosis   History of prostate cancer treated with robotic prostatectomy initial   04/27/2021 Imaging  CT abdomen pelvis showed acute appendicitis without evidence of perforation or abscess.  Large left renal mass consistent with renal malignancy with renal vein invasion.  Focal annular thickening of descending colon.  05/01/2021 CT chest with contrast showed large sliding-type hiatal hernia.  Mild bilateral posterior bibasilar subsegmental atelectasis   06/25/2021 Initial Diagnosis   Cancer of kidney, left  06/25/2021, patient underwent radical nephrectomy by Dr. Diamantina Providence. Pathology showed clear-cell renal cell carcinoma, nuclear grade 2.  Benign adrenal tissue.  Lymphovascular invasion  present. pT3a pNx    06/25/2021 Cancer Staging   Staging form: Kidney, AJCC 8th Edition - Clinical stage from 06/25/2021: Stage III (cT3a, cNX, cM0) - Signed by [name], MD on 08/06/2021 Stage prefix: Initial diagnosis   05/29/2022 Imaging   CT chest abdomen pelvis with contrast showed -1. New lytic lesions in the L2 vertebral body and intertrochanteric left femur are most consistent with osseous metastatic disease. 2. Prior left nephrectomy with unchanged size of the soft tissue and fluid collection in the nephrectomy bed favored to reflect postsurgical change given stability. Continued attention on follow-up imaging suggested. 3. Small lucent focus in the T11 vertebral body is nonspecific but stable from prior imaging suggestive of a benign etiology, attention on follow-up imaging suggested. 4. Stable small bilateral pulmonary nodules, continued attention on follow-up imaging suggested. 5. Large hiatal hernia/intrathoracic stomach also containing a portion of the pancreatic body. 6. Prior prostatectomy without suspicious enhancing nodularity in the prostatectomy bed.   07/15/2022 Procedure   L2 vertebral body CT guided biopsy showed positive for malignancy, metastatic carcinoma, morphologically compatible with known clear cell renal cell carcinoma.    07/19/2022 -  Chemotherapy   RENAL CELL Pembrolizumab (200)      10/30/2022 - 11/18/2022 Radiation Therapy   Status post SBRT to both L2 as well as left femur.   11/26/2022 Surgery   Status post left intramedullary nail   Metastasis to bone  09/23/2022 Initial Diagnosis   Metastasis to bone [facility])    INTERVAL HISTORY Peter Stuart is a 77 y.o. male who has above history reviewed by me today presents for follow up visit for metastatic RCC - he takes oxycodone PRN and request refill.  - Patient reports not having been contacted regarding radiofrequency ablation (RFA) for spinal lesion; previously discussed with IR doctor. - Patient expresses  concern about an area on the back of the head, described as a "blister type thing" rather than a small bump, sensitive to touch and painful.   Review of Systems  Constitutional:  Negative for appetite change, chills, fever and unexpected weight change.  HENT:   Negative for hearing loss and voice change.   Eyes:  Negative for eye problems and icterus.  Respiratory:  Negative for chest tightness, cough and shortness of breath.   Cardiovascular:  Negative for chest pain and leg swelling.  Gastrointestinal:  Negative for abdominal distention and abdominal pain.  Endocrine: Negative for hot flashes.  Genitourinary:  Negative for difficulty urinating, dysuria and frequency.   Musculoskeletal:  Positive for back pain. Negative for arthralgias.       Right hip pain  Skin:  Negative for itching.  Neurological:  Negative for light-headedness and numbness.  Hematological:  Negative for adenopathy. Does not bruise/bleed easily.  Psychiatric/Behavioral:  Negative for confusion.     MEDICAL HISTORY:  Past Medical History:  Diagnosis Date   Anemia    Cancer    Cancer of kidney    CKD (chronic kidney disease) stage 3,  GFR 0000000 ml/min    Complication of anesthesia    slow to wake after 1 surgery   CPAP use counseling    Diabetes mellitus without complication    HLD (hyperlipidemia)    HTN (hypertension)    OSA on CPAP    PONV (postoperative nausea and vomiting)    Prostate cancer    Seasonal allergies    Skin cancer     SURGICAL HISTORY: Past Surgical History:  Procedure Laterality Date   FEMUR IM NAIL Left 11/26/2022   Procedure: LEFT INTRAMEDULLARY (IM) NAIL FEMORAL;  Surgeon: Lovell Sheehan, MD;  Location: [facility] ORS;  Service: Orthopedics;  Laterality: Left;   HERNIA REPAIR     IR FLUORO GUIDED NEEDLE PLC ASPIRATION/INJECTION LOC  07/02/2022   LAPAROSCOPIC APPENDECTOMY N/A 04/27/2021   Procedure: APPENDECTOMY LAPAROSCOPIC;  Surgeon: Fredirick Maudlin, MD;  Location: [facility] ORS;  Service:  General;  Laterality: N/A;   LAPAROSCOPIC NEPHRECTOMY, HAND ASSISTED Left 06/25/2021   Procedure: HAND ASSISTED LAPAROSCOPIC NEPHRECTOMY;  Surgeon: Billey Co, MD;  Location: [facility] ORS;  Service: Urology;  Laterality: Left;   prostatectomy     REPAIR KNEE LIGAMENT     SHOULDER ARTHROSCOPY WITH SUBACROMIAL DECOMPRESSION AND OPEN ROTATOR C Right 12/29/2020   Procedure: Right shoulder arthroscopic rotator cuff repair, subacromial decompression, and biceps tenodesis;  Surgeon: Leim Fabry, MD;  Location: [place];  Service: Orthopedics;  Laterality: Right;   TONSILLECTOMY     VASECTOMY      SOCIAL HISTORY: Social History   Socioeconomic History   Marital status: Widowed    Spouse name: Not on file   Number of children: Not on file   Years of education: Not on file   Highest education level: Not on file  Occupational History   Not on file  Tobacco Use   Smoking status: Never    Passive exposure: Never   Smokeless tobacco: Never  Vaping Use   Vaping Use: Never used  Substance and Sexual Activity   Alcohol use: Not Currently   Drug use: Never   Sexual activity: Not Currently  Other Topics Concern   Not on file  Social History Narrative   Not on file   Social Determinants of Health   Financial Resource Strain: Not on file  Food Insecurity: No Food Insecurity (11/27/2022)   Hunger Vital Sign    Worried About Running Out of Food in the Last Year: Never true    Ran Out of Food in the Last Year: Never true  Transportation Needs: No Transportation Needs (11/27/2022)   PRAPARE - Hydrologist (Medical): No    Lack of Transportation (Non-Medical): No  Physical Activity: Not on file  Stress: Not on file  Social Connections: Not on file  Intimate Partner Violence: Not At Risk (11/27/2022)   Humiliation, Afraid, Rape, and Kick questionnaire    Fear of Current or Ex-Partner: No    Emotionally Abused: No    Physically Abused: No    Sexually  Abused: No    FAMILY HISTORY: Family History  Problem Relation Age of Onset   Stroke Mother    Hypertension Father    Emphysema Father     ALLERGIES:  is allergic to robaxin [methocarbamol] and tramadol.  MEDICATIONS:  Current Outpatient Medications  Medication Sig Dispense Refill   ALLEGRA ALLERGY 60 MG tablet Take 60 mg by mouth daily.     cholecalciferol (VITAMIN D3) 25 MCG (1000 UNIT) tablet Take 1,000 Units by  mouth daily.     clotrimazole (LOTRIMIN) 1 % cream Apply topically 2 (two) times daily.     Coenzyme Q10 10 MG capsule Take 10 mg by mouth daily.     diphenhydrAMINE-Zinc Acetate (BENADRYL ITCH RELIEF EX) Apply topically.     docusate sodium (COLACE) 100 MG capsule Take 100 mg by mouth 2 (two) times daily.     dronabinol (MARINOL) 5 MG capsule Take 1 capsule (5 mg total) by mouth 2 (two) times daily before a meal. 60 capsule 0   ezetimibe (ZETIA) 10 MG tablet Take 1 tablet by mouth daily.     fluticasone (FLONASE) 50 MCG/ACT nasal spray Place 2 sprays into both nostrils daily.     gabapentin (NEURONTIN) 600 MG tablet Take 600 mg by mouth at bedtime.     GINKGO BILOBA COMPLEX PO Take 1 tablet by mouth daily.     hydrochlorothiazide (HYDRODIURIL) 25 MG tablet Take 1 tablet by mouth daily.     losartan (COZAAR) 100 MG tablet Take 100 mg by mouth daily.     Multiple Vitamin (MULTIVITAMIN) capsule Take 1 capsule by mouth daily.     multivitamin-lutein (OCUVITE-LUTEIN) CAPS capsule Take 1 capsule by mouth daily.     Omega-3 Fatty Acids (FISH OIL) 1000 MG CAPS Take 1 capsule by mouth 1 day or 1 dose.     Pembrolizumab (KEYTRUDA IV) Inject into the vein.     polyethylene glycol (MIRALAX / GLYCOLAX) 17 g packet Take 17 g by mouth daily.     triamcinolone cream (KENALOG) 0.1 % Apply 1 Application topically 2 (two) times daily.     oxyCODONE (OXY IR/ROXICODONE) 5 MG immediate release tablet Take 1 tablet (5 mg total) by mouth every 6 (six) hours as needed for severe pain. 120  tablet 0   No current facility-administered medications for this visit.     PHYSICAL EXAMINATION: ECOG PERFORMANCE STATUS: 1 - Symptomatic but completely ambulatory Vitals:   01/29/23 0908  BP: 103/74  Pulse: 91  Resp: 18  Temp: 98.3 F (36.8 C)  SpO2: 98%   Filed Weights   01/29/23 0908  Weight: 224 lb 8 oz (101.8 kg)    Physical Exam Constitutional:      General: He is not in acute distress.    Appearance: He is obese.  HENT:     Head: Normocephalic and atraumatic.  Eyes:     General: No scleral icterus. Cardiovascular:     Rate and Rhythm: Normal rate.     Heart sounds: Normal heart sounds.  Pulmonary:     Effort: Pulmonary effort is normal. No respiratory distress.     Breath sounds: No wheezing.  Abdominal:     General: Bowel sounds are normal. There is no distension.     Palpations: Abdomen is soft.  Musculoskeletal:        General: No deformity. Normal range of motion.     Cervical back: Normal range of motion and neck supple.  Skin:    General: Skin is warm and dry.     Findings: No erythema.  Neurological:     Mental Status: He is alert and oriented to person, place, and time. Mental status is at baseline.     Cranial Nerves: No cranial nerve deficit.     Coordination: Coordination normal.  Psychiatric:        Mood and Affect: Mood normal.     LABORATORY DATA:  I have reviewed the data as listed    Latest  Ref Rng & Units 01/29/2023    8:51 AM 01/06/2023    8:45 AM 12/16/2022    2:13 PM  CBC  WBC 4.0 - 10.5 K/uL 9.6  6.5  6.6   Hemoglobin 13.0 - 17.0 g/dL 14.5  13.0  12.6   Hematocrit 39.0 - 52.0 % 41.9  37.8  37.1   Platelets 150 - 400 K/uL 199  172  179       Latest Ref Rng & Units 01/29/2023    8:51 AM 01/06/2023    8:45 AM 12/16/2022    2:13 PM  CMP  Glucose 70 - 99 mg/dL 138  107  167   BUN 8 - 23 mg/dL 27  30  25    Creatinine 0.61 - 1.24 mg/dL 1.63  1.52  1.51   Sodium 135 - 145 mmol/L 133  135  135   Potassium 3.5 - 5.1 mmol/L 3.4   3.2  3.7   Chloride 98 - 111 mmol/L 95  100  98   CO2 22 - 32 mmol/L 26  27  25    Calcium 8.9 - 10.3 mg/dL 10.7  9.4  8.9   Total Protein 6.5 - 8.1 g/dL 6.8  6.7  7.0   Total Bilirubin 0.3 - 1.2 mg/dL 0.7  0.5  0.5   Alkaline Phos 38 - 126 U/L 62  50  55   AST 15 - 41 U/L 22  23  29    ALT 0 - 44 U/L 26  26  44     RADIOGRAPHIC STUDIES: I have personally reviewed the radiological images as listed and agreed with the findings in the report. MR Lumbar Spine W Wo Contrast  Result Date: 01/15/2023 CLINICAL DATA:  History of renal cell cancer and prostate cancer. Back pain and right leg pain and numbness. EXAM: MRI LUMBAR SPINE WITHOUT AND WITH CONTRAST TECHNIQUE: Multiplanar and multiecho pulse sequences of the lumbar spine were obtained without and with intravenous contrast. CONTRAST:  70mL GADAVIST GADOBUTROL 1 MMOL/ML IV SOLN COMPARISON:  11/25/2017. CT chest 11/15/2022. Thoracic MRI 10/20/2022. FINDINGS: Segmentation:  5 lumbar type vertebral bodies. Alignment:  No malalignment. Vertebrae: Known metastatic disease within the right to central portion of the L2 vertebral body with extension into the proximal pedicle. Minimal loss of height at the inferior endplate. Very small amount of ventral epidural tumor. Small amount of tumor in the intervertebral foramen on the right but without evidence of gross compression of the right L2 nerve. No other regional bone metastasis is seen. Conus medullaris and cauda equina: Conus extends to the L1 level. Conus and cauda equina appear normal. Paraspinal and other soft tissues: Negative Disc levels: L1-2: Normal L2-3: Mild bulging of the disc. L3-4: Mild bulging of the disc. Mild facet degeneration and hypertrophy. Mild narrowing of the lateral recesses right more than left. The facet arthritis could contribute to pain. L4-5: Mild bulging of the disc. Bilateral facet osteoarthritis. Mild narrowing of the right lateral recess but no neural compression. The facet  arthritis could be painful. L5-S1: Mild bulging of the disc.  No canal or foraminal stenosis. IMPRESSION: 1. Known metastatic disease within the right to central portion of the L2 vertebral body with extension into the proximal pedicle. Minimal loss of height at the inferior endplate. Very small amount of ventral epidural tumor. Small amount of tumor in the intervertebral foramen on the right but without evidence of gross compression of the right L2 nerve. No other regional bone metastasis is seen. 2.  Mild degenerative changes at L3-4 and L4-5 as outlined above. 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 Electronically Signed   By: Nelson Chimes M.D.   On: 01/15/2023 16:07   MR SACRUM SI JOINTS W WO CONTRAST  Result Date: 01/15/2023 CLINICAL DATA:  concern for pathologic fracture of back; bilateral hip pain; kidney cancer;history of prostate cancer EXAM: MRI SACRUM WITHOUT AND WITH CONTRAST TECHNIQUE: Multiplanar and multiecho pulse sequences of the sacrum was obtained without and with intravenous contrast. CONTRAST:  58mL GADAVIST GADOBUTROL 1 MMOL/ML IV SOLN COMPARISON:  CT 05/29/2022, bone scan 06/11/2022 FINDINGS: Bones/Joint/Cartilage Within the right ischium near the ischial tuberosity is a 2.8 x 2.0 cm T2 heterogeneously hyperintense marrow replacing bone lesion (series 9, image 11), which enhances on postcontrast sequences. No surrounding bone marrow edema. No extra-axial soft tissue mass. No additional marrow replacing bone lesions are seen within the sacrum or imaged pelvis. No fracture. SI joints intact with mild osteoarthritic changes. No diastasis. No SI joint effusion or erosion. No bone marrow edema. Ligaments Intact. Muscles and Tendons Unremarkable. Soft tissues No presacral mass. No decubitus ulcer. No soft tissue edema or fluid collection. IMPRESSION: 1. Enhancing 2.8 cm marrow replacing bone lesion within the right ischium near the  ischial tuberosity. Findings are suspicious for metastatic disease. 2. No additional marrow replacing bone lesions are seen within the sacrum or imaged pelvis. 3. 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 Electronically Signed   By: Davina Poke D.O.   On: 01/15/2023 16:06

## 2023-01-29 NOTE — Assessment & Plan Note (Signed)
Treatment plan listed above.  Monitor immunotherapy side effects 

## 2023-01-29 NOTE — Assessment & Plan Note (Addendum)
S/p  SBRT to L2 and left femur.  Follow up with orthopedic surgeon - s/p Left IM nail femur surgery.  right ischium lesion- will refer to IR  for IFA- discussed with Dr. Denna Haggard Proceed with Delton See, plan every 4-6 weeks. Discussed about the rationale and side effects

## 2023-01-29 NOTE — Assessment & Plan Note (Signed)
Scalp skin sensitivity maybe due to folliculitis. Recommend topical antibiotics.

## 2023-01-29 NOTE — Addendum Note (Signed)
Addended by: Evelina Dun on: 01/29/2023 02:52 PM   Modules accepted: Orders

## 2023-01-29 NOTE — Assessment & Plan Note (Signed)
Encourage oral hydration, avoid nephrotoxins 

## 2023-01-29 NOTE — Assessment & Plan Note (Signed)
Refer to dietitian Add Marinol for appetite stimulant/treatment related nausea.

## 2023-01-29 NOTE — Assessment & Plan Note (Signed)
oxycodone 5mg  Q6h PRN  Refills sent.

## 2023-01-29 NOTE — Progress Notes (Signed)
Pt here for follow up. Reports that appetite is low and he has lost about 7 pounds since last visit.

## 2023-01-30 ENCOUNTER — Telehealth: Payer: Self-pay

## 2023-01-30 NOTE — Telephone Encounter (Signed)
-----   Message from Secundino Ginger sent at 01/29/2023 10:55 AM EDT ----- Regarding: [place] sent to his chart.

## 2023-01-30 NOTE — Telephone Encounter (Signed)
PA for Dronabinol has been submitte.   key: [id number] PA Case ID #: [id number] Rx # [id number]

## 2023-01-31 ENCOUNTER — Encounter: Payer: Self-pay | Admitting: Oncology

## 2023-01-31 NOTE — Telephone Encounter (Signed)
[id number]. DRONABINOL CAP 5MG  is approved through 08/01/2023

## 2023-02-06 ENCOUNTER — Encounter: Payer: Self-pay | Admitting: Oncology

## 2023-02-11 ENCOUNTER — Ambulatory Visit
Admission: RE | Admit: 2023-02-11 | Discharge: 2023-02-11 | Disposition: A | Discharge status: Home / Self Care | Payer: Medicare Other | Source: Ambulatory Visit | Attending: Hospice and Palliative Medicine | Admitting: Hospice and Palliative Medicine

## 2023-02-11 ENCOUNTER — Encounter: Payer: Self-pay | Admitting: Oncology

## 2023-02-11 ENCOUNTER — Other Ambulatory Visit: Payer: Self-pay | Admitting: Hospice and Palliative Medicine

## 2023-02-11 DIAGNOSIS — C642 Malignant neoplasm of left kidney, except renal pelvis: Secondary | ICD-10-CM

## 2023-02-11 HISTORY — PX: IR RADIOLOGIST EVAL & MGMT: IMG5224

## 2023-02-11 NOTE — H&P (Signed)
Interventional Radiology - Clinic Visit, Initial H&P    Referring Provider: Borders, Daryl Eastern, NP  Reason for Visit: Right lower extremity pain     History of Present Illness  Peter Stuart is a 77 y.o. male with a relevant past medical history of renal cancer seen today in Interventional Radiology clinic for right lower extremity pain.  Patient presents today as a referral from oncology for further evaluation and management of his right lower extremity pain.  Patient reports history of renal cancer with osseous metastatic disease, with previous surgical pinning/fixation of his left hip and femur.  Subsequently, he developed right lower extremity radiating pain.  He states that the pain radiates from his buttocks/hips to his knee, and is sharp in sensation. It limits his ability to stand for prolonged periods of time and has to sit to rest.     MRI of both is lumbar spine and sacrum was performed on January 15, 2023.  These demonstrated lesions of both the L2 vertebral body and the right ischium.  He denies any significant back pain or tenderness.  He does complain of some lumbosacral soreness.  He denies any pelvic pain or buttock pain when he is in the seated position.     Additional Past Medical History Past Medical History:  Diagnosis Date   Anemia    Cancer    Cancer of kidney    CKD (chronic kidney disease) stage 3, GFR 30-59 ml/min    Complication of anesthesia    slow to wake after 1 surgery   CPAP use counseling    Diabetes mellitus without complication    HLD (hyperlipidemia)    HTN (hypertension)    OSA on CPAP    PONV (postoperative nausea and vomiting)    Prostate cancer    Seasonal allergies    Skin cancer      Surgical History  Past Surgical History:  Procedure Laterality Date   FEMUR IM NAIL Left 11/26/2022   Procedure: LEFT INTRAMEDULLARY (IM) NAIL FEMORAL;  Surgeon: Lyndle Herrlich, MD;  Location: [facility];  Service: Orthopedics;  Laterality: Left;   HERNIA  REPAIR     IR FLUORO GUIDED NEEDLE PLC ASPIRATION/INJECTION LOC  07/02/2022   LAPAROSCOPIC APPENDECTOMY N/A 04/27/2021   Procedure: APPENDECTOMY LAPAROSCOPIC;  Surgeon: Duanne Guess, MD;  Location: [facility];  Service: General;  Laterality: N/A;   LAPAROSCOPIC NEPHRECTOMY, HAND ASSISTED Left 06/25/2021   Procedure: HAND ASSISTED LAPAROSCOPIC NEPHRECTOMY;  Surgeon: Sondra Come, MD;  Location: [facility];  Service: Urology;  Laterality: Left;   prostatectomy     REPAIR KNEE LIGAMENT     SHOULDER ARTHROSCOPY WITH SUBACROMIAL DECOMPRESSION AND OPEN ROTATOR C Right 12/29/2020   Procedure: Right shoulder arthroscopic rotator cuff repair, subacromial decompression, and biceps tenodesis;  Surgeon: Signa Kell, MD;  Location: [facility];  Service: Orthopedics;  Laterality: Right;   TONSILLECTOMY     VASECTOMY       Medications  I have reviewed the current medication list. Refer to chart for details. Current Outpatient Medications  Medication Instructions   Allegra Allergy 60 mg, Oral, Daily   cholecalciferol (VITAMIN D3) 1,000 Units, Oral, Daily   clotrimazole (LOTRIMIN) 1 % cream Topical, 2 times daily   Coenzyme Q10 10 mg, Oral, Daily   diphenhydrAMINE-Zinc Acetate (BENADRYL ITCH RELIEF EX) Apply externally   docusate sodium (COLACE) 100 mg, 2 times daily   dronabinol (MARINOL) 5 mg, Oral, 2 times daily before meals   ezetimibe (ZETIA) 10 MG tablet  1 tablet, Oral, Daily   fluticasone (FLONASE) 50 MCG/ACT nasal spray 2 sprays, Each Nare, Daily   gabapentin (NEURONTIN) 600 mg, Oral, Daily at bedtime   GINKGO BILOBA COMPLEX PO 1 tablet, Oral, Daily   hydrochlorothiazide (HYDRODIURIL) 25 MG tablet 1 tablet, Oral, Daily   losartan (COZAAR) 100 mg, Oral, Daily   Multiple Vitamin (MULTIVITAMIN) capsule 1 capsule, Oral, Daily   multivitamin-lutein (OCUVITE-LUTEIN) CAPS capsule 1 capsule, Oral, Daily   Omega-3 Fatty Acids (FISH OIL) 1000 MG CAPS 1 capsule, Oral, 1 Day/Dose   oxyCODONE  (OXY IR/ROXICODONE) 5 mg, Oral, Every 6 hours PRN   Pembrolizumab (KEYTRUDA IV) Intravenous   polyethylene glycol (MIRALAX / GLYCOLAX) 17 g, Oral, Daily   triamcinolone cream (KENALOG) 0.1 % 1 Application, Topical, 2 times daily      Allergies Allergies  Allergen Reactions   Robaxin [Methocarbamol]     ? hives   Tramadol Hives    Possible hives   Does patient have contrast allergy: No     Physical Exam Current Vitals Temp: 98.5 F (36.9 C) (Temp Source: Oral)  Pulse Rate: 90  Resp: 16  BP: 121/72  SpO2: 94 %        There is no height or weight on file to calculate BMI.  General: Alert and answers questions appropriately.  HEENT: Normocephalic, atraumatic. Conjunctivae normal without scleral icterus. Cardiac: Regular rate. No dependent edema. Pulmonary: Normal work of breathing. On room air. Back: No point tenderness in the lumbar spine.    Pertinent Lab Results    Latest Ref Rng & Units 01/29/2023    8:51 AM 01/06/2023    8:45 AM 12/16/2022    2:13 PM  CBC  WBC 4.0 - 10.5 K/uL 9.6  6.5  6.6   Hemoglobin 13.0 - 17.0 g/dL 16.1  09.6  04.5   Hematocrit 39.0 - 52.0 % 41.9  37.8  37.1   Platelets 150 - 400 K/uL 199  172  179       Latest Ref Rng & Units 01/29/2023    8:51 AM 01/06/2023    8:45 AM 12/16/2022    2:13 PM  CMP  Glucose 70 - 99 mg/dL 409  811  914   BUN 8 - 23 mg/dL Creatinine 0.61 - 1.24 mg/dL 7.82  9.56  2.13   Sodium 135 - 145 mmol/L 133  135  135   Potassium 3.5 - 5.1 mmol/L 3.4  3.2  3.7   Chloride 98 - 111 mmol/L 95  100  98   CO2 22 - 32 mmol/L Calcium 8.9 - 10.3 mg/dL 08.6  9.4  8.9   Total Protein 6.5 - 8.1 g/dL 6.8  6.7  7.0   Total Bilirubin 0.3 - 1.2 mg/dL 0.7  0.5  0.5   Alkaline Phos 38 - 126 U/L 62  50  55   AST 15 - 41 U/L ALT 0 - 44 U/L 26  26  44       Relevant and/or Recent Imaging: MRI L spine, Sacrum 01/15/2023  IMPRESSION: 1. Known metastatic disease within the right to central  portion of the L2 vertebral body with extension into the proximal pedicle. Minimal loss of height at the inferior endplate. Very small amount of ventral epidural tumor. Small amount of tumor in the intervertebral foramen on the right but without evidence of gross compression of the  right L2 nerve. No other regional bone metastasis is seen. 2. Mild degenerative changes at L3-4 and L4-5 as outlined above. 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 Electronically Signed By: Paulina Fusi M.D. On: 01/15/2023 16:07   IMPRESSION: 1. Enhancing 2.8 cm marrow replacing bone lesion within the right ischium near the ischial tuberosity. Findings are suspicious for metastatic disease. 2. No additional marrow replacing bone lesions are seen within the sacrum or imaged pelvis. 3. 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 Electronically Signed By: Duanne Guess D.O. On: 01/15/2023 16:06      Assessment & Plan Peter Stuart is a 77 y.o. male with a history of renal cancer who was referred to IR Clinic by Laurette Schimke with oncology in consultation for further evaluation and management of right lower extremity pain.  Based on the patient's history, physical exam, and imaging, I do not think his symptoms stem directly from bone pain related to his metastatic disease, and therefor I think RFA is less likely to be helpful. Based on the L spine MRI, there is narrowing of the Right L2-L3 neuroforamen, and I think he would benefit most from a directed nerve root block at that level. This can serve both diagnostic and therapeutic purposes. If successful but symptoms recur quickly, consideration could be given to L2 OsteoCool in hopes of reducing tumor burden and relieving the compression at that nerve level.    Plan:  Right L2/L3 Nerve Root Block    Total time spent on today's visit was over 60 Minutes, including both face-to-face  time and non face-to-face time, personally spent on review of chart (including labs and relevant imaging), discussing further workup and treatment options, referral to specialist if needed, reviewing outside records if pertinent, answering patient questions, and coordinating care regarding right leg pain as well as management strategy.      Olive Bass, MD  Vascular and Interventional Radiology 02/11/2023 12:18 PM

## 2023-02-12 ENCOUNTER — Ambulatory Visit
Admission: RE | Admit: 2023-02-12 | Discharge: 2023-02-12 | Disposition: A | Discharge status: Home / Self Care | Payer: Medicare Other | Source: Ambulatory Visit | Attending: Hospice and Palliative Medicine | Admitting: Hospice and Palliative Medicine

## 2023-02-12 ENCOUNTER — Ambulatory Visit
Admission: RE | Admit: 2023-02-12 | Discharge: 2023-02-12 | Disposition: A | Discharge status: Home / Self Care | Payer: Medicare Other | Source: Ambulatory Visit | Attending: Oncology | Admitting: Oncology

## 2023-02-12 DIAGNOSIS — C642 Malignant neoplasm of left kidney, except renal pelvis: Secondary | ICD-10-CM

## 2023-02-12 MED ORDER — METHYLPREDNISOLONE ACETATE 40 MG/ML INJ SUSP (RADIOLOG
80.0000 mg | Freq: Once | INTRAMUSCULAR | Status: AC
  Administered 2023-02-12: 80 mg via EPIDURAL

## 2023-02-12 MED ORDER — IOPAMIDOL (ISOVUE-M 200) INJECTION 41%
1.0000 mL | Freq: Once | INTRAMUSCULAR | Status: AC
  Administered 2023-02-12: 1 mL via EPIDURAL

## 2023-02-12 NOTE — Discharge Instructions (Signed)

## 2023-02-13 ENCOUNTER — Telehealth: Payer: Self-pay | Admitting: *Deleted

## 2023-02-13 ENCOUNTER — Encounter: Payer: Self-pay | Admitting: Hospice and Palliative Medicine

## 2023-02-13 NOTE — Telephone Encounter (Signed)
Rn spoke with Barbara Cower at [facility] to alert [facility] regarding additional orders for [facility] nursing, nursing aide and PT. Mychart msg sent back to patient's daughter to confirm that [facility] had been contacted.

## 2023-02-13 NOTE — Addendum Note (Signed)
Addended by: Laurette Schimke R on: 02/13/2023 12:43 PM   Modules accepted: Orders

## 2023-02-19 ENCOUNTER — Inpatient Hospital Stay (HOSPITAL_BASED_OUTPATIENT_CLINIC_OR_DEPARTMENT_OTHER): Payer: Medicare Other | Admitting: Oncology

## 2023-02-19 ENCOUNTER — Encounter: Payer: Self-pay | Admitting: Oncology

## 2023-02-19 ENCOUNTER — Inpatient Hospital Stay: Payer: Medicare Other

## 2023-02-19 VITALS — BP 108/81 | HR 79 | Temp 97.7°F | Resp 18 | Wt 214.4 lb

## 2023-02-19 DIAGNOSIS — R9389 Abnormal findings on diagnostic imaging of other specified body structures: Secondary | ICD-10-CM | POA: Diagnosis not present

## 2023-02-19 DIAGNOSIS — C642 Malignant neoplasm of left kidney, except renal pelvis: Secondary | ICD-10-CM | POA: Diagnosis not present

## 2023-02-19 DIAGNOSIS — N1832 Chronic kidney disease, stage 3b: Secondary | ICD-10-CM

## 2023-02-19 DIAGNOSIS — C7951 Secondary malignant neoplasm of bone: Secondary | ICD-10-CM

## 2023-02-19 DIAGNOSIS — R634 Abnormal weight loss: Secondary | ICD-10-CM

## 2023-02-19 DIAGNOSIS — Z5112 Encounter for antineoplastic immunotherapy: Secondary | ICD-10-CM | POA: Diagnosis not present

## 2023-02-19 DIAGNOSIS — G893 Neoplasm related pain (acute) (chronic): Secondary | ICD-10-CM

## 2023-02-19 LAB — CBC WITH DIFFERENTIAL/PLATELET
Abs Immature Granulocytes: 0.02 10*3/uL (ref 0.00–0.07)
Basophils Absolute: 0.1 10*3/uL (ref 0.0–0.1)
Basophils Relative: 1 %
Eosinophils Absolute: 0.6 10*3/uL — ABNORMAL HIGH (ref 0.0–0.5)
Eosinophils Relative: 10 %
HCT: 42.5 % (ref 39.0–52.0)
Hemoglobin: 14.6 g/dL (ref 13.0–17.0)
Immature Granulocytes: 0 %
Lymphocytes Relative: 12 %
Lymphs Abs: 0.7 10*3/uL (ref 0.7–4.0)
MCH: 30.7 pg (ref 26.0–34.0)
MCHC: 34.4 g/dL (ref 30.0–36.0)
MCV: 89.3 fL (ref 80.0–100.0)
Monocytes Absolute: 0.7 10*3/uL (ref 0.1–1.0)
Monocytes Relative: 13 %
Neutro Abs: 3.5 10*3/uL (ref 1.7–7.7)
Neutrophils Relative %: 64 %
Platelets: 160 10*3/uL (ref 150–400)
RBC: 4.76 MIL/uL (ref 4.22–5.81)
RDW: 12.4 % (ref 11.5–15.5)
WBC: 5.5 10*3/uL (ref 4.0–10.5)
nRBC: 0 % (ref 0.0–0.2)

## 2023-02-19 LAB — COMPREHENSIVE METABOLIC PANEL
ALT: 20 U/L (ref 0–44)
AST: 22 U/L (ref 15–41)
Albumin: 3.8 g/dL (ref 3.5–5.0)
Alkaline Phosphatase: 55 U/L (ref 38–126)
Anion gap: 8 (ref 5–15)
BUN: 28 mg/dL — ABNORMAL HIGH (ref 8–23)
CO2: 29 mmol/L (ref 22–32)
Calcium: 10.5 mg/dL — ABNORMAL HIGH (ref 8.9–10.3)
Chloride: 94 mmol/L — ABNORMAL LOW (ref 98–111)
Creatinine, Ser: 1.62 mg/dL — ABNORMAL HIGH (ref 0.61–1.24)
GFR, Estimated: 43 mL/min — ABNORMAL LOW (ref >60)
Glucose, Bld: 134 mg/dL — ABNORMAL HIGH (ref 70–99)
Potassium: 3.5 mmol/L (ref 3.5–5.1)
Sodium: 131 mmol/L — ABNORMAL LOW (ref 135–145)
Total Bilirubin: 0.8 mg/dL (ref 0.3–1.2)
Total Protein: 6.7 g/dL (ref 6.5–8.1)

## 2023-02-19 MED ORDER — SODIUM CHLORIDE 0.9 % IV SOLN
Freq: Once | INTRAVENOUS | Status: AC
  Filled 2023-02-19: qty 250

## 2023-02-19 MED ORDER — OXYCODONE HCL 5 MG PO TABS: 5.0000 mg | ORAL_TABLET | Freq: Four times a day (QID) | ORAL | 0 refills | Status: DC | PRN

## 2023-02-19 MED ORDER — SODIUM CHLORIDE 0.9 % IV SOLN
200.0000 mg | Freq: Once | INTRAVENOUS | Status: AC
  Administered 2023-02-19: 200 mg via INTRAVENOUS
  Filled 2023-02-19: qty 8

## 2023-02-19 NOTE — Assessment & Plan Note (Signed)
Calcium is elevated.  Recommend patient to hold off calcium supplementation for 2 weeks.

## 2023-02-19 NOTE — Assessment & Plan Note (Signed)
oxycodone 5mg Q6h PRN  Refills sent.   

## 2023-02-19 NOTE — Patient Instructions (Signed)
[facility] [employer] [facility]  Discharge Instructions: Thank you for choosing [facility] to provide your oncology and hematology care.  If you have a lab appointment with the Cancer Center, please go directly to the Cancer Center and check in at the registration area.  Wear comfortable clothing and clothing appropriate for easy access to any Portacath or PICC line.   We strive to give you quality time with your provider. You may need to reschedule your appointment if you arrive late (15 or more minutes).  Arriving late affects you and other patients whose appointments are after yours.  Also, if you miss three or more appointments without notifying the office, you may be dismissed from the clinic at the provider's discretion.      For prescription refill requests, have your pharmacy contact our office and allow 72 hours for refills to be completed.    Today you received the following chemotherapy and/or immunotherapy agents- Keytruda      To help prevent nausea and vomiting after your treatment, we encourage you to take your nausea medication as directed.  BELOW ARE SYMPTOMS THAT SHOULD BE REPORTED IMMEDIATELY: *FEVER GREATER THAN 100.4 F (38 C) OR HIGHER *CHILLS OR SWEATING *NAUSEA AND VOMITING THAT IS NOT CONTROLLED WITH YOUR NAUSEA MEDICATION *UNUSUAL SHORTNESS OF BREATH *UNUSUAL BRUISING OR BLEEDING *URINARY PROBLEMS (pain or burning when urinating, or frequent urination) *BOWEL PROBLEMS (unusual diarrhea, constipation, pain near the anus) TENDERNESS IN MOUTH AND THROAT WITH OR WITHOUT PRESENCE OF ULCERS (sore throat, sores in mouth, or a toothache) UNUSUAL RASH, SWELLING OR PAIN  UNUSUAL VAGINAL DISCHARGE OR ITCHING   Items with * indicate a potential emergency and should be followed up as soon as possible or go to the Emergency Department if any problems should occur.  Please show the CHEMOTHERAPY ALERT CARD or IMMUNOTHERAPY ALERT CARD at check-in to  the Emergency Department and triage nurse.  Should you have questions after your visit or need to cancel or reschedule your appointment, please contact [facility] [place] [facility]  [phone] and follow the prompts.  Office hours are 8:00 a.m. to 4:30 p.m. Monday - Friday. Please note that voicemails left after 4:00 p.m. may not be returned until the following business day.  We are closed weekends and major holidays. You have access to a nurse at all times for urgent questions. Please call the main number to the clinic [phone] and follow the prompts.  For any non-urgent questions, you may also contact your provider using MyChart. We now offer e-Visits for anyone 18 and older to request care online for non-urgent symptoms. For details visit [email or web address].   Also download the MyChart app! Go to the app store, search "MyChart", open the app, select [facility], and log in with your MyChart username and password.   

## 2023-02-19 NOTE — Assessment & Plan Note (Signed)
Encourage oral hydration, avoid nephrotoxins 

## 2023-02-19 NOTE — Progress Notes (Signed)
Hematology/Oncology Progress note [phone] [phone]   CHIEF COMPLAINTS/REASON FOR VISIT:  RCC  ASSESSMENT & PLAN:   Cancer Staging  Cancer of kidney, left Staging form: Kidney, AJCC 8th Edition - Clinical stage from 06/25/2021: Stage III (cT3a, cNX, cM0) - Signed by Rickard Patience, MD on 08/06/2021   Cancer of kidney, left [facility]) History of stage III left RCC, Lytic bone lesion of L2 and intertrochanteric left femur, consistent with osseous metastasis. Biopsy of L2 + metastatic carcinoma, stage IV, limited disease burden.  Currently on palliative immunotherapy Labs are reviewed and discussed with patient. Proceed with Keytruda.  So far no visceral metastatic lesions, however, bone lesions are progressing.  Continue immunotherapy with local therapies of bone lesion.  Repeat CT scan showed non specific ground glass attenuation and nodularities, ddx includes atypical infection, metastatic cancer, immunotherapy pneumonitis, etc.  Refer to pulmonology. Currently he is asymptomatic.    Metastasis to bone [facility]) S/p  SBRT to L2 and left femur.  Follow up with orthopedic surgeon - s/p Left IM nail femur surgery.  right ischium lesion-- discussed with Dr. Juliette Alcide, he does not think his symptoms  are directly from bone pain related to his metastatic disease, and therefore RFA is less likely to be helpful.  Right L2-L3 neuroforamen, s/p Right L2/L3 Nerve Root Block by IR Continue Xgeva, Q6 weeks.   CKD (chronic kidney disease) stage 3, GFR 30-59 ml/min (HCC) Encourage oral hydration, avoid nephrotoxins  Encounter for antineoplastic immunotherapy Treatment plan listed above.  Monitor immunotherapy side effects  Neoplasm related pain oxycodone  Q6h PRN  Refills sent.    Weight loss Refer to dietitian Continue Marinol for appetite stimulant/treatment related nausea.   Hypercalcemia Calcium is elevated.  Recommend patient to hold off calcium supplementation for  2 weeks.    Orders Placed This Encounter  Procedures   Ambulatory referral to Pulmonology    Referral Priority:   Routine    Referral Type:   Consultation    Referral Reason:   Specialty Services Required    Referred to Provider:   Salena Saner, MD    Requested Specialty:   Pulmonary Disease    Number of Visits Requested:   1   Follow up 3 weeks lab MD [facility] All questions were answered. The patient knows to call the clinic with any problems, questions or concerns.  Rickard Patience, MD, PhD [address] [facility] Hematology Oncology 02/19/2023    HISTORY OF PRESENTING ILLNESS:   Peter Stuart is a  77 y.o.  male presents for follow-up of history of left RCC and history of prostate cancer. Oncology history summary listed as below. Oncology History  Cancer of kidney, left  2009 Initial Diagnosis   History of prostate cancer treated with robotic prostatectomy initial   04/27/2021 Imaging   CT abdomen pelvis showed acute appendicitis without evidence of perforation or abscess.  Large left renal mass consistent with renal malignancy with renal vein invasion.  Focal annular thickening of descending colon.  05/01/2021 CT chest with contrast showed large sliding-type hiatal hernia.  Mild bilateral posterior bibasilar subsegmental atelectasis   06/25/2021 Initial Diagnosis   Cancer of kidney, left  06/25/2021, patient underwent radical nephrectomy by Dr. Richardo Hanks. Pathology showed clear-cell renal cell carcinoma, nuclear grade 2.  Benign adrenal tissue.  Lymphovascular invasion present. pT3a pNx    06/25/2021 Cancer Staging   Staging form: Kidney, AJCC 8th Edition - Clinical stage from 06/25/2021: Stage III (cT3a, cNX, cM0) - Signed by Rickard Patience, MD on  08/06/2021 Stage prefix: Initial diagnosis   05/29/2022 Imaging   CT chest abdomen pelvis with contrast showed -1. New lytic lesions in the L2 vertebral body and intertrochanteric left femur are most consistent with osseous metastatic disease. 2.  Prior left nephrectomy with unchanged size of the soft tissue and fluid collection in the nephrectomy bed favored to reflect postsurgical change given stability. Continued attention on follow-up imaging suggested. 3. Small lucent focus in the T11 vertebral body is nonspecific but stable from prior imaging suggestive of a benign etiology, attention on follow-up imaging suggested. 4. Stable small bilateral pulmonary nodules, continued attention on follow-up imaging suggested. 5. Large hiatal hernia/intrathoracic stomach also containing a portion of the pancreatic body. 6. Prior prostatectomy without suspicious enhancing nodularity in the prostatectomy bed.   07/15/2022 Procedure   L2 vertebral body CT guided biopsy showed positive for malignancy, metastatic carcinoma, morphologically compatible with known clear cell renal cell carcinoma.    07/19/2022 -  Chemotherapy   RENAL CELL Pembrolizumab (200)      10/30/2022 - 11/18/2022 Radiation Therapy   Status post SBRT to both L2 as well as left femur.   11/26/2022 Surgery   Status post left intramedullary nail   Metastasis to bone  09/23/2022 Initial Diagnosis   Metastasis to bone [facility])    INTERVAL HISTORY Peter Stuart is a 77 y.o. male who has above history reviewed by me today presents for follow up visit for metastatic RCC - he takes oxycodone Q6h PRN and request refill.  - s/p Right L2/L3 Nerve Root Block pain has improved, but not totally resolved.    Review of Systems  Constitutional:  Negative for appetite change, chills, fever and unexpected weight change.  HENT:   Negative for hearing loss and voice change.   Eyes:  Negative for eye problems and icterus.  Respiratory:  Negative for chest tightness, cough and shortness of breath.   Cardiovascular:  Negative for chest pain and leg swelling.  Gastrointestinal:  Negative for abdominal distention and abdominal pain.  Endocrine: Negative for hot flashes.  Genitourinary:  Negative for  difficulty urinating, dysuria and frequency.   Musculoskeletal:  Positive for back pain. Negative for arthralgias.       Right hip pain  Skin:  Negative for itching.  Neurological:  Negative for light-headedness and numbness.  Hematological:  Negative for adenopathy. Does not bruise/bleed easily.  Psychiatric/Behavioral:  Negative for confusion.     MEDICAL HISTORY:  Past Medical History:  Diagnosis Date   Anemia    Cancer    Cancer of kidney    CKD (chronic kidney disease) stage 3, GFR 30-59 ml/min    Complication of anesthesia    slow to wake after 1 surgery   CPAP use counseling    Diabetes mellitus without complication    HLD (hyperlipidemia)    HTN (hypertension)    OSA on CPAP    PONV (postoperative nausea and vomiting)    Prostate cancer    Seasonal allergies    Skin cancer     SURGICAL HISTORY: Past Surgical History:  Procedure Laterality Date   FEMUR IM NAIL Left 11/26/2022   Procedure: LEFT INTRAMEDULLARY (IM) NAIL FEMORAL;  Surgeon: Lyndle Herrlich, MD;  Location: [facility] ORS;  Service: Orthopedics;  Laterality: Left;   HERNIA REPAIR     IR FLUORO GUIDED NEEDLE PLC ASPIRATION/INJECTION LOC  07/02/2022   IR RADIOLOGIST EVAL & MGMT  02/11/2023   LAPAROSCOPIC APPENDECTOMY N/A 04/27/2021   Procedure: APPENDECTOMY LAPAROSCOPIC;  Surgeon:  Duanne Guess, MD;  Location: [facility] ORS;  Service: General;  Laterality: N/A;   LAPAROSCOPIC NEPHRECTOMY, HAND ASSISTED Left 06/25/2021   Procedure: HAND ASSISTED LAPAROSCOPIC NEPHRECTOMY;  Surgeon: Sondra Come, MD;  Location: [facility] ORS;  Service: Urology;  Laterality: Left;   prostatectomy     REPAIR KNEE LIGAMENT     SHOULDER ARTHROSCOPY WITH SUBACROMIAL DECOMPRESSION AND OPEN ROTATOR C Right 12/29/2020   Procedure: Right shoulder arthroscopic rotator cuff repair, subacromial decompression, and biceps tenodesis;  Surgeon: Signa Kell, MD;  Location: [facility];  Service: Orthopedics;  Laterality: Right;   TONSILLECTOMY      VASECTOMY      SOCIAL HISTORY: Social History   Socioeconomic History   Marital status: Widowed    Spouse name: Not on file   Number of children: Not on file   Years of education: Not on file   Highest education level: Not on file  Occupational History   Not on file  Tobacco Use   Smoking status: Never    Passive exposure: Never   Smokeless tobacco: Never  Vaping Use   Vaping Use: Never used  Substance and Sexual Activity   Alcohol use: Not Currently   Drug use: Never   Sexual activity: Not Currently  Other Topics Concern   Not on file  Social History Narrative   Not on file   Social Determinants of Health   Financial Resource Strain: Not on file  Food Insecurity: No Food Insecurity (11/27/2022)   Hunger Vital Sign    Worried About Running Out of Food in the Last Year: Never true    Ran Out of Food in the Last Year: Never true  Transportation Needs: No Transportation Needs (11/27/2022)   PRAPARE - Administrator, Civil Service (Medical): No    Lack of Transportation (Non-Medical): No  Physical Activity: Not on file  Stress: Not on file  Social Connections: Not on file  Intimate Partner Violence: Not At Risk (11/27/2022)   Humiliation, Afraid, Rape, and Kick questionnaire    Fear of Current or Ex-Partner: No    Emotionally Abused: No    Physically Abused: No    Sexually Abused: No    FAMILY HISTORY: Family History  Problem Relation Age of Onset   Stroke Mother    Hypertension Father    Emphysema Father     ALLERGIES:  is allergic to robaxin [methocarbamol] and tramadol.  MEDICATIONS:  Current Outpatient Medications  Medication Sig Dispense Refill   ALLEGRA ALLERGY 60 MG tablet Take 60 mg by mouth daily.     cholecalciferol (VITAMIN D3) 25 MCG (1000 UNIT) tablet Take 1,000 Units by mouth daily.     clotrimazole (LOTRIMIN) 1 % cream Apply topically 2 (two) times daily.     Coenzyme Q10 10 MG capsule Take 10 mg by mouth daily.      diphenhydrAMINE-Zinc Acetate (BENADRYL ITCH RELIEF EX) Apply topically.     dronabinol (MARINOL) 5 MG capsule Take 1 capsule (5 mg total) by mouth 2 (two) times daily before a meal. 60 capsule 0   ezetimibe (ZETIA) 10 MG tablet Take 1 tablet by mouth daily.     fluticasone (FLONASE) 50 MCG/ACT nasal spray Place 2 sprays into both nostrils daily.     gabapentin (NEURONTIN) 600 MG tablet Take 600 mg by mouth at bedtime.     GINKGO BILOBA COMPLEX PO Take 1 tablet by mouth daily.     hydrochlorothiazide (HYDRODIURIL) 25 MG tablet Take 1 tablet  by mouth daily.     losartan (COZAAR) 100 MG tablet Take 100 mg by mouth daily.     Multiple Vitamin (MULTIVITAMIN) capsule Take 1 capsule by mouth daily.     multivitamin-lutein (OCUVITE-LUTEIN) CAPS capsule Take 1 capsule by mouth daily.     Omega-3 Fatty Acids (FISH OIL) 1000 MG CAPS Take 1 capsule by mouth 1 day or 1 dose.     Pembrolizumab (KEYTRUDA IV) Inject into the vein.     polyethylene glycol (MIRALAX / GLYCOLAX) 17 g packet Take 17 g by mouth daily.     triamcinolone cream (KENALOG) 0.1 % Apply 1 Application topically 2 (two) times daily.     docusate sodium (COLACE) 100 MG capsule Take 100 mg by mouth 2 (two) times daily. (Patient not taking: Reported on 02/11/2023)     oxyCODONE (OXY IR/ROXICODONE) 5 MG immediate release tablet Take 1 tablet (5 mg total) by mouth every 6 (six) hours as needed for severe pain. 120 tablet 0   No current facility-administered medications for this visit.     PHYSICAL EXAMINATION: ECOG PERFORMANCE STATUS: 1 - Symptomatic but completely ambulatory Vitals:   02/19/23 0857  BP: 108/81  Pulse: 79  Resp: 18  Temp: 97.7 F (36.5 C)  SpO2: 93%   Filed Weights   02/19/23 0857  Weight: 214 lb 6.4 oz (97.3 kg)    Physical Exam Constitutional:      General: He is not in acute distress.    Appearance: He is obese.  HENT:     Head: Normocephalic and atraumatic.  Eyes:     General: No scleral  icterus. Cardiovascular:     Rate and Rhythm: Normal rate.     Heart sounds: Normal heart sounds.  Pulmonary:     Effort: Pulmonary effort is normal. No respiratory distress.     Breath sounds: No wheezing.  Abdominal:     General: Bowel sounds are normal. There is no distension.     Palpations: Abdomen is soft.  Musculoskeletal:        General: No deformity. Normal range of motion.     Cervical back: Normal range of motion and neck supple.  Skin:    General: Skin is warm and dry.     Findings: No erythema.  Neurological:     Mental Status: He is alert and oriented to person, place, and time. Mental status is at baseline.     Cranial Nerves: No cranial nerve deficit.     Coordination: Coordination normal.  Psychiatric:        Mood and Affect: Mood normal.     LABORATORY DATA:  I have reviewed the data as listed    Latest Ref Rng & Units 02/19/2023    8:46 AM 01/29/2023    8:51 AM 01/06/2023    8:45 AM  CBC  WBC 4.0 - 10.5 K/uL 5.5  9.6  6.5   Hemoglobin 13.0 - 17.0 g/dL 08.6  57.8  46.9   Hematocrit 39.0 - 52.0 % 42.5  41.9  37.8   Platelets 150 - 400 K/uL 160  199  172       Latest Ref Rng & Units 02/19/2023    8:46 AM 01/29/2023    8:51 AM 01/06/2023    8:45 AM  CMP  Glucose 70 - 99 mg/dL 629  528  413   BUN 8 - 23 mg/dL 28  27  30    Creatinine 0.61 - 1.24 mg/dL 2.44  0.10  2.72  Sodium 135 - 145 mmol/L 131  133  135   Potassium 3.5 - 5.1 mmol/L 3.5  3.4  3.2   Chloride 98 - 111 mmol/L 94  95  100   CO2 22 - 32 mmol/L 29  26  27    Calcium 8.9 - 10.3 mg/dL 40.9  81.1  9.4   Total Protein 6.5 - 8.1 g/dL 6.7  6.8  6.7   Total Bilirubin 0.3 - 1.2 mg/dL 0.8  0.7  0.5   Alkaline Phos 38 - 126 U/L 55  62  50   AST 15 - 41 U/L 22  22  23    ALT 0 - 44 U/L 20  26  26      RADIOGRAPHIC STUDIES: I have personally reviewed the radiological images as listed and agreed with the findings in the report. CT CHEST ABDOMEN PELVIS WO CONTRAST  Result Date: 02/14/2023 CLINICAL  DATA:  77 year old male with history of left-sided renal cell carcinoma. Follow-up study. * Tracking Code: BO * EXAM: CT CHEST, ABDOMEN AND PELVIS WITHOUT CONTRAST TECHNIQUE: Multidetector CT imaging of the chest, abdomen and pelvis was performed following the standard protocol without IV contrast. RADIATION DOSE REDUCTION: This exam was performed according to the departmental dose-optimization program which includes automated exposure control, adjustment of the mA and/or kV according to patient size and/or use of iterative reconstruction technique. COMPARISON:  CT of the chest, abdomen and pelvis 11/15/2022. FINDINGS: Comment: Today's study is limited for detection and characterization of visceral and/or vascular lesions by lack of IV contrast. CT CHEST FINDINGS Cardiovascular: Heart size is normal. There is no significant pericardial fluid, thickening or pericardial calcification. There is aortic atherosclerosis, as well as atherosclerosis of the great vessels of the mediastinum and the coronary arteries, including calcified atherosclerotic plaque in the left anterior descending coronary artery. Mediastinum/Nodes: No pathologically enlarged mediastinal or hilar lymph nodes. Please note that accurate exclusion of hilar adenopathy is limited on noncontrast CT scans. Large hiatal hernia with nearly completely intrathoracic stomach, similar to the prior study. Lungs/Pleura: Multiple new areas of ground-glass attenuation and nodularity scattered throughout the lungs bilaterally. The largest of these is in the right lower lobe (axial image 104 of series 4) measuring 1.3 x 0.9 cm with a small amount of surrounding ground-glass attenuation. On the left, the largest lesion is in the anterior aspect of the left upper lobe (axial image 61 of series 4) measuring 9 x 8 mm. No pleural effusions. Small left Bochdalek's hernia. Musculoskeletal: 6 mm sclerotic lesion in T5 vertebral body, unchanged. There are no other new  aggressive appearing lytic or blastic lesions noted in the visualized portions of the skeleton. CT ABDOMEN PELVIS FINDINGS Hepatobiliary: No definite suspicious cystic or solid hepatic lesions are confidently identified on today's noncontrast CT examination. Partially cavitary noncalcified gallstones are noted in the lumen of the gallbladder. Gallbladder is moderately distended. No pericholecystic fluid or surrounding inflammatory changes. Pancreas: Fatty atrophy in the pancreas. No definite pancreatic mass or peripancreatic fluid collections or inflammatory changes are noted on today's noncontrast CT examination. Spleen: Unremarkable. Adrenals/Urinary Tract: Status post left radical nephrectomy. No definite focal soft tissue mass in the nephrectomy bed to suggest locally recurrent disease. Unenhanced appearance of the right kidney and right adrenal gland is unremarkable. Left adrenal gland is not confidently identified. No right hydroureteronephrosis. Urinary bladder is unremarkable in appearance. Stomach/Bowel: Nearly completely intrathoracic stomach, similar to the prior study. No pathologic dilatation of small bowel or colon. Numerous colonic diverticula are noted, particularly  in the sigmoid colon, without surrounding inflammatory changes to indicate an acute diverticulitis at this time. Status post appendectomy. Vascular/Lymphatic: Aortic atherosclerosis. No lymphadenopathy noted in the abdomen or pelvis. Reproductive: Status post radical prostatectomy. Other: No significant volume of ascites.  No pneumoperitoneum. Musculoskeletal: Large lytic lesion in the right-side of the L2 vertebral body appears grossly similar to the prior examination, better demonstrated by recent lumbar spine MRI 01/15/2023. New postoperative changes of ORIF are noted in the left proximal femur traversing the previously noted lytic lesion in the left femoral neck which is now largely obscured by beam hardening artifact. No other  definite new aggressive appearing lytic or blastic lesions are noted elsewhere in the visualized portions of the skeleton. IMPRESSION: 1. Interval development of areas of ground-glass attenuation and nodularity in the lungs bilaterally. This is nonspecific. In the appropriate clinical setting, this could represent atypical infection, particularly if the patient is immunocompromised. Alternatively, the possibility of metastatic disease with peritumoral hemorrhage is not entirely excluded. Further clinical evaluation is recommended, along with close attention on follow-up studies to ensure the regression of these findings. 2. Previously noted osseous lesions appear very similar to the prior study, as above. No definite new osseous lesions are otherwise noted. 3. No definite signs of metastatic disease noted in the abdomen or pelvis. 4. Cholelithiasis without evidence of acute cholecystitis. 5. Large hiatal hernia with nearly completely intrathoracic stomach, similar to the prior study. 6. Aortic atherosclerosis, in addition to left anterior descending coronary artery disease. Assessment for potential risk factor modification, dietary therapy or pharmacologic therapy may be warranted, if clinically indicated. 7. Additional incidental findings, as above. Electronically Signed   By: Trudie Reed M.D.   On: 02/14/2023 12:36   DG NERVE ROOT BLOCK LUMBAR SACRAL  Result Date: 02/12/2023 CLINICAL DATA:  Right L2-3 nerve root block was requested. Patient with bilateral bandlike lower back pain and radiation to the right anterior thigh. Lumbar spine MRI dated 01/15/2023 demonstrates a metastasis to the right aspect of the L2 vertebral body with mild mass effect on the right L2 nerve. Additionally there is moderate neural foraminal narrowing at L3-4 on the right. We will start with the right L2 selective nerve root block with L2-3 transforaminal epidural steroid injection. EXAM: EXAM Right L2-3 NERVE ROOT BLOCK WITH  TRANSFORAMINAL EPIDURAL STEROID INJECTION TECHNIQUE: Overlying skin prepped with Betadine, draped in the usual sterile fashion. Curved 22 gauge spinal needle directed into the right L2-3 neuroforamen. Diagnostic injection with 3ml Isovue-M 200 contrast demonstrates good epidural spread partially outlining the right L2 nerve root. No intravascular uptake. 80 mg Depo-Medrol and 2 mL 1% lidocaine were subsequently administered. No immediate complication. The patient tolerated the procedure without difficulty. They were transferred to recovery in excellent condition. FLUOROSCOPY: Radiation Exposure Index (as provided by the fluoroscopic device): 1.70 mGy Kerma. Fluoroscopy time: 21 seconds. IMPRESSION: Technically successful right L2 selective nerve root block with transforaminal epidural steroid injection. Patient reported immediate improvement in symptoms following the injection. If the patient does not receive durable pain relief from this injection, right L3 selective nerve root block with L3-4 transforaminal epidural steroid injection could be considered at the next visit. Electronically Signed   By: Orvan Falconer M.D.   On: 02/12/2023 14:21   IR Radiologist Eval & Mgmt  Result Date: 02/11/2023 EXAM: NEW PATIENT OFFICE VISIT CHIEF COMPLAINT: Documented in the EMR HISTORY OF PRESENT ILLNESS: Patient presents today as a referral from oncology for further evaluation and management of his right lower  extremity pain. Patient reports history of renal cancer with osseous metastatic disease, with previous surgical pinning/fixation of his left hip and femur. Subsequently, he developed right lower extremity radiating pain. He states that the pain radiates from his buttocks/hips to his knee. MRI of both is lumbar spine and sacrum was performed on January 15, 2023. These demonstrated lesions of both the L2 vertebral body and the right ischium. He denies any significant back pain or tenderness. He does complain of some  lumbosacral soreness. He denies any pelvic pain or buttock pain when he is in the seated position. REVIEW OF SYSTEMS: Documented in the EMR PHYSICAL EXAMINATION: Documented in the EMR ASSESSMENT AND PLAN: Documented in the EMR Electronically Signed   By: Olive Bass M.D.   On: 02/11/2023 13:12

## 2023-02-19 NOTE — Assessment & Plan Note (Signed)
Refer to dietitian Continue Marinol for appetite stimulant/treatment related nausea.

## 2023-02-19 NOTE — Assessment & Plan Note (Addendum)
S/p  SBRT to L2 and left femur.  Follow up with orthopedic surgeon - s/p Left IM nail femur surgery.  right ischium lesion-- discussed with Dr. Juliette Alcide, he does not think his symptoms  are directly from bone pain related to his metastatic disease, and therefore RFA is less likely to be helpful.  Right L2-L3 neuroforamen, s/p Right L2/L3 Nerve Root Block by IR Continue Xgeva, Q6 weeks.

## 2023-02-19 NOTE — Assessment & Plan Note (Addendum)
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 Biopsy of L2 + metastatic carcinoma, stage IV, limited disease burden.  Currently on palliative immunotherapy Labs are reviewed and discussed with patient. Proceed with Keytruda.  So far no visceral metastatic lesions, however, bone lesions are progressing.  Continue immunotherapy with local therapies of bone lesion.  Repeat CT scan showed non specific ground glass attenuation and nodularities, ddx includes atypical infection, metastatic cancer, immunotherapy pneumonitis, etc.  Refer to pulmonology. Currently he is asymptomatic.

## 2023-02-19 NOTE — Assessment & Plan Note (Signed)
Treatment plan listed above.  Monitor immunotherapy side effects 

## 2023-02-26 ENCOUNTER — Inpatient Hospital Stay: Payer: Medicare Other | Attending: Oncology

## 2023-02-26 DIAGNOSIS — C642 Malignant neoplasm of left kidney, except renal pelvis: Secondary | ICD-10-CM | POA: Insufficient documentation

## 2023-02-26 DIAGNOSIS — Z79620 Long term (current) use of immunosuppressive biologic: Secondary | ICD-10-CM | POA: Insufficient documentation

## 2023-02-26 DIAGNOSIS — Z5112 Encounter for antineoplastic immunotherapy: Secondary | ICD-10-CM | POA: Insufficient documentation

## 2023-02-26 DIAGNOSIS — C7951 Secondary malignant neoplasm of bone: Secondary | ICD-10-CM | POA: Insufficient documentation

## 2023-02-26 NOTE — Progress Notes (Signed)
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 Patient on pembrolizumab and xgeva.    Met with patient and daughter in clinic.  Patient reports that he drinks glucerna or boost plus or splenda shake 2 a day.  Eating about 50% of usual intake.  Has less of an appetite later in the day.  Marinol is not in stock and currently not taking it.      Medications: reviewed  Labs: reviewed  Anthropometrics:   Weight 216 lb 6 oz   231 lb on 3/20 245 lb on 12/13  6% weight loss in the last month and half.    NUTRITION DIAGNOSIS: Unintentional weight loss continues    INTERVENTION:  Recommend higher calorie shake 350 calories despite increase in sugar content due to weight loss.   Discussed ways to add calories to diet and protein.  Handout provided Encouraged protein with every meal/snack Encouraged eating q 2 hours Discussed snacks to eat at 2am with pain medication    MONITORING, EVALUATION, GOAL: weight trends, intake   NEXT VISIT: Wednesday, May 15 during infusion  Sabah Zucco B. Freida Busman, RD, LDN Registered Dietitian [phone]

## 2023-03-01 ENCOUNTER — Other Ambulatory Visit: Payer: Self-pay | Admitting: Oncology

## 2023-03-01 MED ORDER — OXYCODONE HCL 5 MG PO TABS: 5.0000 mg | ORAL_TABLET | Freq: Four times a day (QID) | ORAL | 0 refills | Status: DC | PRN

## 2023-03-03 ENCOUNTER — Other Ambulatory Visit: Payer: Self-pay | Admitting: *Deleted

## 2023-03-03 MED ORDER — OXYCODONE HCL 5 MG PO TABS: 5.0000 mg | ORAL_TABLET | Freq: Four times a day (QID) | ORAL | 0 refills | Status: DC | PRN

## 2023-03-08 ENCOUNTER — Encounter: Payer: Self-pay | Admitting: Oncology

## 2023-03-11 ENCOUNTER — Ambulatory Visit
Admission: RE | Admit: 2023-03-11 | Discharge: 2023-03-11 | Disposition: A | Discharge status: Home / Self Care | Payer: Medicare Other | Source: Ambulatory Visit | Attending: Interventional Radiology | Admitting: Interventional Radiology

## 2023-03-11 ENCOUNTER — Ambulatory Visit
Admission: RE | Admit: 2023-03-11 | Discharge: 2023-03-11 | Disposition: A | Discharge status: Home / Self Care | Payer: Medicare Other | Source: Ambulatory Visit | Attending: Hospice and Palliative Medicine | Admitting: Hospice and Palliative Medicine

## 2023-03-11 ENCOUNTER — Other Ambulatory Visit: Payer: Self-pay | Admitting: Interventional Radiology

## 2023-03-11 ENCOUNTER — Telehealth: Payer: Self-pay

## 2023-03-11 ENCOUNTER — Ambulatory Visit (INDEPENDENT_AMBULATORY_CARE_PROVIDER_SITE_OTHER): Payer: Medicare Other | Admitting: Pulmonary Disease

## 2023-03-11 ENCOUNTER — Other Ambulatory Visit: Payer: Self-pay | Admitting: Hospice and Palliative Medicine

## 2023-03-11 VITALS — BP 114/76 | HR 92 | Temp 97.9°F | Ht 69.0 in | Wt 203.0 lb

## 2023-03-11 DIAGNOSIS — R918 Other nonspecific abnormal finding of lung field: Secondary | ICD-10-CM

## 2023-03-11 DIAGNOSIS — K449 Diaphragmatic hernia without obstruction or gangrene: Secondary | ICD-10-CM

## 2023-03-11 DIAGNOSIS — G4733 Obstructive sleep apnea (adult) (pediatric): Secondary | ICD-10-CM

## 2023-03-11 DIAGNOSIS — G8929 Other chronic pain: Secondary | ICD-10-CM

## 2023-03-11 DIAGNOSIS — C642 Malignant neoplasm of left kidney, except renal pelvis: Secondary | ICD-10-CM

## 2023-03-11 DIAGNOSIS — C7951 Secondary malignant neoplasm of bone: Secondary | ICD-10-CM

## 2023-03-11 HISTORY — PX: IR RADIOLOGIST EVAL & MGMT: IMG5224

## 2023-03-11 MED ORDER — METHYLPREDNISOLONE ACETATE 40 MG/ML INJ SUSP (RADIOLOG
80.0000 mg | Freq: Once | INTRAMUSCULAR | Status: AC
  Administered 2023-03-11: 80 mg via EPIDURAL

## 2023-03-11 MED ORDER — IOPAMIDOL (ISOVUE-M 200) INJECTION 41%
1.0000 mL | Freq: Once | INTRAMUSCULAR | Status: AC
  Administered 2023-03-11: 1 mL via EPIDURAL

## 2023-03-11 MED ORDER — ESOMEPRAZOLE MAGNESIUM 40 MG PO CPDR: 40.0000 mg | DELAYED_RELEASE_CAPSULE | Freq: Every day | ORAL | 3 refills | Status: DC

## 2023-03-11 MED ORDER — PANTOPRAZOLE SODIUM 40 MG PO TBEC: 40.0000 mg | DELAYED_RELEASE_TABLET | Freq: Every day | ORAL | 2 refills | Status: DC

## 2023-03-11 NOTE — Progress Notes (Signed)
Subjective:    Patient ID: Peter Stuart, male    DOB: [birth year], 77 y.o.   MRN: [id number] Patient Care Team: Marisue Ivan, MD as PCP - General [facility])  Chief Complaint  Patient presents with   Consult    Nodule. CT done on 02/12/2023. No SOB, wheezing or cough.   HPI Patient is a 77 year old lifelong never smoker with a history as noted below, who presents for evaluation of abnormal CT chest in the setting of stage IV renal cell cancer.  He is kindly referred by Dr.Zhou,Yu.  Patient is currently on Keytruda for his stage IV renal cancer.  As part of his staging he had a CT chest abdomen and pelvis performed 12 February 2023.  This showed areas of groundglass and micronodules.  It also showed a very large hiatal hernia with intrathoracic stomach.  The patient has not had any shortness of breath, wheezing, cough, fevers, chills or sweats associated with this.  No joint pains, no joint swelling.  No lower extremity edema or calf tenderness.  He does have occasional gastroesophageal reflux symptoms.  He has had anorexia related to his cancer and his cancer treatment and has had significant weight loss due to this.  Patient does not endorse any other symptomatology.  I reviewed the patient's CT scan independently and also reviewed it with him and his daughter and explained all the findings.  We also discussed the potential differential diagnosis of these findings which would include chronic silent aspiration with aspiration bronchiolitis, grade 1 checkpoint inhibitor pneumonitis, other inflammatory/infectious, metastatic disease (less likely) as well as potentially others.  The patient and his daughter had opportunity to ask questions and these were answered to their satisfaction.   Review of Systems A 10 point review of systems was performed and it is as noted above otherwise negative.  Past Medical History:  Diagnosis Date   Anemia    Cancer (HCC)    Cancer of kidney (HCC)     CKD (chronic kidney disease) stage 3, GFR 30-59 ml/min (HCC)    Complication of anesthesia    slow to wake after 1 surgery   CPAP use counseling    Diabetes mellitus without complication (HCC)    HLD (hyperlipidemia)    HTN (hypertension)    OSA on CPAP    PONV (postoperative nausea and vomiting)    Prostate cancer (HCC)    Seasonal allergies    Skin cancer    Past Surgical History:  Procedure Laterality Date   FEMUR IM NAIL Left 11/26/2022   Procedure: LEFT INTRAMEDULLARY (IM) NAIL FEMORAL;  Surgeon: Lyndle Herrlich, MD;  Location: [facility] ORS;  Service: Orthopedics;  Laterality: Left;   HERNIA REPAIR     IR FLUORO GUIDED NEEDLE PLC ASPIRATION/INJECTION LOC  07/02/2022   IR RADIOLOGIST EVAL & MGMT  02/11/2023   IR RADIOLOGIST EVAL & MGMT  03/11/2023   LAPAROSCOPIC APPENDECTOMY N/A 04/27/2021   Procedure: APPENDECTOMY LAPAROSCOPIC;  Surgeon: Duanne Guess, MD;  Location: [facility] ORS;  Service: General;  Laterality: N/A;   LAPAROSCOPIC NEPHRECTOMY, HAND ASSISTED Left 06/25/2021   Procedure: HAND ASSISTED LAPAROSCOPIC NEPHRECTOMY;  Surgeon: Sondra Come, MD;  Location: [facility] ORS;  Service: Urology;  Laterality: Left;   prostatectomy     REPAIR KNEE LIGAMENT     SHOULDER ARTHROSCOPY WITH SUBACROMIAL DECOMPRESSION AND OPEN ROTATOR C Right 12/29/2020   Procedure: Right shoulder arthroscopic rotator cuff repair, subacromial decompression, and biceps tenodesis;  Surgeon: Signa Kell, MD;  Location[facility];  Service: Orthopedics;  Laterality: Right;   TONSILLECTOMY     VASECTOMY     Patient Active Problem List   Diagnosis Date Noted   Hypercalcemia 02/19/2023   Weight loss 01/29/2023   Folliculitis 01/29/2023   Extravasation accident 01/06/2023   Impending pathologic fracture 11/26/2022   Neoplasm related pain 11/04/2022   Metastasis to bone (HCC) 09/23/2022   Periodic limb movement disorder 08/19/2022   Skin rash 08/12/2022   Encounter for antineoplastic immunotherapy  07/22/2022   Goals of care, counseling/discussion 06/03/2022   Foot swelling 02/13/2022   Imbalance 02/13/2022   Right shoulder pain 02/13/2022   CPAP use counseling 08/20/2021   Obesity (BMI 30-39.9) 08/20/2021   Type II or unspecified type diabetes mellitus without mention of complication, not stated as uncontrolled 07/30/2021   CKD (chronic kidney disease) stage 3, GFR 30-59 ml/min (HCC) 07/19/2021   History of left nephrectomy 07/08/2021   Cancer of kidney, left (HCC) 06/25/2021   Acute appendicitis 04/27/2021   Pain and swelling of lower leg, right 03/05/2021   Prostate cancer (HCC) 03/05/2021   DNR (do not resuscitate) 06/13/2020   Medicare annual wellness visit, subsequent 06/13/2020   Left hip pain 03/03/2020   Status post rotator cuff surgery 03/03/2020   Type 2 diabetes mellitus with hyperlipidemia (HCC) 02/16/2020   Obesity, morbid (HCC) 02/14/2020   OSA on CPAP 10/15/2019   Status post prostatectomy 06/19/2017   Polyneuropathy 09/17/2016   B12 deficiency 08/13/2016   Lumbar radiculopathy 06/12/2016   Mixed hyperlipidemia 11/30/2015   Essential hypertension 04/26/2015   Memory loss 02/18/2015   History of squamous cell carcinoma of skin 02/15/2015   Family History  Problem Relation Age of Onset   Stroke Mother    Hypertension Father    Emphysema Father    Social History   Tobacco Use   Smoking status: Never    Passive exposure: Never   Smokeless tobacco: Never  Substance Use Topics   Alcohol use: Not Currently   Allergies  Allergen Reactions   Robaxin [Methocarbamol]     ? hives   Tramadol Hives    Possible hives   Current Meds  Medication Sig   ALLEGRA ALLERGY 60 MG tablet Take 60 mg by mouth daily.   cholecalciferol (VITAMIN D3) 25 MCG (1000 UNIT) tablet Take 1,000 Units by mouth daily.   clotrimazole (LOTRIMIN) 1 % cream Apply topically 2 (two) times daily.   Coenzyme Q10 10 MG capsule Take 10 mg by mouth daily.   diphenhydrAMINE-Zinc Acetate  (BENADRYL ITCH RELIEF EX) Apply topically.   docusate sodium (COLACE) 100 MG capsule Take 100 mg by mouth 2 (two) times daily.   dronabinol (MARINOL) 5 MG capsule Take 1 capsule (5 mg total) by mouth 2 (two) times daily before a meal.   ezetimibe (ZETIA) 10 MG tablet Take 1 tablet by mouth daily.   fluticasone (FLONASE) 50 MCG/ACT nasal spray Place 2 sprays into both nostrils daily.   gabapentin (NEURONTIN) 600 MG tablet Take 600 mg by mouth at bedtime.   GINKGO BILOBA COMPLEX PO Take 1 tablet by mouth daily.   hydrochlorothiazide (HYDRODIURIL) 25 MG tablet Take 1 tablet by mouth daily.   losartan (COZAAR) 100 MG tablet Take 100 mg by mouth daily.   Multiple Vitamin (MULTIVITAMIN) capsule Take 1 capsule by mouth daily.   multivitamin-lutein (OCUVITE-LUTEIN) CAPS capsule Take 1 capsule by mouth daily.   Omega-3 Fatty Acids (FISH OIL) 1000 MG CAPS Take 1 capsule by mouth 1 day or  1 dose.   oxyCODONE (OXY IR/ROXICODONE) 5 MG immediate release tablet Take 1 tablet (5 mg total) by mouth every 6 (six) hours as needed for severe pain.   Pembrolizumab (KEYTRUDA IV) Inject into the vein.   polyethylene glycol (MIRALAX / GLYCOLAX) 17 g packet Take 17 g by mouth daily.   triamcinolone cream (KENALOG) 0.1 % Apply 1 Application topically 2 (two) times daily.   Immunization History  Administered Date(s) Administered   Covid-19, Mrna,Vaccine(Spikevax)22yrs and older 08/14/2022   H1N1 07/13/2015   Influenza Inj Mdck Quad Pf 09/14/2020   Influenza, High Dose Seasonal PF 07/13/2015, 08/12/2017, 09/10/2018, 08/01/2021   Influenza-Unspecified 07/13/2015, 07/09/2016, 08/12/2017, 08/14/2022   Moderna Sars-Covid-2 Vaccination 11/10/2019, 12/08/2019, 08/06/2020   Pneumococcal Conjugate-13 11/30/2015   Pneumococcal Polysaccharide-23 12/16/2016   Rsv, Bivalent, Protein Subunit Rsvpref,pf Verdis Frederickson) 08/14/2022   Td 06/13/2020   Tdap 11/30/2007      Objective:   Physical Exam BP 114/76 (BP Location: Right Arm,  Cuff Size: Large)   Pulse 92   Temp 97.9 F (36.6 C)   Ht 5\' 9"  (1.753 m)   Wt 203 lb (92.1 kg)   SpO2 96%   BMI 29.98 kg/m   SpO2: 96 % O2 Device: None (Room air)  GENERAL: Well-developed, well-nourished gentleman, presents in transport chair.  Appears chronically ill. HEAD: Normocephalic, atraumatic.  EYES: Pupils equal, round, reactive to light.  No scleral icterus.  MOUTH: Poor dentition, chipped teeth, oral mucosa moist. NECK: Supple. No thyromegaly. Trachea midline. No JVD.  No adenopathy. PULMONARY: Good air entry bilaterally.  No adventitious sounds. CARDIOVASCULAR: S1 and S2. Regular rate and rhythm.  No rubs, murmurs or gallops heard. ABDOMEN: Benign. MUSCULOSKELETAL: No joint deformity, no clubbing, no edema.  NEUROLOGIC: No overt focal deficit, no gait disturbance, speech is fluent. SKIN: Intact,warm,dry. PSYCH: Mood and behavior normal.  Representative images from CT chest abdomen pelvis performed 12 February 2023, scan independently reviewed:     Representative image of same scan as above showing large hiatal hernia with intrathoracic stomach (arrows):     Assessment & Plan:     ICD-10-CM   1. 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 Large hiatal hernia - itrathoracic stomach  K44.9    This issue adds complexity to his management Set him up for chronic silent aspiration Discontinue fish oils due to aspiration risk Trial of PPI    3. Cancer of kidney, left (HCC)  C64.2    Stage IV On Keytruda Follows with oncology    4. OSA on CPAP  G47.33    Follows with Dr. Welton Flakes     Orders Placed This Encounter  Procedures   CT CHEST WO CONTRAST    In 3 months from last CT on 02/12/2023    Standing Status:   Future    Standing Expiration Date:   03/10/2024    Order Specific Question:   Preferred  imaging location?    Answer:   [facility]   Meds ordered this encounter  Medications   esomeprazole (NEXIUM) 40 MG capsule    Sig: Take 1 capsule (40 mg total) by mouth daily at 12 noon.    Dispense:  30 capsule    Refill:  3   We discussed with the patient and his daughter the potential differential diagnosis of the nodularity and groundglass appearance in the lungs.  I have recommended that the patient's stop using fish oils as regurgitation of oil into the lung can cause this typical picture as well.  Discussed that the potential for checkpoint inhibitor induced pneumonitis is a possibility however the patient has what we call a grade 1 which is only radiographic findings, he is asymptomatic with regards to this finding so this continuation of the checkpoint inhibitor is not necessary at this time.  However the patient does develop symptoms consistent with higher grade of checkpoint inhibitor pneumonitis then the checkpoint inhibitor would have to be discontinued.  Patient was instructed on proper antireflux measures.  Will start PPI as well.  We will see the patient in follow-up in 3 months time he is to contact us prior to that time should any new difficulties arise.  Gailen Shelter, MD Advanced Bronchoscopy PCCM [facility]    *This note was dictated using voice recognition software/Dragon.  Despite best efforts to proofread, errors can occur which can change the meaning. Any transcriptional errors that result from this process are unintentional and may not be fully corrected at the time of dictation.

## 2023-03-11 NOTE — Telephone Encounter (Signed)
Lets do Pantoprazole 40 mg daily

## 2023-03-11 NOTE — Discharge Instructions (Signed)

## 2023-03-11 NOTE — Patient Instructions (Addendum)
Please stop the Fish oil.  We have started a medication to reduce acid in your stomach call Nexium or esomeprazole.  This is 1 capsule daily.  Preferably take it 30 minutes before a meal.  We are going to repeat a CT chest in 3 months time.  We will see her in follow-up after the CT is done.

## 2023-03-11 NOTE — Telephone Encounter (Signed)
I have sent in the prescription and notified the patient.  Nothing further needed. 

## 2023-03-11 NOTE — Telephone Encounter (Signed)
Per Fax from [employer]- the [employer] does not cover Esomeprazole 40mg . The preferred medications are Omeprazole and Pantoprazole.

## 2023-03-12 ENCOUNTER — Inpatient Hospital Stay: Payer: Medicare Other

## 2023-03-12 ENCOUNTER — Other Ambulatory Visit: Payer: Self-pay | Admitting: Oncology

## 2023-03-12 ENCOUNTER — Inpatient Hospital Stay (HOSPITAL_BASED_OUTPATIENT_CLINIC_OR_DEPARTMENT_OTHER): Payer: Medicare Other | Admitting: Oncology

## 2023-03-12 ENCOUNTER — Encounter: Payer: Self-pay | Admitting: Oncology

## 2023-03-12 ENCOUNTER — Other Ambulatory Visit: Payer: Self-pay

## 2023-03-12 ENCOUNTER — Telehealth: Payer: Self-pay | Admitting: *Deleted

## 2023-03-12 VITALS — BP 102/89 | HR 75 | Temp 97.7°F | Resp 18 | Wt 209.2 lb

## 2023-03-12 DIAGNOSIS — C642 Malignant neoplasm of left kidney, except renal pelvis: Secondary | ICD-10-CM | POA: Diagnosis not present

## 2023-03-12 DIAGNOSIS — Z79620 Long term (current) use of immunosuppressive biologic: Secondary | ICD-10-CM | POA: Diagnosis not present

## 2023-03-12 DIAGNOSIS — N1832 Chronic kidney disease, stage 3b: Secondary | ICD-10-CM

## 2023-03-12 DIAGNOSIS — Z5112 Encounter for antineoplastic immunotherapy: Secondary | ICD-10-CM | POA: Diagnosis present

## 2023-03-12 DIAGNOSIS — C7951 Secondary malignant neoplasm of bone: Secondary | ICD-10-CM

## 2023-03-12 DIAGNOSIS — R634 Abnormal weight loss: Secondary | ICD-10-CM

## 2023-03-12 DIAGNOSIS — C61 Malignant neoplasm of prostate: Secondary | ICD-10-CM

## 2023-03-12 DIAGNOSIS — G893 Neoplasm related pain (acute) (chronic): Secondary | ICD-10-CM

## 2023-03-12 LAB — CBC WITH DIFFERENTIAL/PLATELET
Abs Immature Granulocytes: 0.05 10*3/uL (ref 0.00–0.07)
Basophils Absolute: 0.1 10*3/uL (ref 0.0–0.1)
Basophils Relative: 1 %
Eosinophils Absolute: 0.3 10*3/uL (ref 0.0–0.5)
Eosinophils Relative: 4 %
HCT: 41.6 % (ref 39.0–52.0)
Hemoglobin: 14.3 g/dL (ref 13.0–17.0)
Immature Granulocytes: 1 %
Lymphocytes Relative: 12 %
Lymphs Abs: 0.9 10*3/uL (ref 0.7–4.0)
MCH: 30.1 pg (ref 26.0–34.0)
MCHC: 34.4 g/dL (ref 30.0–36.0)
MCV: 87.6 fL (ref 80.0–100.0)
Monocytes Absolute: 0.8 10*3/uL (ref 0.1–1.0)
Monocytes Relative: 11 %
Neutro Abs: 5.3 10*3/uL (ref 1.7–7.7)
Neutrophils Relative %: 71 %
Platelets: 194 10*3/uL (ref 150–400)
RBC: 4.75 MIL/uL (ref 4.22–5.81)
RDW: 12.4 % (ref 11.5–15.5)
WBC: 7.4 10*3/uL (ref 4.0–10.5)
nRBC: 0 % (ref 0.0–0.2)

## 2023-03-12 LAB — COMPREHENSIVE METABOLIC PANEL WITH GFR
ALT: 26 U/L (ref 0–44)
AST: 19 U/L (ref 15–41)
Albumin: 3.7 g/dL (ref 3.5–5.0)
Alkaline Phosphatase: 49 U/L (ref 38–126)
Anion gap: 12 (ref 5–15)
BUN: 35 mg/dL — ABNORMAL HIGH (ref 8–23)
CO2: 25 mmol/L (ref 22–32)
Calcium: 10.4 mg/dL — ABNORMAL HIGH (ref 8.9–10.3)
Chloride: 94 mmol/L — ABNORMAL LOW (ref 98–111)
Creatinine, Ser: 1.47 mg/dL — ABNORMAL HIGH (ref 0.61–1.24)
GFR, Estimated: 49 mL/min — ABNORMAL LOW
Glucose, Bld: 142 mg/dL — ABNORMAL HIGH (ref 70–99)
Potassium: 3.6 mmol/L (ref 3.5–5.1)
Sodium: 131 mmol/L — ABNORMAL LOW (ref 135–145)
Total Bilirubin: 0.9 mg/dL (ref 0.3–1.2)
Total Protein: 6.5 g/dL (ref 6.5–8.1)

## 2023-03-12 LAB — TSH: TSH: 1.527 u[IU]/mL (ref 0.350–4.500)

## 2023-03-12 LAB — T4, FREE: Free T4: 1.03 ng/dL (ref 0.61–1.12)

## 2023-03-12 MED ORDER — SODIUM CHLORIDE 0.9 % IV SOLN
200.0000 mg | Freq: Once | INTRAVENOUS | Status: AC
  Administered 2023-03-12: 200 mg via INTRAVENOUS
  Filled 2023-03-12: qty 200

## 2023-03-12 MED ORDER — DENOSUMAB 120 MG/1.7ML ~~LOC~~ SOLN
120.0000 mg | Freq: Once | SUBCUTANEOUS | Status: AC
  Administered 2023-03-12: 120 mg via SUBCUTANEOUS
  Filled 2023-03-12: qty 1.7

## 2023-03-12 MED ORDER — SODIUM CHLORIDE 0.9 % IV SOLN
Freq: Once | INTRAVENOUS | Status: AC
  Filled 2023-03-12: qty 250

## 2023-03-12 NOTE — Assessment & Plan Note (Signed)
Encourage oral hydration, avoid nephrotoxins 

## 2023-03-12 NOTE — Assessment & Plan Note (Signed)
Repeat PSA 

## 2023-03-12 NOTE — Progress Notes (Signed)
Patient here for follow up. Pt reports that he saw Dr. Jayme Cloud yesterday and that he also had an injection to his back.

## 2023-03-12 NOTE — Assessment & Plan Note (Signed)
oxycodone 5mg  Q6h PRN

## 2023-03-12 NOTE — Telephone Encounter (Signed)
Per Lanora Manis, pt states that [facility] nursing never came to his home. I personally reached out to [facility] with [facility]. Per Barbara Cower, it was documented in their system that pt had 1 rn visit. He was currently being serviced by PT/OT.  I spoke with pt's daughter regarding pt's current HH needs. Pt is wanting a nursing aide and someone to come make his meals and bathe him daily. I explained that [facility] services to do not cover meals. I provided pt with contact with meals on wheels and other community resources. I also provided family with a contact of a HH agency. Per Barbara Cower at [facility] -pts have been referred to [facility]) in the past for hired personal aides in home [phone]). Daughter thanked me for the information.

## 2023-03-12 NOTE — Progress Notes (Signed)
Hematology/Oncology Progress note [phone] [phone]   CHIEF COMPLAINTS/REASON FOR VISIT:  RCC  ASSESSMENT & PLAN:   Cancer Staging  Cancer of kidney, left [facility]) Staging form: Kidney, AJCC 8th Edition - Clinical stage from 06/25/2021: Stage III (cT3a, cNX, cM0) - Signed by Rickard Patience, MD on 08/06/2021   Cancer of kidney, left [facility]) History of stage III left RCC, Lytic bone lesion of L2 and intertrochanteric left femur, consistent with osseous metastasis. Biopsy of L2 + metastatic carcinoma, stage IV, limited disease burden.  Currently on palliative immunotherapy So far no visceral metastatic lesions, however, bone lesions are progressing. -  Continue immunotherapy with local therapies of bone lesion.  Labs are reviewed and discussed with patient. Proceed with Keytruda.     Metastasis to bone [facility]) S/p  SBRT to L2 and left femur.  Follow up with orthopedic surgeon - s/p Left IM nail femur surgery.  right ischium lesion-- discussed with Dr. Juliette Alcide, he does not think his symptoms  are directly from bone pain related to his metastatic disease, and therefore RFA is less likely to be helpful.  Right L2-L3 neuroforamen, s/p Right L2/L3 Nerve Root Block by IR 03/11/23 S/p right L3-L4 nerve root block by IR Continue Xgeva, Q6 weeks.   Prostate cancer (HCC) Repeat PSA  CKD (chronic kidney disease) stage 3, GFR 30-59 ml/min (HCC) Encourage oral hydration, avoid nephrotoxins  Encounter for antineoplastic immunotherapy Treatment plan listed above.  Monitor immunotherapy side effects  Neoplasm related pain oxycodone 5mg  Q6h PRN     Weight loss Follow up with nutritionist  Recommend healthy diet with high protein.  BMI 30    Hypercalcemia Calcium is elevated.  Recommend patient to hold off calcium supplementation    Orders Placed This Encounter  Procedures   CBC with Differential    Standing Status:   Future    Standing Expiration Date:   04/01/2024    Comprehensive metabolic panel    Standing Status:   Future    Standing Expiration Date:   04/01/2024   CBC with Differential    Standing Status:   Future    Standing Expiration Date:   04/22/2024   Comprehensive metabolic panel    Standing Status:   Future    Standing Expiration Date:   04/22/2024   Follow up 3 weeks lab MD Keytruda All questions were answered. The patient knows to call the clinic with any problems, questions or concerns.  Rickard Patience, MD, PhD [facility] Hematology Oncology 03/12/2023    HISTORY OF PRESENTING ILLNESS:   Peter Stuart is a  77 y.o.  male presents for follow-up of history of left RCC and history of prostate cancer. Oncology history summary listed as below. Oncology History  Cancer of kidney, left (HCC)  2009 Initial Diagnosis   History of prostate cancer treated with robotic prostatectomy initial   04/27/2021 Imaging   CT abdomen pelvis showed acute appendicitis without evidence of perforation or abscess.  Large left renal mass consistent with renal malignancy with renal vein invasion.  Focal annular thickening of descending colon.  05/01/2021 CT chest with contrast showed large sliding-type hiatal hernia.  Mild bilateral posterior bibasilar subsegmental atelectasis   06/25/2021 Initial Diagnosis   Cancer of kidney, left  06/25/2021, patient underwent radical nephrectomy by Dr. Richardo Hanks. Pathology showed clear-cell renal cell carcinoma, nuclear grade 2.  Benign adrenal tissue.  Lymphovascular invasion present. pT3a pNx    06/25/2021 Cancer Staging   Staging form: Kidney, AJCC 8th Edition - Clinical  stage from 06/25/2021: Stage III (cT3a, cNX, cM0) - Signed by Rickard Patience, MD on 08/06/2021 Stage prefix: Initial diagnosis   05/29/2022 Imaging   CT chest abdomen pelvis with contrast showed -1. New lytic lesions in the L2 vertebral body and intertrochanteric left femur are most consistent with osseous metastatic disease. 2. Prior left nephrectomy with unchanged  size of the soft tissue and fluid collection in the nephrectomy bed favored to reflect postsurgical change given stability. Continued attention on follow-up imaging suggested. 3. Small lucent focus in the T11 vertebral body is nonspecific but stable from prior imaging suggestive of a benign etiology, attention on follow-up imaging suggested. 4. Stable small bilateral pulmonary nodules, continued attention on follow-up imaging suggested. 5. Large hiatal hernia/intrathoracic stomach also containing a portion of the pancreatic body. 6. Prior prostatectomy without suspicious enhancing nodularity in the prostatectomy bed.   07/15/2022 Procedure   L2 vertebral body CT guided biopsy showed positive for malignancy, metastatic carcinoma, morphologically compatible with known clear cell renal cell carcinoma.    07/19/2022 -  Chemotherapy   RENAL CELL Pembrolizumab (200)      10/30/2022 - 11/18/2022 Radiation Therapy   Status post SBRT to both L2 as well as left femur.   11/26/2022 Surgery   Status post left intramedullary nail   02/14/2023 Imaging   CT chest abdomen pelvis wo contrast  1. Interval development of areas of ground-glass attenuation and nodularity in the lungs bilaterally. This is nonspecific. In the appropriate clinical setting, this could represent atypical infection, particularly if the patient is immunocompromised. Alternatively, the possibility of metastatic disease with peritumoral hemorrhage is not entirely excluded. Further clinical evaluation is recommended, along with close attention on follow-up studies to ensure the regression of these findings. 2. Previously noted osseous lesions appear very similar to the prior study, as above. No definite new osseous lesions are otherwise noted. 3. No definite signs of metastatic disease noted in the abdomen or pelvis. 4. Cholelithiasis without evidence of acute cholecystitis. 5. Large hiatal hernia with nearly completely intrathoracic stomach,  similar to the prior study. 6. Aortic atherosclerosis, in addition to left anterior descending coronary artery disease. Assessment for potential risk factor modification, dietary therapy or pharmacologic therapy may be warranted, if clinically indicated. 7. Additional incidental findings, as above.   Metastasis to bone (HCC)  09/23/2022 Initial Diagnosis   Metastasis to bone [facility])    INTERVAL HISTORY Rosalio Charlet is a 77 y.o. male who has above history reviewed by me today presents for follow up visit for metastatic RCC - he takes oxycodone Q6h PRN.  - 02/12/23 s/p Right L2/L3 Nerve Root Block pain has improved and got worse again.  - 03/11/23 S/p right L3-L4 nerve root block by IR, pain is slightly better He does not eat well, partially due to not able to cook for himself due to the pain.  Daughter helps to bring in food.    Review of Systems  Constitutional:  Negative for appetite change, chills, fever and unexpected weight change.  HENT:   Negative for hearing loss and voice change.   Eyes:  Negative for eye problems and icterus.  Respiratory:  Negative for chest tightness, cough and shortness of breath.   Cardiovascular:  Negative for chest pain and leg swelling.  Gastrointestinal:  Negative for abdominal distention and abdominal pain.  Endocrine: Negative for hot flashes.  Genitourinary:  Negative for difficulty urinating, dysuria and frequency.   Musculoskeletal:  Positive for back pain. Negative for arthralgias.  Right hip pain  Skin:  Negative for itching.  Neurological:  Negative for light-headedness and numbness.  Hematological:  Negative for adenopathy. Does not bruise/bleed easily.  Psychiatric/Behavioral:  Negative for confusion.     MEDICAL HISTORY:  Past Medical History:  Diagnosis Date   Anemia    Cancer (HCC)    Cancer of kidney (HCC)    CKD (chronic kidney disease) stage 3, GFR 30-59 ml/min (HCC)    Complication of anesthesia    slow to wake after 1  surgery   CPAP use counseling    Diabetes mellitus without complication (HCC)    HLD (hyperlipidemia)    HTN (hypertension)    OSA on CPAP    PONV (postoperative nausea and vomiting)    Prostate cancer (HCC)    Seasonal allergies    Skin cancer     SURGICAL HISTORY: Past Surgical History:  Procedure Laterality Date   FEMUR IM NAIL Left 11/26/2022   Procedure: LEFT INTRAMEDULLARY (IM) NAIL FEMORAL;  Surgeon: Lyndle Herrlich, MD;  Location: [facility] ORS;  Service: Orthopedics;  Laterality: Left;   HERNIA REPAIR     IR FLUORO GUIDED NEEDLE PLC ASPIRATION/INJECTION LOC  07/02/2022   IR RADIOLOGIST EVAL & MGMT  02/11/2023   IR RADIOLOGIST EVAL & MGMT  03/11/2023   LAPAROSCOPIC APPENDECTOMY N/A 04/27/2021   Procedure: APPENDECTOMY LAPAROSCOPIC;  Surgeon: Duanne Guess, MD;  Location: [facility] ORS;  Service: General;  Laterality: N/A;   LAPAROSCOPIC NEPHRECTOMY, HAND ASSISTED Left 06/25/2021   Procedure: HAND ASSISTED LAPAROSCOPIC NEPHRECTOMY;  Surgeon: Sondra Come, MD;  Location: [facility] ORS;  Service: Urology;  Laterality: Left;   prostatectomy     REPAIR KNEE LIGAMENT     SHOULDER ARTHROSCOPY WITH SUBACROMIAL DECOMPRESSION AND OPEN ROTATOR C Right 12/29/2020   Procedure: Right shoulder arthroscopic rotator cuff repair, subacromial decompression, and biceps tenodesis;  Surgeon: Signa Kell, MD;  Location: [facility];  Service: Orthopedics;  Laterality: Right;   TONSILLECTOMY     VASECTOMY      SOCIAL HISTORY: Social History   Socioeconomic History   Marital status: Widowed    Spouse name: Not on file   Number of children: Not on file   Years of education: Not on file   Highest education level: Not on file  Occupational History   Not on file  Tobacco Use   Smoking status: Never    Passive exposure: Never   Smokeless tobacco: Never  Vaping Use   Vaping Use: Never used  Substance and Sexual Activity   Alcohol use: Not Currently   Drug use: Never   Sexual activity: Not  Currently  Other Topics Concern   Not on file  Social History Narrative   Not on file   Social Determinants of Health   Financial Resource Strain: Not on file  Food Insecurity: No Food Insecurity (11/27/2022)   Hunger Vital Sign    Worried About Running Out of Food in the Last Year: Never true    Ran Out of Food in the Last Year: Never true  Transportation Needs: No Transportation Needs (11/27/2022)   PRAPARE - Administrator, Civil Service (Medical): No    Lack of Transportation (Non-Medical): No  Physical Activity: Not on file  Stress: Not on file  Social Connections: Not on file  Intimate Partner Violence: Not At Risk (11/27/2022)   Humiliation, Afraid, Rape, and Kick questionnaire    Fear of Current or Ex-Partner: No    Emotionally Abused: No  Physically Abused: No    Sexually Abused: No    FAMILY HISTORY: Family History  Problem Relation Age of Onset   Stroke Mother    Hypertension Father    Emphysema Father     ALLERGIES:  is allergic to robaxin [methocarbamol] and tramadol.  MEDICATIONS:  Current Outpatient Medications  Medication Sig Dispense Refill   ALLEGRA ALLERGY 60 MG tablet Take 60 mg by mouth daily.     cholecalciferol (VITAMIN D3) 25 MCG (1000 UNIT) tablet Take 1,000 Units by mouth daily.     clotrimazole (LOTRIMIN) 1 % cream Apply topically 2 (two) times daily.     Coenzyme Q10 10 MG capsule Take 10 mg by mouth daily.     diphenhydrAMINE-Zinc Acetate (BENADRYL ITCH RELIEF EX) Apply topically.     docusate sodium (COLACE) 100 MG capsule Take 100 mg by mouth 2 (two) times daily.     ezetimibe (ZETIA) 10 MG tablet Take 1 tablet by mouth daily.     fluticasone (FLONASE) 50 MCG/ACT nasal spray Place 2 sprays into both nostrils daily.     gabapentin (NEURONTIN) 600 MG tablet Take 600 mg by mouth at bedtime.     GINKGO BILOBA COMPLEX PO Take 1 tablet by mouth daily.     hydrochlorothiazide (HYDRODIURIL) 25 MG tablet Take 1 tablet by mouth daily.      losartan (COZAAR) 100 MG tablet Take 100 mg by mouth daily.     Multiple Vitamin (MULTIVITAMIN) capsule Take 1 capsule by mouth daily.     multivitamin-lutein (OCUVITE-LUTEIN) CAPS capsule Take 1 capsule by mouth daily.     oxyCODONE (OXY IR/ROXICODONE) 5 MG immediate release tablet Take 1 tablet (5 mg total) by mouth every 6 (six) hours as needed for severe pain. 120 tablet 0   pantoprazole (PROTONIX) 40 MG tablet Take 1 tablet (40 mg total) by mouth daily. 30 tablet 2   Pembrolizumab (KEYTRUDA IV) Inject into the vein.     polyethylene glycol (MIRALAX / GLYCOLAX) 17 g packet Take 17 g by mouth daily.     triamcinolone cream (KENALOG) 0.1 % Apply 1 Application topically 2 (two) times daily.     dronabinol (MARINOL) 5 MG capsule Take 1 capsule (5 mg total) by mouth 2 (two) times daily before a meal. (Patient not taking: Reported on 03/12/2023) 60 capsule 0   No current facility-administered medications for this visit.     PHYSICAL EXAMINATION: ECOG PERFORMANCE STATUS: 1 - Symptomatic but completely ambulatory Vitals:   03/12/23 0842  BP: 102/89  Pulse: 75  Resp: 18  Temp: 97.7 F (36.5 C)   Filed Weights   03/12/23 0842  Weight: 209 lb 3.2 oz (94.9 kg)    Physical Exam Constitutional:      General: He is not in acute distress.    Appearance: He is obese.  HENT:     Head: Normocephalic and atraumatic.  Eyes:     General: No scleral icterus. Cardiovascular:     Rate and Rhythm: Normal rate.     Heart sounds: Normal heart sounds.  Pulmonary:     Effort: Pulmonary effort is normal. No respiratory distress.     Breath sounds: No wheezing.  Abdominal:     General: Bowel sounds are normal. There is no distension.     Palpations: Abdomen is soft.  Musculoskeletal:        General: No deformity. Normal range of motion.     Cervical back: Normal range of motion and neck supple.  Skin:    General: Skin is warm and dry.     Findings: No erythema.  Neurological:     Mental  Status: He is alert and oriented to person, place, and time. Mental status is at baseline.     Cranial Nerves: No cranial nerve deficit.     Coordination: Coordination normal.  Psychiatric:        Mood and Affect: Mood normal.     LABORATORY DATA:  I have reviewed the data as listed    Latest Ref Rng & Units 03/12/2023    8:20 AM 02/19/2023    8:46 AM 01/29/2023    8:51 AM  CBC  WBC 4.0 - 10.5 K/uL 7.4  5.5  9.6   Hemoglobin 13.0 - 17.0 g/dL 16.1  09.6  04.5   Hematocrit 39.0 - 52.0 % 41.6  42.5  41.9   Platelets 150 - 400 K/uL 194  160  199       Latest Ref Rng & Units 03/12/2023    8:20 AM 02/19/2023    8:46 AM 01/29/2023    8:51 AM  CMP  Glucose 70 - 99 mg/dL 409  811  914   BUN 8 - 23 mg/dL 35  28  27   Creatinine 0.61 - 1.24 mg/dL 7.82  9.56  2.13   Sodium 135 - 145 mmol/L 131  131  133   Potassium 3.5 - 5.1 mmol/L 3.6  3.5  3.4   Chloride 98 - 111 mmol/L 94  94  95   CO2 22 - 32 mmol/L 25  29  26    Calcium 8.9 - 10.3 mg/dL 08.6  57.8  46.9   Total Protein 6.5 - 8.1 g/dL 6.5  6.7  6.8   Total Bilirubin 0.3 - 1.2 mg/dL 0.9  0.8  0.7   Alkaline Phos 38 - 126 U/L 49  55  62   AST 15 - 41 U/L 19  22  22    ALT 0 - 44 U/L 26  20  26      RADIOGRAPHIC STUDIES: I have personally reviewed the radiological images as listed and agreed with the findings in the report. DG NERVE ROOT BLOCK LUMBAR SACRAL  Result Date: 03/11/2023 CLINICAL DATA:  Right lower extremity radiculopathy in the setting of osseous metastatic disease with nerve root compression; previous right L2-L3 nerve root block with moderate improvement in symptoms that lasted for 2 weeks, now presents for second injection at L3-L4 based on review of imaging FLUOROSCOPY: Radiation Exposure Index (as provided by the fluoroscopic device): 1.0 minutes (6 mGy) PROCEDURE: The procedure, risks, benefits, and alternatives were explained to the patient. Questions regarding the procedure were encouraged and answered. The patient  understands and consents to the procedure. LUMBAR EPIDURAL INJECTION: A transforaminal approach was performed on right at L3-L4. The overlying skin was cleansed and anesthetized. A 3 22 gauge spinal needle was advanced towards the identified neural foramen using anatomic landmarks. DIAGNOSTIC EPIDURAL INJECTION: Injection of Isovue-M 200 shows a good epidural pattern with spread along the nerve root sleeve. No vascular opacification is seen. THERAPEUTIC EPIDURAL INJECTION: 80 mg of Depo-Medrol mixed with 1 mL 1% lidocaine were instilled. The procedure was well-tolerated, and the patient was discharged thirty minutes following the injection in good condition. COMPLICATIONS: None immediate IMPRESSION: Successful epidural steroid injection/selective nerve root block of the right L3-L4 nerve root via a transforaminal approach. Electronically Signed   By: Olive Bass M.D.   On: 03/11/2023 16:41  IR Radiologist Eval & Mgmt  Result Date: 03/11/2023 EXAM: ESTABLISHED PATIENT OFFICE VISIT CHIEF COMPLAINT: Documented in the EMR HISTORY OF PRESENT ILLNESS: Patient follows up with me today for right lower extremity radiculopathy caused by known metastatic renal cancer involving the L2 vertebral body with extension into the neural foramina. Patient underwent subsequent right L2-L3 nerve root block on February 12, 2023. Patient reported moderate improvement in symptoms, with his right lower extremity pain improving from 6/10 to 4/10. His relief lasted approximately 2 weeks. His pain has now returned close to his baseline, at around 6/10. His pain is worse in the mornings with mild improvement with oxycodone. His pain is better in the afternoons in the evenings. He does have some pain in the lower back that is intermittent, but he reports that his more significant pain is his radiculopathy towards his right hip and right knee. It is particularly bothersome in the mornings when he is walking or standing to prepare food in the  kitchen. Given his moderate improvement in symptoms following his first nerve root block, will plan to perform a second nerve root block at an adjacent level given coexisting involvement at L3-L4 on the right side. REVIEW OF SYSTEMS: Documented in the EMR PHYSICAL EXAMINATION: Documented in the EMR ASSESSMENT AND PLAN: Documented in the EMR Electronically Signed   By: Olive Bass M.D.   On: 03/11/2023 09:55   CT CHEST ABDOMEN PELVIS WO CONTRAST  Result Date: 02/14/2023 CLINICAL DATA:  77 year old male with history of left-sided renal cell carcinoma. Follow-up study. * Tracking Code: BO * EXAM: CT CHEST, ABDOMEN AND PELVIS WITHOUT CONTRAST TECHNIQUE: Multidetector CT imaging of the chest, abdomen and pelvis was performed following the standard protocol without IV contrast. RADIATION DOSE REDUCTION: This exam was performed according to the departmental dose-optimization program which includes automated exposure control, adjustment of the mA and/or kV according to patient size and/or use of iterative reconstruction technique. COMPARISON:  CT of the chest, abdomen and pelvis 11/15/2022. FINDINGS: Comment: Today's study is limited for detection and characterization of visceral and/or vascular lesions by lack of IV contrast. CT CHEST FINDINGS Cardiovascular: Heart size is normal. There is no significant pericardial fluid, thickening or pericardial calcification. There is aortic atherosclerosis, as well as atherosclerosis of the great vessels of the mediastinum and the coronary arteries, including calcified atherosclerotic plaque in the left anterior descending coronary artery. Mediastinum/Nodes: No pathologically enlarged mediastinal or hilar lymph nodes. Please note that accurate exclusion of hilar adenopathy is limited on noncontrast CT scans. Large hiatal hernia with nearly completely intrathoracic stomach, similar to the prior study. Lungs/Pleura: Multiple new areas of ground-glass attenuation and nodularity  scattered throughout the lungs bilaterally. The largest of these is in the right lower lobe (axial image 104 of series 4) measuring 1.3 x 0.9 cm with a small amount of surrounding ground-glass attenuation. On the left, the largest lesion is in the anterior aspect of the left upper lobe (axial image 61 of series 4) measuring 9 x 8 mm. No pleural effusions. Small left Bochdalek's hernia. Musculoskeletal: 6 mm sclerotic lesion in T5 vertebral body, unchanged. There are no other new aggressive appearing lytic or blastic lesions noted in the visualized portions of the skeleton. CT ABDOMEN PELVIS FINDINGS Hepatobiliary: No definite suspicious cystic or solid hepatic lesions are confidently identified on today's noncontrast CT examination. Partially cavitary noncalcified gallstones are noted in the lumen of the gallbladder. Gallbladder is moderately distended. No pericholecystic fluid or surrounding inflammatory changes. Pancreas: Fatty atrophy in  the pancreas. No definite pancreatic mass or peripancreatic fluid collections or inflammatory changes are noted on today's noncontrast CT examination. Spleen: Unremarkable. Adrenals/Urinary Tract: Status post left radical nephrectomy. No definite focal soft tissue mass in the nephrectomy bed to suggest locally recurrent disease. Unenhanced appearance of the right kidney and right adrenal gland is unremarkable. Left adrenal gland is not confidently identified. No right hydroureteronephrosis. Urinary bladder is unremarkable in appearance. Stomach/Bowel: Nearly completely intrathoracic stomach, similar to the prior study. No pathologic dilatation of small bowel or colon. Numerous colonic diverticula are noted, particularly in the sigmoid colon, without surrounding inflammatory changes to indicate an acute diverticulitis at this time. Status post appendectomy. Vascular/Lymphatic: Aortic atherosclerosis. No lymphadenopathy noted in the abdomen or pelvis. Reproductive: Status post  radical prostatectomy. Other: No significant volume of ascites.  No pneumoperitoneum. Musculoskeletal: Large lytic lesion in the right-side of the L2 vertebral body appears grossly similar to the prior examination, better demonstrated by recent lumbar spine MRI 01/15/2023. New postoperative changes of ORIF are noted in the left proximal femur traversing the previously noted lytic lesion in the left femoral neck which is now largely obscured by beam hardening artifact. No other definite new aggressive appearing lytic or blastic lesions are noted elsewhere in the visualized portions of the skeleton. IMPRESSION: 1. Interval development of areas of ground-glass attenuation and nodularity in the lungs bilaterally. This is nonspecific. In the appropriate clinical setting, this could represent atypical infection, particularly if the patient is immunocompromised. Alternatively, the possibility of metastatic disease with peritumoral hemorrhage is not entirely excluded. Further clinical evaluation is recommended, along with close attention on follow-up studies to ensure the regression of these findings. 2. Previously noted osseous lesions appear very similar to the prior study, as above. No definite new osseous lesions are otherwise noted. 3. No definite signs of metastatic disease noted in the abdomen or pelvis. 4. Cholelithiasis without evidence of acute cholecystitis. 5. Large hiatal hernia with nearly completely intrathoracic stomach, similar to the prior study. 6. Aortic atherosclerosis, in addition to left anterior descending coronary artery disease. Assessment for potential risk factor modification, dietary therapy or pharmacologic therapy may be warranted, if clinically indicated. 7. Additional incidental findings, as above. Electronically Signed   By: Trudie Reed M.D.   On: 02/14/2023 12:36   DG NERVE ROOT BLOCK LUMBAR SACRAL  Result Date: 02/12/2023 CLINICAL DATA:  Right L2-3 nerve root block was requested.  Patient with bilateral bandlike lower back pain and radiation to the right anterior thigh. Lumbar spine MRI dated 01/15/2023 demonstrates a metastasis to the right aspect of the L2 vertebral body with mild mass effect on the right L2 nerve. Additionally there is moderate neural foraminal narrowing at L3-4 on the right. We will start with the right L2 selective nerve root block with L2-3 transforaminal epidural steroid injection. EXAM: EXAM Right L2-3 NERVE ROOT BLOCK WITH TRANSFORAMINAL EPIDURAL STEROID INJECTION TECHNIQUE: Overlying skin prepped with Betadine, draped in the usual sterile fashion. Curved 22 gauge spinal needle directed into the right L2-3 neuroforamen. Diagnostic injection with 3ml Isovue-M 200 contrast demonstrates good epidural spread partially outlining the right L2 nerve root. No intravascular uptake. 80 mg Depo-Medrol and 2 mL 1% lidocaine were subsequently administered. No immediate complication. The patient tolerated the procedure without difficulty. They were transferred to recovery in excellent condition. FLUOROSCOPY: Radiation Exposure Index (as provided by the fluoroscopic device): 1.70 mGy Kerma. Fluoroscopy time: 21 seconds. IMPRESSION: Technically successful right L2 selective nerve root block with transforaminal epidural steroid injection. Patient  reported immediate improvement in symptoms following the injection. If the patient does not receive durable pain relief from this injection, right L3 selective nerve root block with L3-4 transforaminal epidural steroid injection could be considered at the next visit. Electronically Signed   By: Orvan Falconer M.D.   On: 02/12/2023 14:21   IR Radiologist Eval & Mgmt  Result Date: 02/11/2023 EXAM: NEW PATIENT OFFICE VISIT CHIEF COMPLAINT: Documented in the EMR HISTORY OF PRESENT ILLNESS: Patient presents today as a referral from oncology for further evaluation and management of his right lower extremity pain. Patient reports history of  renal cancer with osseous metastatic disease, with previous surgical pinning/fixation of his left hip and femur. Subsequently, he developed right lower extremity radiating pain. He states that the pain radiates from his buttocks/hips to his knee. MRI of both is lumbar spine and sacrum was performed on January 15, 2023. These demonstrated lesions of both the L2 vertebral body and the right ischium. He denies any significant back pain or tenderness. He does complain of some lumbosacral soreness. He denies any pelvic pain or buttock pain when he is in the seated position. REVIEW OF SYSTEMS: Documented in the EMR PHYSICAL EXAMINATION: Documented in the EMR ASSESSMENT AND PLAN: Documented in the EMR Electronically Signed   By: Olive Bass M.D.   On: 02/11/2023 13:12

## 2023-03-12 NOTE — Assessment & Plan Note (Signed)
Calcium is elevated.  Recommend patient to hold off calcium supplementation

## 2023-03-12 NOTE — Progress Notes (Signed)
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  Patient on pembrolizumab.  Met with patient during infusion.  Decreased appetite.  Noted Dr Jayme Cloud prescribed pantoprazole.  Reports that he ate about 50% of [employer] salad last night.  Says that he is in so much back pain that it is hard for him to prepare meals.  Family is bringing him food to eat.  Drinking 350 calorie shake 1 time a day and lower calorie shake 1 time a day.  Received steriod injection yesterday for back pain.      Medications: reviewed  Labs: Na 131, glucose 142, BUN 35, creatinine 1.47, calcium 10.4  Anthropometrics:   Weight 209 lb 3.2 oz today  216 lb 6 oz 5/1 231 lb on 3/20 245 lb on 10/09/22   NUTRITION DIAGNOSIS: Unintentional weight loss improving    INTERVENTION:  Recommend increase 350 calorie shake to BID. Does not think he can do that due to richness of 350 calorie shake Wanting more suggestions of foods to eat with taking pain medication.  (Yogurt, ham and cheese sandwich, chicken salad, tuna salad, egg salad). Does not like peanut butter.     MONITORING, EVALUATION, GOAL: weight trends, intake   NEXT VISIT: Wednesday, June 26 during infusion  Artemio Dobie B. Freida Busman, RD, LDN Registered Dietitian [phone]

## 2023-03-12 NOTE — Assessment & Plan Note (Addendum)
S/p  SBRT to L2 and left femur.  Follow up with orthopedic surgeon - s/p Left IM nail femur surgery.  right ischium lesion-- discussed with Dr. Juliette Alcide, he does not think his symptoms  are directly from bone pain related to his metastatic disease, and therefore RFA is less likely to be helpful.  Right L2-L3 neuroforamen, s/p Right L2/L3 Nerve Root Block by IR 03/11/23 S/p right L3-L4 nerve root block by IR Continue Xgeva, Q6 weeks.

## 2023-03-12 NOTE — Assessment & Plan Note (Signed)
Treatment plan listed above.  Monitor immunotherapy side effects 

## 2023-03-12 NOTE — Assessment & Plan Note (Signed)
Follow up with nutritionist  Recommend healthy diet with high protein.  BMI 30

## 2023-03-12 NOTE — Assessment & Plan Note (Addendum)
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 Biopsy of L2 + metastatic carcinoma, stage IV, limited disease burden.  Currently on palliative immunotherapy So far no visceral metastatic lesions, however, bone lesions are progressing. -  Continue immunotherapy with local therapies of bone lesion.  Labs are reviewed and discussed with patient. Proceed with Keytruda.

## 2023-03-15 ENCOUNTER — Encounter: Payer: Self-pay | Admitting: Pulmonary Disease

## 2023-03-26 ENCOUNTER — Encounter: Payer: Self-pay | Admitting: Oncology

## 2023-03-27 ENCOUNTER — Telehealth: Payer: Self-pay | Admitting: *Deleted

## 2023-03-27 ENCOUNTER — Inpatient Hospital Stay: Payer: Medicare Other

## 2023-03-27 ENCOUNTER — Inpatient Hospital Stay (HOSPITAL_BASED_OUTPATIENT_CLINIC_OR_DEPARTMENT_OTHER): Payer: Medicare Other | Admitting: Nurse Practitioner

## 2023-03-27 ENCOUNTER — Encounter: Payer: Self-pay | Admitting: Nurse Practitioner

## 2023-03-27 VITALS — BP 111/82 | HR 73 | Temp 98.7°F

## 2023-03-27 DIAGNOSIS — G893 Neoplasm related pain (acute) (chronic): Secondary | ICD-10-CM

## 2023-03-27 DIAGNOSIS — K5903 Drug induced constipation: Secondary | ICD-10-CM | POA: Diagnosis not present

## 2023-03-27 DIAGNOSIS — C642 Malignant neoplasm of left kidney, except renal pelvis: Secondary | ICD-10-CM

## 2023-03-27 DIAGNOSIS — C7951 Secondary malignant neoplasm of bone: Secondary | ICD-10-CM

## 2023-03-27 DIAGNOSIS — T402X5A Adverse effect of other opioids, initial encounter: Secondary | ICD-10-CM

## 2023-03-27 DIAGNOSIS — R634 Abnormal weight loss: Secondary | ICD-10-CM

## 2023-03-27 DIAGNOSIS — Z5112 Encounter for antineoplastic immunotherapy: Secondary | ICD-10-CM | POA: Diagnosis not present

## 2023-03-27 LAB — CMP (CANCER CENTER ONLY)
ALT: 18 U/L (ref 0–44)
AST: 22 U/L (ref 15–41)
Albumin: 3.8 g/dL (ref 3.5–5.0)
Alkaline Phosphatase: 51 U/L (ref 38–126)
Anion gap: 14 (ref 5–15)
BUN: 35 mg/dL — ABNORMAL HIGH (ref 8–23)
CO2: 23 mmol/L (ref 22–32)
Calcium: 9.2 mg/dL (ref 8.9–10.3)
Chloride: 93 mmol/L — ABNORMAL LOW (ref 98–111)
Creatinine: 1.42 mg/dL — ABNORMAL HIGH (ref 0.61–1.24)
GFR, Estimated: 51 mL/min — ABNORMAL LOW (ref >60)
Glucose, Bld: 168 mg/dL — ABNORMAL HIGH (ref 70–99)
Potassium: 3.7 mmol/L (ref 3.5–5.1)
Sodium: 130 mmol/L — ABNORMAL LOW (ref 135–145)
Total Bilirubin: 0.8 mg/dL (ref 0.3–1.2)
Total Protein: 7.1 g/dL (ref 6.5–8.1)

## 2023-03-27 LAB — CBC WITH DIFFERENTIAL (CANCER CENTER ONLY)
Abs Immature Granulocytes: 0.05 10*3/uL (ref 0.00–0.07)
Basophils Absolute: 0.1 10*3/uL (ref 0.0–0.1)
Basophils Relative: 1 %
Eosinophils Absolute: 0.3 10*3/uL (ref 0.0–0.5)
Eosinophils Relative: 4 %
HCT: 45.7 % (ref 39.0–52.0)
Hemoglobin: 15.6 g/dL (ref 13.0–17.0)
Immature Granulocytes: 1 %
Lymphocytes Relative: 7 %
Lymphs Abs: 0.7 10*3/uL (ref 0.7–4.0)
MCH: 30 pg (ref 26.0–34.0)
MCHC: 34.1 g/dL (ref 30.0–36.0)
MCV: 87.9 fL (ref 80.0–100.0)
Monocytes Absolute: 0.9 10*3/uL (ref 0.1–1.0)
Monocytes Relative: 9 %
Neutro Abs: 7.4 10*3/uL (ref 1.7–7.7)
Neutrophils Relative %: 78 %
Platelet Count: 195 10*3/uL (ref 150–400)
RBC: 5.2 MIL/uL (ref 4.22–5.81)
RDW: 12.8 % (ref 11.5–15.5)
WBC Count: 9.3 10*3/uL (ref 4.0–10.5)
nRBC: 0 % (ref 0.0–0.2)

## 2023-03-27 MED ORDER — OXYCODONE HCL ER 10 MG PO T12A: 10.0000 mg | EXTENDED_RELEASE_TABLET | Freq: Two times a day (BID) | ORAL | 0 refills | Status: DC

## 2023-03-27 NOTE — Telephone Encounter (Signed)
Smc apt provided

## 2023-03-27 NOTE — Progress Notes (Signed)
Symptom Management Clinic  [facility] Department of the [place]. [facility] [address] [place], [place] [address] [phone] (phone) [phone] (fax)  Patient Care Team: Marisue Ivan, MD as PCP - General (Family Medicine)   Name of the patient: Peter Stuart  [id number]  [birth year]   Date of visit: 03/27/23  Diagnosis- Kidney Cancer metastatic to bone  Chief complaint/ Reason for visit- Radiculopathy  Heme/Onc history:  Oncology History  Cancer of kidney, left (HCC)  2009 Initial Diagnosis   History of prostate cancer treated with robotic prostatectomy initial   04/27/2021 Imaging   CT abdomen pelvis showed acute appendicitis without evidence of perforation or abscess.  Large left renal mass consistent with renal malignancy with renal vein invasion.  Focal annular thickening of descending colon.  05/01/2021 CT chest with contrast showed large sliding-type hiatal hernia.  Mild bilateral posterior bibasilar subsegmental atelectasis   06/25/2021 Initial Diagnosis   Cancer of kidney, left  06/25/2021, patient underwent radical nephrectomy by Dr. Richardo Hanks. Pathology showed clear-cell renal cell carcinoma, nuclear grade 2.  Benign adrenal tissue.  Lymphovascular invasion present. pT3a pNx    06/25/2021 Cancer Staging   Staging form: Kidney, AJCC 8th Edition - Clinical stage from 06/25/2021: Stage III (cT3a, cNX, cM0) - Signed by Rickard Patience, MD on 08/06/2021 Stage prefix: Initial diagnosis   05/29/2022 Imaging   CT chest abdomen pelvis with contrast showed -1. New lytic lesions in the L2 vertebral body and intertrochanteric left femur are most consistent with osseous metastatic disease. 2. Prior left nephrectomy with unchanged size of the soft tissue and fluid collection in the nephrectomy bed favored to reflect postsurgical change given stability. Continued attention on follow-up imaging suggested. 3. Small  lucent focus in the T11 vertebral body is nonspecific but stable from prior imaging suggestive of a benign etiology, attention on follow-up imaging suggested. 4. Stable small bilateral pulmonary nodules, continued attention on follow-up imaging suggested. 5. Large hiatal hernia/intrathoracic stomach also containing a portion of the pancreatic body. 6. Prior prostatectomy without suspicious enhancing nodularity in the prostatectomy bed.   07/15/2022 Procedure   L2 vertebral body CT guided biopsy showed positive for malignancy, metastatic carcinoma, morphologically compatible with known clear cell renal cell carcinoma.    07/19/2022 -  Chemotherapy   RENAL CELL Pembrolizumab (200)      10/30/2022 - 11/18/2022 Radiation Therapy   Status post SBRT to both L2 as well as left femur.   11/26/2022 Surgery   Status post left intramedullary nail   02/14/2023 Imaging   CT chest abdomen pelvis wo contrast  1. Interval development of areas of ground-glass attenuation and nodularity in the lungs bilaterally. This is nonspecific. In the appropriate clinical setting, this could represent atypical infection, particularly if the patient is immunocompromised. Alternatively, the possibility of metastatic disease with peritumoral hemorrhage is not entirely excluded. Further clinical evaluation is recommended, along with close attention on follow-up studies to ensure the regression of these findings. 2. Previously noted osseous lesions appear very similar to the prior study, as above. No definite new osseous lesions are otherwise noted. 3. No definite signs of metastatic disease noted in the abdomen or pelvis. 4. Cholelithiasis without evidence of acute cholecystitis. 5. Large hiatal hernia with nearly completely intrathoracic stomach, similar to the prior study. 6. Aortic atherosclerosis, in addition to left anterior descending coronary artery disease. Assessment for potential risk factor modification, dietary  therapy or pharmacologic therapy may be warranted, if  clinically indicated. 7. Additional incidental findings, as above.   Metastasis to bone (HCC)  09/23/2022 Initial Diagnosis   Metastasis to bone [facility])     Interval history- Patient is 77 year old male, currently receiving pembrolizumab, who presents to symptom management clinic for acute complaints of radiculopathy. He's had this pain since January, previously had pathologic fracture and underwent surgery and is due to known malignancy. He's undergone 2 nerve blocks which helped pain but now, symptoms have returned. Intensity and character of pain is unchanged. Pain in right lower extremity and radiates from buttocks/hips to knee, sharp. Limits ability to stand or sit for periods of time. Previously had surgical fixation of left hip. Pain improves with changing positions, pain medicine. Nothing takes pain away completely. He's been taking oxycodone 5 mg every 6 hours and takes it routinely including overnight. Pain worsens if he delays or misses a dose. Tolerating well but does have some daytime sleepiness. Pain interferes with his mobility and ADLs. Daughter is concerned about his ability to prepare meals, bathe, do house work. Patient also concerned about his ability to drive. Says his appetite is reduced and he's lost about 40 lbs since his diagnosis. Is receiving home health, PT/OT and ws told to lift weights. Takes laxative with morning coffee. Doesn't feel hungry. Takes gabapentin at night to sleep.   ECOG FS: 2-3  Review of systems- Review of Systems  Constitutional:  Positive for malaise/fatigue and weight loss. Negative for chills and fever.  HENT:  Negative for hearing loss, nosebleeds, sore throat and tinnitus.   Eyes:  Negative for blurred vision and double vision.  Respiratory:  Negative for cough, hemoptysis, shortness of breath and wheezing.   Cardiovascular:  Negative for chest pain, palpitations and leg swelling.   Gastrointestinal:  Positive for constipation. Negative for abdominal pain, blood in stool, diarrhea, melena, nausea and vomiting.  Genitourinary:  Negative for dysuria and urgency.  Musculoskeletal:  Positive for joint pain. Negative for back pain, falls and myalgias.  Skin:  Negative for itching and rash.  Neurological:  Negative for dizziness, tingling, sensory change, loss of consciousness, weakness and headaches.  Endo/Heme/Allergies:  Negative for environmental allergies. Does not bruise/bleed easily.  Psychiatric/Behavioral:  Positive for depression. The patient is not nervous/anxious and does not have insomnia.      Allergies  Allergen Reactions   Robaxin [Methocarbamol]     ? hives   Tramadol Hives    Possible hives    Past Medical History:  Diagnosis Date   Anemia    Cancer (HCC)    Cancer of kidney (HCC)    CKD (chronic kidney disease) stage 3, GFR 30-59 ml/min (HCC)    Complication of anesthesia    slow to wake after 1 surgery   CPAP use counseling    Diabetes mellitus without complication (HCC)    HLD (hyperlipidemia)    HTN (hypertension)    OSA on CPAP    PONV (postoperative nausea and vomiting)    Prostate cancer (HCC)    Seasonal allergies    Skin cancer     Past Surgical History:  Procedure Laterality Date   FEMUR IM NAIL Left 11/26/2022   Procedure: LEFT INTRAMEDULLARY (IM) NAIL FEMORAL;  Surgeon: Lyndle Herrlich, MD;  Location: [facility] ORS;  Service: Orthopedics;  Laterality: Left;   HERNIA REPAIR     IR FLUORO GUIDED NEEDLE PLC ASPIRATION/INJECTION LOC  07/02/2022   IR RADIOLOGIST EVAL & MGMT  02/11/2023   IR RADIOLOGIST EVAL &  MGMT  03/11/2023   LAPAROSCOPIC APPENDECTOMY N/A 04/27/2021   Procedure: APPENDECTOMY LAPAROSCOPIC;  Surgeon: Duanne Guess, MD;  Location: [facility] ORS;  Service: General;  Laterality: N/A;   LAPAROSCOPIC NEPHRECTOMY, HAND ASSISTED Left 06/25/2021   Procedure: HAND ASSISTED LAPAROSCOPIC NEPHRECTOMY;  Surgeon: Sondra Come, MD;   Location: [facility] ORS;  Service: Urology;  Laterality: Left;   prostatectomy     REPAIR KNEE LIGAMENT     SHOULDER ARTHROSCOPY WITH SUBACROMIAL DECOMPRESSION AND OPEN ROTATOR C Right 12/29/2020   Procedure: Right shoulder arthroscopic rotator cuff repair, subacromial decompression, and biceps tenodesis;  Surgeon: Signa Kell, MD;  Location: [facility];  Service: Orthopedics;  Laterality: Right;   TONSILLECTOMY     VASECTOMY      Social History   Socioeconomic History   Marital status: Widowed    Spouse name: Not on file   Number of children: Not on file   Years of education: Not on file   Highest education level: Not on file  Occupational History   Not on file  Tobacco Use   Smoking status: Never    Passive exposure: Never   Smokeless tobacco: Never  Vaping Use   Vaping Use: Never used  Substance and Sexual Activity   Alcohol use: Not Currently   Drug use: Never   Sexual activity: Not Currently  Other Topics Concern   Not on file  Social History Narrative   Not on file   Social Determinants of Health   Financial Resource Strain: Not on file  Food Insecurity: No Food Insecurity (11/27/2022)   Hunger Vital Sign    Worried About Running Out of Food in the Last Year: Never true    Ran Out of Food in the Last Year: Never true  Transportation Needs: No Transportation Needs (11/27/2022)   PRAPARE - Administrator, Civil Service (Medical): No    Lack of Transportation (Non-Medical): No  Physical Activity: Not on file  Stress: Not on file  Social Connections: Not on file  Intimate Partner Violence: Not At Risk (11/27/2022)   Humiliation, Afraid, Rape, and Kick questionnaire    Fear of Current or Ex-Partner: No    Emotionally Abused: No    Physically Abused: No    Sexually Abused: No    Family History  Problem Relation Age of Onset   Stroke Mother    Hypertension Father    Emphysema Father      Current Outpatient Medications:    ALLEGRA ALLERGY 60  MG tablet, Take 60 mg by mouth daily., Disp: , Rfl:    cholecalciferol (VITAMIN D3) 25 MCG (1000 UNIT) tablet, Take 1,000 Units by mouth daily., Disp: , Rfl:    clotrimazole (LOTRIMIN) 1 % cream, Apply topically 2 (two) times daily., Disp: , Rfl:    Coenzyme Q10 10 MG capsule, Take 10 mg by mouth daily., Disp: , Rfl:    diphenhydrAMINE-Zinc Acetate (BENADRYL ITCH RELIEF EX), Apply topically., Disp: , Rfl:    docusate sodium (COLACE) 100 MG capsule, Take 100 mg by mouth 2 (two) times daily., Disp: , Rfl:    ezetimibe (ZETIA) 10 MG tablet, Take 1 tablet by mouth daily., Disp: , Rfl:    fluticasone (FLONASE) 50 MCG/ACT nasal spray, Place 2 sprays into both nostrils daily., Disp: , Rfl:    gabapentin (NEURONTIN) 600 MG tablet, Take 600 mg by mouth at bedtime., Disp: , Rfl:    GINKGO BILOBA COMPLEX PO, Take 1 tablet by mouth daily., Disp: , Rfl:  hydrochlorothiazide (HYDRODIURIL) 25 MG tablet, Take 1 tablet by mouth daily., Disp: , Rfl:    losartan (COZAAR) 100 MG tablet, Take 100 mg by mouth daily., Disp: , Rfl:    Multiple Vitamin (MULTIVITAMIN) capsule, Take 1 capsule by mouth daily., Disp: , Rfl:    multivitamin-lutein (OCUVITE-LUTEIN) CAPS capsule, Take 1 capsule by mouth daily., Disp: , Rfl:    oxyCODONE (OXY IR/ROXICODONE) 5 MG immediate release tablet, Take 1 tablet (5 mg total) by mouth every 6 (six) hours as needed for severe pain., Disp: 120 tablet, Rfl: 0   pantoprazole (PROTONIX) 40 MG tablet, Take 1 tablet (40 mg total) by mouth daily., Disp: 30 tablet, Rfl: 2   Pembrolizumab (KEYTRUDA IV), Inject into the vein., Disp: , Rfl:    polyethylene glycol (MIRALAX / GLYCOLAX) 17 g packet, Take 17 g by mouth daily., Disp: , Rfl:    triamcinolone cream (KENALOG) 0.1 %, Apply 1 Application topically 2 (two) times daily., Disp: , Rfl:    dronabinol (MARINOL) 5 MG capsule, Take 1 capsule (5 mg total) by mouth 2 (two) times daily before a meal. (Patient not taking: Reported on 03/12/2023), Disp: 60  capsule, Rfl: 0  Physical exam:  Vitals:   03/27/23 1043  BP: 111/82  Pulse: 73  Temp: 98.7 F (37.1 C)  TempSrc: Tympanic  SpO2: 100%   Physical Exam Vitals reviewed.  Constitutional:      General: He is not in acute distress.    Appearance: He is well-developed.  HENT:     Head: Normocephalic and atraumatic.     Mouth/Throat:     Pharynx: No oropharyngeal exudate.  Cardiovascular:     Rate and Rhythm: Normal rate and regular rhythm.  Pulmonary:     Effort: Pulmonary effort is normal. No respiratory distress.  Abdominal:     General: There is no distension.     Palpations: Abdomen is soft.  Musculoskeletal:     Comments: Offloading right hip/back in wheelchair.   Skin:    General: Skin is warm and dry.     Coloration: Skin is not pale.  Neurological:     Mental Status: He is alert and oriented to person, place, and time.  Psychiatric:        Mood and Affect: Mood normal.        Behavior: Behavior normal.         Latest Ref Rng & Units 03/27/2023   10:32 AM  CMP  Glucose 70 - 99 mg/dL 528   BUN 8 - 23 mg/dL 35   Creatinine 4.13 - 1.24 mg/dL 2.44   Sodium 010 - 272 mmol/L 130   Potassium 3.5 - 5.1 mmol/L 3.7   Chloride 98 - 111 mmol/L 93   CO2 22 - 32 mmol/L 23   Calcium 8.9 - 10.3 mg/dL 9.2   Total Protein 6.5 - 8.1 g/dL 7.1   Total Bilirubin 0.3 - 1.2 mg/dL 0.8   Alkaline Phos 38 - 126 U/L 51   AST 15 - 41 U/L 22   ALT 0 - 44 U/L 18       Latest Ref Rng & Units 03/27/2023   10:32 AM  CBC  WBC 4.0 - 10.5 K/uL 9.3   Hemoglobin 13.0 - 17.0 g/dL 53.6   Hematocrit 64.4 - 52.0 % 45.7   Platelets 150 - 400 K/uL 195     No images are attached to the encounter.  DG NERVE ROOT BLOCK LUMBAR SACRAL  Result Date: 03/11/2023 CLINICAL  DATA:  Right lower extremity radiculopathy in the setting of osseous metastatic disease with nerve root compression; previous right L2-L3 nerve root block with moderate improvement in symptoms that lasted for 2 weeks, now  presents for second injection at L3-L4 based on review of imaging FLUOROSCOPY: Radiation Exposure Index (as provided by the fluoroscopic device): 1.0 minutes (6 mGy) PROCEDURE: The procedure, risks, benefits, and alternatives were explained to the patient. Questions regarding the procedure were encouraged and answered. The patient understands and consents to the procedure. LUMBAR EPIDURAL INJECTION: A transforaminal approach was performed on right at L3-L4. The overlying skin was cleansed and anesthetized. A 3 22 gauge spinal needle was advanced towards the identified neural foramen using anatomic landmarks. DIAGNOSTIC EPIDURAL INJECTION: Injection of Isovue-M 200 shows a good epidural pattern with spread along the nerve root sleeve. No vascular opacification is seen. THERAPEUTIC EPIDURAL INJECTION: 80 mg of Depo-Medrol mixed with 1 mL 1% lidocaine were instilled. The procedure was well-tolerated, and the patient was discharged thirty minutes following the injection in good condition. COMPLICATIONS: None immediate IMPRESSION: Successful epidural steroid injection/selective nerve root block of the right L3-L4 nerve root via a transforaminal approach. Electronically Signed   By: Olive Bass M.D.   On: 03/11/2023 16:41   IR Radiologist Eval & Mgmt  Result Date: 03/11/2023 EXAM: ESTABLISHED PATIENT OFFICE VISIT CHIEF COMPLAINT: Documented in the EMR HISTORY OF PRESENT ILLNESS: Patient follows up with me today for right lower extremity radiculopathy caused by known metastatic renal cancer involving the L2 vertebral body with extension into the neural foramina. Patient underwent subsequent right L2-L3 nerve root block on February 12, 2023. Patient reported moderate improvement in symptoms, with his right lower extremity pain improving from 6/10 to 4/10. His relief lasted approximately 2 weeks. His pain has now returned close to his baseline, at around 6/10. His pain is worse in the mornings with mild improvement with  oxycodone. His pain is better in the afternoons in the evenings. He does have some pain in the lower back that is intermittent, but he reports that his more significant pain is his radiculopathy towards his right hip and right knee. It is particularly bothersome in the mornings when he is walking or standing to prepare food in the kitchen. Given his moderate improvement in symptoms following his first nerve root block, will plan to perform a second nerve root block at an adjacent level given coexisting involvement at L3-L4 on the right side. REVIEW OF SYSTEMS: Documented in the EMR PHYSICAL EXAMINATION: Documented in the EMR ASSESSMENT AND PLAN: Documented in the EMR Electronically Signed   By: Olive Bass M.D.   On: 03/11/2023 09:55    Assessment and plan- Patient is a 77 y.o. male   Leg and back pain- d/p known radiculopathy in setting of osseous metastatic disease with nerve root compression. Previously underwent right L2-L3 nerve root block with moderate improvement in symptoms x 2 weeks. Now s/p L3-L4 nerve root block on 03/11/23 with moderate improvement. However, today, pain has returned. Spoke to Dr Juliette Alcide to discuss options- osteocool vs repeat block. Referral sent for re-evaluation.  Inappetence - refer to dietician. Likely related to malignancy, changes in mobility. Declined trial of marinol (Dr Cathie Hoops). Could reconsider for adjunct pain. Patient reluctant. Could trial mirtazapine or others. Defer to palliative care.  Osseous metastases - symptomatic. On xgeva.  Pain related to malignancy- Currently on oxycodone 5 mg q6h. Taking scheduled. Recommend adding long acting pain medication. Start oxycontin 10 mg q12 h and  he will continue oxycodone for breakthrough pain. Could also consider adding duloxetine.  Constipation- related to narcotics, intake and mobility changes. Continue bowel prophylaxis including senna and miralax, uptitrated to effect. Milk of magnesia or mag citrate if no BM in 48 hours.   Palliative care- patient will benefit from ongoing symptom management. I've updated Josh Borders on his presentation today and will have patient follow up with Josh in 2 weeks for ongoing care.   Disposition:  Refer to [employer] Oxycontin  F/u with Josh Borders in 2 weeks for telephone visit   Visit Diagnosis 1. Pain from bone metastases (HCC)   2. Cancer associated pain   3. Unintentional weight loss   4. Constipation due to opioid therapy    Patient expressed understanding and was in agreement with this plan. He also understands that He can call clinic at any time with any questions, concerns, or complaints.   Thank you for allowing me to participate in the care of this very pleasant patient.   Consuello Masse, DNP, AGNP-C, AOCNP [facility] [phone]  CC: Dr Cathie Hoops

## 2023-03-27 NOTE — Telephone Encounter (Signed)
RN followed up on [employer] sent by daughter, Tresa Endo.  Rn called Tresa Endo to discuss pt's concerns. Pt experiencing excruciating leg/back pain not relieved by oxycodone. Pt has no appetite/not eating/drinking. Also c/o "feeling flush." Denies any fevers.   Smc visit added today. Lab at 1030- smc-Lauren and infusion/fluid encounter. Pt's daughter, Angelica Chessman will be bringing pt to this apt.

## 2023-04-01 ENCOUNTER — Other Ambulatory Visit: Payer: Self-pay | Admitting: Nurse Practitioner

## 2023-04-01 ENCOUNTER — Encounter: Payer: Self-pay | Admitting: Urology

## 2023-04-01 ENCOUNTER — Telehealth: Payer: Self-pay | Admitting: *Deleted

## 2023-04-01 ENCOUNTER — Ambulatory Visit (INDEPENDENT_AMBULATORY_CARE_PROVIDER_SITE_OTHER): Payer: Medicare Other | Admitting: Urology

## 2023-04-01 VITALS — BP 101/65 | HR 97 | Ht 69.0 in

## 2023-04-01 DIAGNOSIS — Z8546 Personal history of malignant neoplasm of prostate: Secondary | ICD-10-CM | POA: Diagnosis not present

## 2023-04-01 DIAGNOSIS — C7951 Secondary malignant neoplasm of bone: Secondary | ICD-10-CM | POA: Diagnosis not present

## 2023-04-01 DIAGNOSIS — C642 Malignant neoplasm of left kidney, except renal pelvis: Secondary | ICD-10-CM

## 2023-04-01 DIAGNOSIS — C61 Malignant neoplasm of prostate: Secondary | ICD-10-CM

## 2023-04-01 DIAGNOSIS — C649 Malignant neoplasm of unspecified kidney, except renal pelvis: Secondary | ICD-10-CM

## 2023-04-01 MED ORDER — XTAMPZA ER 9 MG PO C12A: 1.0000 | EXTENDED_RELEASE_CAPSULE | Freq: Two times a day (BID) | ORAL | 0 refills | Status: AC

## 2023-04-01 NOTE — Telephone Encounter (Signed)
Per Leotis Shames, NP- patient has not been able to get his oxycodone 10 mg ER/12 hr. I have not received any request from pharmacy for a PA. I attempted to reach the pharmacy, but was forced to leave a vm on pharmacy line. I submited a PA via cover mymeds. insurance preference is FENTANYL, MORPHINE SULFATE ER TBCR, or XTAMPZA ER.   [name] ([employer] Meres) - [id number] oxyCODONE HCl ER 10MG  er tablets

## 2023-04-01 NOTE — Progress Notes (Signed)
   04/01/2023 9:47 AM   [name] 11-Oct-1946 [id number]  Reason for visit: Follow up metastatic RCC, history of prostate cancer, CKD  HPI: 77 year old male with history of prostate cancer treated with robotic prostatectomy in [place] in 2009 and undetectable PSA since that time who was found to have an 8 cm central left renal mass and underwent an uncomplicated left hand-assisted laparoscopic radical nephrectomy on 06/25/2021, with pathology showing renal cell carcinoma, Fuhrman grade 2, stage pT3a with negative margins.  He had biopsy-proven recurrence of RCC in the L2 vertebral body and left femur in August 2023.  PSA remains undetectable.  I reviewed the outside oncology and radiation oncology notes extensively.  He has undergone SBRT to L2 as well as the left femur with radiation, as well as been started on pembrolizumab through oncology.  He also underwent left intramedullary nail placement in January 2024.  I personally viewed and interpreted the most recent CT chest abdomen pelvis from April 2024 that shows relatively stable osseous lesions consistent with metastatic disease, but no definite evidence of new metastatic disease or progression.  His pain medications are managed by oncology and currently taking oxycodone 5 mg every 6 hours for severe pain, with plan to transition to OxyContin in the near future.  He also takes gabapentin nightly.  Pain not well-controlled, and his primary issue is radiating pain down the right leg as well as low back pain.  I reached out to his oncology team, as he has an appointment later this week to try to alter his medication regimen and improve his back and leg pain.  Renal function stable with creatinine 1.42, EGFR 51 which has been stable over the last 6 months.  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

## 2023-04-02 ENCOUNTER — Other Ambulatory Visit: Payer: Self-pay | Admitting: Oncology

## 2023-04-02 ENCOUNTER — Ambulatory Visit
Admission: RE | Admit: 2023-04-02 | Discharge: 2023-04-02 | Disposition: A | Discharge status: Home / Self Care | Payer: Medicare Other | Source: Ambulatory Visit | Attending: Nurse Practitioner | Admitting: Nurse Practitioner

## 2023-04-02 ENCOUNTER — Inpatient Hospital Stay: Payer: Medicare Other | Attending: Oncology

## 2023-04-02 ENCOUNTER — Inpatient Hospital Stay: Payer: Medicare Other

## 2023-04-02 ENCOUNTER — Other Ambulatory Visit: Payer: Self-pay

## 2023-04-02 ENCOUNTER — Inpatient Hospital Stay (HOSPITAL_BASED_OUTPATIENT_CLINIC_OR_DEPARTMENT_OTHER): Payer: Medicare Other | Admitting: Oncology

## 2023-04-02 ENCOUNTER — Encounter: Payer: Self-pay | Admitting: Oncology

## 2023-04-02 VITALS — BP 113/76 | HR 84 | Temp 97.8°F | Resp 18 | Wt 205.0 lb

## 2023-04-02 VITALS — BP 110/74 | HR 67

## 2023-04-02 DIAGNOSIS — C61 Malignant neoplasm of prostate: Secondary | ICD-10-CM | POA: Diagnosis not present

## 2023-04-02 DIAGNOSIS — Z79899 Other long term (current) drug therapy: Secondary | ICD-10-CM | POA: Insufficient documentation

## 2023-04-02 DIAGNOSIS — R634 Abnormal weight loss: Secondary | ICD-10-CM

## 2023-04-02 DIAGNOSIS — N1832 Chronic kidney disease, stage 3b: Secondary | ICD-10-CM

## 2023-04-02 DIAGNOSIS — Z5112 Encounter for antineoplastic immunotherapy: Secondary | ICD-10-CM

## 2023-04-02 DIAGNOSIS — C7951 Secondary malignant neoplasm of bone: Secondary | ICD-10-CM

## 2023-04-02 DIAGNOSIS — C642 Malignant neoplasm of left kidney, except renal pelvis: Secondary | ICD-10-CM

## 2023-04-02 DIAGNOSIS — G893 Neoplasm related pain (acute) (chronic): Secondary | ICD-10-CM

## 2023-04-02 HISTORY — PX: IR RADIOLOGIST EVAL & MGMT: IMG5224

## 2023-04-02 LAB — CBC WITH DIFFERENTIAL/PLATELET
Abs Immature Granulocytes: 0.05 10*3/uL (ref 0.00–0.07)
Basophils Absolute: 0.1 10*3/uL (ref 0.0–0.1)
Basophils Relative: 1 %
Eosinophils Absolute: 0.5 10*3/uL (ref 0.0–0.5)
Eosinophils Relative: 5 %
HCT: 42.1 % (ref 39.0–52.0)
Hemoglobin: 14.5 g/dL (ref 13.0–17.0)
Immature Granulocytes: 1 %
Lymphocytes Relative: 7 %
Lymphs Abs: 0.7 10*3/uL (ref 0.7–4.0)
MCH: 30.1 pg (ref 26.0–34.0)
MCHC: 34.4 g/dL (ref 30.0–36.0)
MCV: 87.3 fL (ref 80.0–100.0)
Monocytes Absolute: 0.8 10*3/uL (ref 0.1–1.0)
Monocytes Relative: 8 %
Neutro Abs: 7.9 10*3/uL — ABNORMAL HIGH (ref 1.7–7.7)
Neutrophils Relative %: 78 %
Platelets: 188 10*3/uL (ref 150–400)
RBC: 4.82 MIL/uL (ref 4.22–5.81)
RDW: 12.8 % (ref 11.5–15.5)
WBC: 10 10*3/uL (ref 4.0–10.5)
nRBC: 0 % (ref 0.0–0.2)

## 2023-04-02 LAB — COMPREHENSIVE METABOLIC PANEL
ALT: 15 U/L (ref 0–44)
AST: 15 U/L (ref 15–41)
Albumin: 3.5 g/dL (ref 3.5–5.0)
Alkaline Phosphatase: 43 U/L (ref 38–126)
Anion gap: 13 (ref 5–15)
BUN: 34 mg/dL — ABNORMAL HIGH (ref 8–23)
CO2: 24 mmol/L (ref 22–32)
Calcium: 8.5 mg/dL — ABNORMAL LOW (ref 8.9–10.3)
Chloride: 96 mmol/L — ABNORMAL LOW (ref 98–111)
Creatinine, Ser: 1.28 mg/dL — ABNORMAL HIGH (ref 0.61–1.24)
GFR, Estimated: 57 mL/min — ABNORMAL LOW (ref >60)
Glucose, Bld: 192 mg/dL — ABNORMAL HIGH (ref 70–99)
Potassium: 3.3 mmol/L — ABNORMAL LOW (ref 3.5–5.1)
Sodium: 133 mmol/L — ABNORMAL LOW (ref 135–145)
Total Bilirubin: 0.8 mg/dL (ref 0.3–1.2)
Total Protein: 6.3 g/dL — ABNORMAL LOW (ref 6.5–8.1)

## 2023-04-02 MED ORDER — SODIUM CHLORIDE 0.9 % IV SOLN
Freq: Once | INTRAVENOUS | Status: AC
  Filled 2023-04-02: qty 250

## 2023-04-02 MED ORDER — SODIUM CHLORIDE 0.9 % IV SOLN
200.0000 mg | Freq: Once | INTRAVENOUS | Status: AC
  Administered 2023-04-02: 200 mg via INTRAVENOUS
  Filled 2023-04-02: qty 200

## 2023-04-02 MED ORDER — OXYCODONE HCL 5 MG PO TABS: 5.0000 mg | ORAL_TABLET | Freq: Four times a day (QID) | ORAL | 0 refills | Status: DC | PRN

## 2023-04-02 MED ORDER — MEGESTROL ACETATE 40 MG PO TABS: 40.0000 mg | ORAL_TABLET | Freq: Every day | ORAL | 0 refills | Status: DC

## 2023-04-02 NOTE — Assessment & Plan Note (Addendum)
Follow up with urology

## 2023-04-02 NOTE — Assessment & Plan Note (Signed)
Recommend patient to resume calcium supplementation, half tablet daily.

## 2023-04-02 NOTE — Assessment & Plan Note (Signed)
Follow up with nutritionist  Recommend healthy diet with high protein.  BMI 30   

## 2023-04-02 NOTE — Assessment & Plan Note (Addendum)
oxycodone 5mg  Q6h PRN  Oxycodone ER pending insurance approval.  Back pain and  hip pain, improved with previous nerve block, however effectiveness is short lived.  Some of pain is due to radiculopathy.  Discussed with IR Dr. Juliette Alcide, who will see him again for evaluation of OsteoCool.

## 2023-04-02 NOTE — Assessment & Plan Note (Signed)
Encourage oral hydration, avoid nephrotoxins 

## 2023-04-02 NOTE — Patient Instructions (Signed)
[facility] [place] [facility]  Discharge Instructions: Thank you for choosing [facility] to provide your oncology and hematology care.  If you have a lab appointment with the Cancer Center, please go directly to the Cancer Center and check in at the registration area.  Wear comfortable clothing and clothing appropriate for easy access to any Portacath or PICC line.   We strive to give you quality time with your provider. You may need to reschedule your appointment if you arrive late (15 or more minutes).  Arriving late affects you and other patients whose appointments are after yours.  Also, if you miss three or more appointments without notifying the office, you may be dismissed from the clinic at the provider's discretion.      For prescription refill requests, have your pharmacy contact our office and allow 72 hours for refills to be completed.    Today you received the following chemotherapy and/or immunotherapy agents- Keytruda      To help prevent nausea and vomiting after your treatment, we encourage you to take your nausea medication as directed.  BELOW ARE SYMPTOMS THAT SHOULD BE REPORTED IMMEDIATELY: *FEVER GREATER THAN 100.4 F (38 C) OR HIGHER *CHILLS OR SWEATING *NAUSEA AND VOMITING THAT IS NOT CONTROLLED WITH YOUR NAUSEA MEDICATION *UNUSUAL SHORTNESS OF BREATH *UNUSUAL BRUISING OR BLEEDING *URINARY PROBLEMS (pain or burning when urinating, or frequent urination) *BOWEL PROBLEMS (unusual diarrhea, constipation, pain near the anus) TENDERNESS IN MOUTH AND THROAT WITH OR WITHOUT PRESENCE OF ULCERS (sore throat, sores in mouth, or a toothache) UNUSUAL RASH, SWELLING OR PAIN  UNUSUAL VAGINAL DISCHARGE OR ITCHING   Items with * indicate a potential emergency and should be followed up as soon as possible or go to the Emergency Department if any problems should occur.  Please show the CHEMOTHERAPY ALERT CARD or IMMUNOTHERAPY ALERT CARD at check-in to  the Emergency Department and triage nurse.  Should you have questions after your visit or need to cancel or reschedule your appointment, please contact [facility] [place] [facility]  [phone] and follow the prompts.  Office hours are 8:00 a.m. to 4:30 p.m. Monday - Friday. Please note that voicemails left after 4:00 p.m. may not be returned until the following business day.  We are closed weekends and major holidays. You have access to a nurse at all times for urgent questions. Please call the main number to the clinic [phone] and follow the prompts.  For any non-urgent questions, you may also contact your provider using MyChart. We now offer e-Visits for anyone 18 and older to request care online for non-urgent symptoms. For details visit [email or web address].   Also download the MyChart app! Go to the app store, search "MyChart", open the app, select [facility], and log in with your MyChart username and password.   

## 2023-04-02 NOTE — Assessment & Plan Note (Signed)
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 Biopsy of L2 + metastatic carcinoma, stage IV, limited disease burden.  Currently on palliative immunotherapy So far no visceral metastatic lesions, however, bone lesions are progressing. -  Continue immunotherapy with local therapies of bone lesion.  Labs are reviewed and discussed with patient. Proceed with Keytruda.    

## 2023-04-02 NOTE — Assessment & Plan Note (Signed)
S/p  SBRT to L2 and left femur.  Follow up with orthopedic surgeon - s/p Left IM nail femur surgery.  right ischium lesion-- discussed with Dr. Juliette Alcide, he does not think his symptoms  are directly from bone pain related to his metastatic disease, and therefore RFA is less likely to be helpful.  Right L2-L3 neuroforamen, s/p Right L2/L3 Nerve Root Block by IR 03/11/23 S/p right L3-L4 nerve root block by IR Pending IR evaluation for osteocool.  Continue Xgeva, Q6 weeks.

## 2023-04-02 NOTE — Assessment & Plan Note (Signed)
Treatment plan listed above.  Monitor immunotherapy side effects 

## 2023-04-02 NOTE — Progress Notes (Signed)
Interventional Radiology - Clinic Visit, Follow-up Note    History of Present Illness  Peter Stuart is a 77 y.o. male with a history of metastatic RCC with osseous lesion involving the L2 vertebral body resulting in RLE radiculopathy, seen in follow up for further management.   When I initially saw the patient on February 11, 2023, the patient had described a right lower extremity radiculopathy, with less discrete back pain symptoms.  Given these findings, the patient then underwent 2 selective nerve root blocks/steroid injections of the right L2-L3 and right L3-L4 nerve roots using fluoroscopic guidance.  The patient did report moderate improvement of his baseline pain in his right lower extremity radiculopathy, normally 7/10, to 4/10.  However, the relief was short-lived, lasting only 2-3 weeks after each injection.    Review of the patient's MRI lumbar spine from January 15, 2023, confirmed the presence of a predominantly lytic lesion involving the right half of the L2 vertebral body, with resultant at least moderate compression of the right L2-L3 neural foramen.  These findings are compatible with RCC osseous metastatic disease, resulting in a right lower extremity radiculopathy.   The patient continues to take oxycodone every 6 hours.  His pain remains 6-7/10 using the oxycodone.  He does complain of significant constipation while using narcotic pain medication.   The patient report significant disability on the [employer] Disability Questionnaire, with 23/24 positive.     Past medical and surgical history reviewed. No interval changes. No interval hospitalizations.   Medications  I have reviewed the current medication list. Refer to chart for details. Current Outpatient Medications  Medication Instructions   Allegra Allergy 60 mg, Oral, Daily   cholecalciferol (VITAMIN D3) 1,000 Units, Oral, Daily   clotrimazole (LOTRIMIN) 1 % cream Topical, 2 times daily   Coenzyme Q10 10 mg, Oral,  Daily   diphenhydrAMINE-Zinc Acetate (BENADRYL ITCH RELIEF EX) Apply externally   ezetimibe (ZETIA) 10 MG tablet 1 tablet, Oral, Daily   fluticasone (FLONASE) 50 MCG/ACT nasal spray 2 sprays, Each Nare, Daily   gabapentin (NEURONTIN) 600 mg, Oral, Daily at bedtime   GINKGO BILOBA COMPLEX PO 1 tablet, Oral, Daily   hydrochlorothiazide (HYDRODIURIL) 25 MG tablet 1 tablet, Oral, Daily   losartan (COZAAR) 100 mg, Oral, Daily   megestrol (MEGACE) 40 mg, Oral, Daily   Multiple Vitamin (MULTIVITAMIN) capsule 1 capsule, Oral, Daily   multivitamin-lutein (OCUVITE-LUTEIN) CAPS capsule 1 capsule, Oral, Daily   oxyCODONE (OXY IR/ROXICODONE) 5 mg, Oral, Every 6 hours PRN   oxyCODONE ER (XTAMPZA ER) 9 MG C12A 1 capsule, Oral, Every 12 hours, For long acting pain control d/t malignancy   pantoprazole (PROTONIX) 40 mg, Oral, Daily   Pembrolizumab (KEYTRUDA IV) Intravenous   polyethylene glycol (MIRALAX / GLYCOLAX) 17 g, Oral, Daily   PREVIDENT 5000 BOOSTER PLUS 1.1 % PSTE Oral   triamcinolone cream (KENALOG) 0.1 % 1 Application, Topical, 2 times daily      Physical Exam Current Vitals Temp: 99.3 F (37.4 C) (Temp Source: Oral)  Pulse Rate: 88  Resp: 14  BP: 115/71  SpO2: 96 %     Weight: 88.5 kg  Body mass index is 28.8 kg/m.  General: Alert and answers questions appropriately. In Wheelchair.  HEENT: Normocephalic, atraumatic.  Cardiac: Regular rate. No dependent edema. Pulmonary: Normal work of breathing. On room air. Abdominal: Soft without distension.    Pertinent Lab Results    Latest Ref Rng & Units 04/02/2023    8:15 AM 03/27/2023   10:32 AM  03/12/2023    8:20 AM  CBC  WBC 4.0 - 10.5 K/uL 10.0  9.3  7.4   Hemoglobin 13.0 - 17.0 g/dL 29.5  62.1  30.8   Hematocrit 39.0 - 52.0 % 42.1  45.7  41.6   Platelets 150 - 400 K/uL 188  195  194       Latest Ref Rng & Units 04/02/2023    8:15 AM 03/27/2023   10:32 AM 03/12/2023    8:20 AM  CMP  Glucose 70 - 99 mg/dL 657  846  962   BUN  8 - 23 mg/dL 34  35  35   Creatinine 0.61 - 1.24 mg/dL 9.52  8.41  3.24   Sodium 135 - 145 mmol/L 133  130  131   Potassium 3.5 - 5.1 mmol/L 3.3  3.7  3.6   Chloride 98 - 111 mmol/L 96  93  94   CO2 22 - 32 mmol/L 24  23  25    Calcium 8.9 - 10.3 mg/dL 8.5  9.2  40.1   Total Protein 6.5 - 8.1 g/dL 6.3  7.1  6.5   Total Bilirubin 0.3 - 1.2 mg/dL 0.8  0.8  0.9   Alkaline Phos 38 - 126 U/L 43  51  49   AST 15 - 41 U/L 15  22  19    ALT 0 - 44 U/L 15  18  26       Relevant and/or Recent Imaging: Detailed in the HPI    Assessment & Plan:   Given the patient's temporary relief with selective nerve root blocks, I do believe that compression of the nerve root by residual tumor at the L2 vertebral body is the most likely culprit of the patient's pain.  However, steroid injections have provided only short-term relief.  An alternative treatment options could include radiofrequency ablation of the tumor, in hopes of reducing tumor burden and relieving the compression on the L2-L3 neural foramen.  The risks and benefits of radiofrequency ablation and kyphoplasty (OsteoCool) was discussed with the patient, who expressed interest in this treatment strategy.    History and exam have demonstrated the following:  Metastatic involvement of the L2 vertebral body by imaging dated 01/15/2023, Failure of conservative therapy and pain refractory to narcotic pain mediation, Inability to tolerate narcotic pain medication due to side effects, and Significant disability on the [employer] Disability Questionnaire with 23/24 positive symptoms, reflecting significant impact/impairment of (ADLs)    Plan:  L2 vertebral body OsteoCool (RFA + augmentation with balloon kyphoplasty)  Post-procedure disposition: outpatient DRI-A  Medication holds: TBD   Advanced Care Plan: The patient did not want to provide an Advanced Care Plan at the time of this visit     Total time spent on today's visit was over 40 Minutes,  including both face-to-face time and non face-to-face time, personally spent on review of chart (including labs and relevant imaging), discussing further workup and treatment options, referral to specialist if needed, reviewing outside records if pertinent, answering patient questions, and coordinating care regarding L2 metastatic disease and RLE radiculopathy as well as management strategy.      Olive Bass, MD  Vascular and Interventional Radiology 04/02/2023 3:40 PM

## 2023-04-02 NOTE — Progress Notes (Signed)
Hematology/Oncology Progress note [phone] [phone]   CHIEF COMPLAINTS/REASON FOR VISIT:  RCC  ASSESSMENT & PLAN:   Cancer Staging  Cancer of kidney, left [facility]) Staging form: Kidney, AJCC 8th Edition - Clinical stage from 06/25/2021: Stage III (cT3a, cNX, cM0) - Signed by Rickard Patience, MD on 08/06/2021   Cancer of kidney, left [facility]) History of stage III left RCC, Lytic bone lesion of L2 and intertrochanteric left femur, consistent with osseous metastasis. Biopsy of L2 + metastatic carcinoma, stage IV, limited disease burden.  Currently on palliative immunotherapy So far no visceral metastatic lesions, however, bone lesions are progressing. -  Continue immunotherapy with local therapies of bone lesion.  Labs are reviewed and discussed with patient. Proceed with Keytruda.     Prostate cancer (HCC) Follow up with urology  CKD (chronic kidney disease) stage 3, GFR 30-59 ml/min (HCC) Encourage oral hydration, avoid nephrotoxins  Encounter for antineoplastic immunotherapy Treatment plan listed above.  Monitor immunotherapy side effects  Metastasis to bone [facility]) S/p  SBRT to L2 and left femur.  Follow up with orthopedic surgeon - s/p Left IM nail femur surgery.  right ischium lesion-- discussed with Dr. Juliette Alcide, he does not think his symptoms  are directly from bone pain related to his metastatic disease, and therefore RFA is less likely to be helpful.  Right L2-L3 neuroforamen, s/p Right L2/L3 Nerve Root Block by IR 03/11/23 S/p right L3-L4 nerve root block by IR Pending IR evaluation for osteocool.  Continue Xgeva, Q6 weeks.   Neoplasm related pain oxycodone 5mg  Q6h PRN  Oxycodone ER pending insurance approval.  Back pain and  hip pain, improved with previous nerve block, however effectiveness is short lived.  Some of pain is due to radiculopathy.  Discussed with IR Dr. Juliette Alcide, who will see him again for evaluation of OsteoCool.     Weight  loss Follow up with nutritionist  Recommend healthy diet with high protein.  BMI 30    Hypocalcemia Recommend patient to resume calcium supplementation, half tablet daily.    No orders of the defined types were placed in this encounter.  Follow up 3 weeks lab MD [facility] All questions were answered. The patient knows to call the clinic with any problems, questions or concerns.  Rickard Patience, MD, PhD [facility] Hematology Oncology 04/02/2023    HISTORY OF PRESENTING ILLNESS:   Peter Stuart is a  77 y.o.  male presents for follow-up of history of left RCC and history of prostate cancer. Oncology history summary listed as below. Oncology History  Cancer of kidney, left (HCC)  2009 Initial Diagnosis   History of prostate cancer treated with robotic prostatectomy initial   04/27/2021 Imaging   CT abdomen pelvis showed acute appendicitis without evidence of perforation or abscess.  Large left renal mass consistent with renal malignancy with renal vein invasion.  Focal annular thickening of descending colon.  05/01/2021 CT chest with contrast showed large sliding-type hiatal hernia.  Mild bilateral posterior bibasilar subsegmental atelectasis   06/25/2021 Initial Diagnosis   Cancer of kidney, left  06/25/2021, patient underwent radical nephrectomy by Dr. Richardo Hanks. Pathology showed clear-cell renal cell carcinoma, nuclear grade 2.  Benign adrenal tissue.  Lymphovascular invasion present. pT3a pNx    06/25/2021 Cancer Staging   Staging form: Kidney, AJCC 8th Edition - Clinical stage from 06/25/2021: Stage III (cT3a, cNX, cM0) - Signed by Rickard Patience, MD on 08/06/2021 Stage prefix: Initial diagnosis   05/29/2022 Imaging   CT chest abdomen pelvis with contrast  showed -1. New lytic lesions in the L2 vertebral body and intertrochanteric left femur are most consistent with osseous metastatic disease. 2. Prior left nephrectomy with unchanged size of the soft tissue and fluid collection in the  nephrectomy bed favored to reflect postsurgical change given stability. Continued attention on follow-up imaging suggested. 3. Small lucent focus in the T11 vertebral body is nonspecific but stable from prior imaging suggestive of a benign etiology, attention on follow-up imaging suggested. 4. Stable small bilateral pulmonary nodules, continued attention on follow-up imaging suggested. 5. Large hiatal hernia/intrathoracic stomach also containing a portion of the pancreatic body. 6. Prior prostatectomy without suspicious enhancing nodularity in the prostatectomy bed.   07/15/2022 Procedure   L2 vertebral body CT guided biopsy showed positive for malignancy, metastatic carcinoma, morphologically compatible with known clear cell renal cell carcinoma.    07/19/2022 -  Chemotherapy   RENAL CELL Pembrolizumab (200)      10/30/2022 - 11/18/2022 Radiation Therapy   Status post SBRT to both L2 as well as left femur.   11/26/2022 Surgery   Status post left intramedullary nail   02/14/2023 Imaging   CT chest abdomen pelvis wo contrast  1. Interval development of areas of ground-glass attenuation and nodularity in the lungs bilaterally. This is nonspecific. In the appropriate clinical setting, this could represent atypical infection, particularly if the patient is immunocompromised. Alternatively, the possibility of metastatic disease with peritumoral hemorrhage is not entirely excluded. Further clinical evaluation is recommended, along with close attention on follow-up studies to ensure the regression of these findings. 2. Previously noted osseous lesions appear very similar to the prior study, as above. No definite new osseous lesions are otherwise noted. 3. No definite signs of metastatic disease noted in the abdomen or pelvis. 4. Cholelithiasis without evidence of acute cholecystitis. 5. Large hiatal hernia with nearly completely intrathoracic stomach, similar to the prior study. 6. Aortic  atherosclerosis, in addition to left anterior descending coronary artery disease. Assessment for potential risk factor modification, dietary therapy or pharmacologic therapy may be warranted, if clinically indicated. 7. Additional incidental findings, as above.   Metastasis to bone (HCC)  09/23/2022 Initial Diagnosis   Metastasis to bone [facility])    INTERVAL HISTORY Peter Stuart is a 77 y.o. male who has above history reviewed by me today presents for follow up visit for metastatic RCC - he takes oxycodone Q6h PRN. Has not started on Oxycodone ER, awaiting insurance approval. - 02/12/23 s/p Right L2/L3 Nerve Root Block pain has improved and got worse again.  - 03/11/23 S/p right L3-L4 nerve root block by IR, pain is slightly better He does not eat well, partially due to not able to cook for himself due to the pain.  He continues to have lright hip pain and lower back pain, worse with standing up and walking.    Review of Systems  Constitutional:  Negative for appetite change, chills, fever and unexpected weight change.  HENT:   Negative for hearing loss and voice change.   Eyes:  Negative for eye problems and icterus.  Respiratory:  Negative for chest tightness, cough and shortness of breath.   Cardiovascular:  Negative for chest pain and leg swelling.  Gastrointestinal:  Negative for abdominal distention and abdominal pain.  Endocrine: Negative for hot flashes.  Genitourinary:  Negative for difficulty urinating, dysuria and frequency.   Musculoskeletal:  Positive for back pain. Negative for arthralgias.       Right hip pain  Skin:  Negative for itching.  Neurological:  Negative for light-headedness and numbness.  Hematological:  Negative for adenopathy. Does not bruise/bleed easily.  Psychiatric/Behavioral:  Negative for confusion.     MEDICAL HISTORY:  Past Medical History:  Diagnosis Date   Anemia    Cancer (HCC)    Cancer of kidney (HCC)    CKD (chronic kidney disease) stage  3, GFR 30-59 ml/min (HCC)    Complication of anesthesia    slow to wake after 1 surgery   CPAP use counseling    Diabetes mellitus without complication (HCC)    HLD (hyperlipidemia)    HTN (hypertension)    OSA on CPAP    PONV (postoperative nausea and vomiting)    Prostate cancer (HCC)    Seasonal allergies    Skin cancer     SURGICAL HISTORY: Past Surgical History:  Procedure Laterality Date   FEMUR IM NAIL Left 11/26/2022   Procedure: LEFT INTRAMEDULLARY (IM) NAIL FEMORAL;  Surgeon: Lyndle Herrlich, MD;  Location: [facility] ORS;  Service: Orthopedics;  Laterality: Left;   HERNIA REPAIR     IR FLUORO GUIDED NEEDLE PLC ASPIRATION/INJECTION LOC  07/02/2022   IR RADIOLOGIST EVAL & MGMT  02/11/2023   IR RADIOLOGIST EVAL & MGMT  03/11/2023   LAPAROSCOPIC APPENDECTOMY N/A 04/27/2021   Procedure: APPENDECTOMY LAPAROSCOPIC;  Surgeon: Duanne Guess, MD;  Location: [facility] ORS;  Service: General;  Laterality: N/A;   LAPAROSCOPIC NEPHRECTOMY, HAND ASSISTED Left 06/25/2021   Procedure: HAND ASSISTED LAPAROSCOPIC NEPHRECTOMY;  Surgeon: Sondra Come, MD;  Location: [facility] ORS;  Service: Urology;  Laterality: Left;   prostatectomy     REPAIR KNEE LIGAMENT     SHOULDER ARTHROSCOPY WITH SUBACROMIAL DECOMPRESSION AND OPEN ROTATOR C Right 12/29/2020   Procedure: Right shoulder arthroscopic rotator cuff repair, subacromial decompression, and biceps tenodesis;  Surgeon: Signa Kell, MD;  Location: [facility];  Service: Orthopedics;  Laterality: Right;   TONSILLECTOMY     VASECTOMY      SOCIAL HISTORY: Social History   Socioeconomic History   Marital status: Widowed    Spouse name: Not on file   Number of children: Not on file   Years of education: Not on file   Highest education level: Not on file  Occupational History   Not on file  Tobacco Use   Smoking status: Never    Passive exposure: Never   Smokeless tobacco: Never  Vaping Use   Vaping Use: Never used  Substance and Sexual  Activity   Alcohol use: Not Currently   Drug use: Never   Sexual activity: Not Currently  Other Topics Concern   Not on file  Social History Narrative   Not on file   Social Determinants of Health   Financial Resource Strain: Not on file  Food Insecurity: No Food Insecurity (11/27/2022)   Hunger Vital Sign    Worried About Running Out of Food in the Last Year: Never true    Ran Out of Food in the Last Year: Never true  Transportation Needs: No Transportation Needs (11/27/2022)   PRAPARE - Administrator, Civil Service (Medical): No    Lack of Transportation (Non-Medical): No  Physical Activity: Not on file  Stress: Not on file  Social Connections: Not on file  Intimate Partner Violence: Not At Risk (11/27/2022)   Humiliation, Afraid, Rape, and Kick questionnaire    Fear of Current or Ex-Partner: No    Emotionally Abused: No    Physically Abused: No    Sexually Abused:  No    FAMILY HISTORY: Family History  Problem Relation Age of Onset   Stroke Mother    Hypertension Father    Emphysema Father     ALLERGIES:  is allergic to robaxin [methocarbamol] and tramadol.  MEDICATIONS:  Current Outpatient Medications  Medication Sig Dispense Refill   ALLEGRA ALLERGY 60 MG tablet Take 60 mg by mouth daily.     cholecalciferol (VITAMIN D3) 25 MCG (1000 UNIT) tablet Take 1,000 Units by mouth daily.     clotrimazole (LOTRIMIN) 1 % cream Apply topically 2 (two) times daily.     Coenzyme Q10 10 MG capsule Take 10 mg by mouth daily.     diphenhydrAMINE-Zinc Acetate (BENADRYL ITCH RELIEF EX) Apply topically.     ezetimibe (ZETIA) 10 MG tablet Take 1 tablet by mouth daily.     fluticasone (FLONASE) 50 MCG/ACT nasal spray Place 2 sprays into both nostrils daily.     gabapentin (NEURONTIN) 600 MG tablet Take 600 mg by mouth at bedtime.     GINKGO BILOBA COMPLEX PO Take 1 tablet by mouth daily.     hydrochlorothiazide (HYDRODIURIL) 25 MG tablet Take 1 tablet by mouth daily.      losartan (COZAAR) 100 MG tablet Take 100 mg by mouth daily.     megestrol (MEGACE) 40 MG tablet Take 1 tablet (40 mg total) by mouth daily. 30 tablet 0   Multiple Vitamin (MULTIVITAMIN) capsule Take 1 capsule by mouth daily.     multivitamin-lutein (OCUVITE-LUTEIN) CAPS capsule Take 1 capsule by mouth daily.     oxyCODONE ER (XTAMPZA ER) 9 MG C12A Take 1 capsule by mouth every 12 (twelve) hours. For long acting pain control d/t malignancy (Patient not taking: Reported on 04/02/2023) 60 capsule 0   pantoprazole (PROTONIX) 40 MG tablet Take 1 tablet (40 mg total) by mouth daily. 30 tablet 2   Pembrolizumab (KEYTRUDA IV) Inject into the vein.     polyethylene glycol (MIRALAX / GLYCOLAX) 17 g packet Take 17 g by mouth daily.     PREVIDENT 5000 BOOSTER PLUS 1.1 % PSTE Take by mouth.     triamcinolone cream (KENALOG) 0.1 % Apply 1 Application topically 2 (two) times daily.     oxyCODONE (OXY IR/ROXICODONE) 5 MG immediate release tablet Take 1 tablet (5 mg total) by mouth every 6 (six) hours as needed for severe pain. 120 tablet 0   No current facility-administered medications for this visit.     PHYSICAL EXAMINATION: ECOG PERFORMANCE STATUS: 1 - Symptomatic but completely ambulatory Vitals:   04/02/23 0835  BP: 113/76  Pulse: 84  Resp: 18  Temp: 97.8 F (36.6 C)  SpO2: 99%   Filed Weights   04/02/23 0835  Weight: 205 lb (93 kg)    Physical Exam Constitutional:      Appearance: He is obese.  HENT:     Head: Normocephalic and atraumatic.  Eyes:     General: No scleral icterus. Cardiovascular:     Rate and Rhythm: Normal rate.     Heart sounds: Normal heart sounds.  Pulmonary:     Effort: Pulmonary effort is normal. No respiratory distress.     Breath sounds: No wheezing.  Abdominal:     General: Bowel sounds are normal. There is no distension.     Palpations: Abdomen is soft.  Musculoskeletal:        General: No deformity. Normal range of motion.     Cervical back: Normal  range of motion and neck supple.  Skin:    General: Skin is warm and dry.     Findings: No erythema.  Neurological:     Mental Status: He is alert and oriented to person, place, and time. Mental status is at baseline.  Psychiatric:        Mood and Affect: Mood normal.     LABORATORY DATA:  I have reviewed the data as listed    Latest Ref Rng & Units 04/02/2023    8:15 AM 03/27/2023   10:32 AM 03/12/2023    8:20 AM  CBC  WBC 4.0 - 10.5 K/uL 10.0  9.3  7.4   Hemoglobin 13.0 - 17.0 g/dL 16.1  09.6  04.5   Hematocrit 39.0 - 52.0 % 42.1  45.7  41.6   Platelets 150 - 400 K/uL 188  195  194       Latest Ref Rng & Units 04/02/2023    8:15 AM 03/27/2023   10:32 AM 03/12/2023    8:20 AM  CMP  Glucose 70 - 99 mg/dL 409  811  914   BUN 8 - 23 mg/dL 34  35  35   Creatinine 0.61 - 1.24 mg/dL 7.82  9.56  2.13   Sodium 135 - 145 mmol/L 133  130  131   Potassium 3.5 - 5.1 mmol/L 3.3  3.7  3.6   Chloride 98 - 111 mmol/L 96  93  94   CO2 22 - 32 mmol/L 24  23  25    Calcium 8.9 - 10.3 mg/dL 8.5  9.2  08.6   Total Protein 6.5 - 8.1 g/dL 6.3  7.1  6.5   Total Bilirubin 0.3 - 1.2 mg/dL 0.8  0.8  0.9   Alkaline Phos 38 - 126 U/L 43  51  49   AST 15 - 41 U/L 15  22  19    ALT 0 - 44 U/L 15  18  26      RADIOGRAPHIC STUDIES: I have personally reviewed the radiological images as listed and agreed with the findings in the report. DG NERVE ROOT BLOCK LUMBAR SACRAL  Result Date: 03/11/2023 CLINICAL DATA:  Right lower extremity radiculopathy in the setting of osseous metastatic disease with nerve root compression; previous right L2-L3 nerve root block with moderate improvement in symptoms that lasted for 2 weeks, now presents for second injection at L3-L4 based on review of imaging FLUOROSCOPY: Radiation Exposure Index (as provided by the fluoroscopic device): 1.0 minutes (6 mGy) PROCEDURE: The procedure, risks, benefits, and alternatives were explained to the patient. Questions regarding the procedure were  encouraged and answered. The patient understands and consents to the procedure. LUMBAR EPIDURAL INJECTION: A transforaminal approach was performed on right at L3-L4. The overlying skin was cleansed and anesthetized. A 3 22 gauge spinal needle was advanced towards the identified neural foramen using anatomic landmarks. DIAGNOSTIC EPIDURAL INJECTION: Injection of Isovue-M 200 shows a good epidural pattern with spread along the nerve root sleeve. No vascular opacification is seen. THERAPEUTIC EPIDURAL INJECTION: 80 mg of Depo-Medrol mixed with 1 mL 1% lidocaine were instilled. The procedure was well-tolerated, and the patient was discharged thirty minutes following the injection in good condition. COMPLICATIONS: None immediate IMPRESSION: Successful epidural steroid injection/selective nerve root block of the right L3-L4 nerve root via a transforaminal approach. Electronically Signed   By: Olive Bass M.D.   On: 03/11/2023 16:41   IR Radiologist Eval & Mgmt  Result Date: 03/11/2023 EXAM: ESTABLISHED PATIENT OFFICE VISIT CHIEF COMPLAINT: Documented in the EMR  HISTORY OF PRESENT ILLNESS: Patient follows up with me today for right lower extremity radiculopathy caused by known metastatic renal cancer involving the L2 vertebral body with extension into the neural foramina. Patient underwent subsequent right L2-L3 nerve root block on February 12, 2023. Patient reported moderate improvement in symptoms, with his right lower extremity pain improving from 6/10 to 4/10. His relief lasted approximately 2 weeks. His pain has now returned close to his baseline, at around 6/10. His pain is worse in the mornings with mild improvement with oxycodone. His pain is better in the afternoons in the evenings. He does have some pain in the lower back that is intermittent, but he reports that his more significant pain is his radiculopathy towards his right hip and right knee. It is particularly bothersome in the mornings when he is  walking or standing to prepare food in the kitchen. Given his moderate improvement in symptoms following his first nerve root block, will plan to perform a second nerve root block at an adjacent level given coexisting involvement at L3-L4 on the right side. REVIEW OF SYSTEMS: Documented in the EMR PHYSICAL EXAMINATION: Documented in the EMR ASSESSMENT AND PLAN: Documented in the EMR Electronically Signed   By: Olive Bass M.D.   On: 03/11/2023 09:55

## 2023-04-03 ENCOUNTER — Telehealth: Payer: Self-pay | Admitting: *Deleted

## 2023-04-03 NOTE — Telephone Encounter (Signed)
L returned to Ms Thayer Ohm and she denies any symptoms and will monitor temp and call back if rises or continues

## 2023-04-03 NOTE — Telephone Encounter (Signed)
Angelica Chessman called reporting that patient has a tem of 100.2 after receiving his Keytruda yesterday. She has given him Tylenol and is asking what else she needs to do for him. Please return er call

## 2023-04-04 ENCOUNTER — Other Ambulatory Visit: Payer: Self-pay | Admitting: Hospice and Palliative Medicine

## 2023-04-04 DIAGNOSIS — C7951 Secondary malignant neoplasm of bone: Secondary | ICD-10-CM

## 2023-04-07 NOTE — Discharge Instructions (Signed)
Kyphoplasty Post Procedure Discharge Instructions  May resume a regular diet and any medications that you routinely take (including pain medications). However, if you are taking Aspirin or an anticoagulant/blood thinner you will be told when you can resume taking these by the healthcare provider. No driving day of procedure. The day of your procedure take it easy. You may use an ice pack as needed to injection sites on back.  Ice to back 30 minutes on and 30 minutes off, as needed. May remove bandaids tomorrow after taking a shower. Replace daily with a clean bandaid until healed.  Do not lift anything heavier than a milk jug for 1-2 weeks or determined by your physician.  Follow up with your physician in 2 weeks.    Please contact our office at [phone] for the following symptoms or if you have any questions:  Fever greater than 100 degrees Increased swelling, pain, or redness at injection site. Increased back and/or leg pain New numbness or change in symptoms from before the procedure.    Thank you for visiting [facility]. 

## 2023-04-08 ENCOUNTER — Ambulatory Visit
Admission: RE | Admit: 2023-04-08 | Discharge: 2023-04-08 | Disposition: A | Discharge status: Home / Self Care | Payer: Medicare Other | Source: Ambulatory Visit | Attending: Hospice and Palliative Medicine | Admitting: Hospice and Palliative Medicine

## 2023-04-08 ENCOUNTER — Other Ambulatory Visit: Payer: Self-pay | Admitting: Hospice and Palliative Medicine

## 2023-04-08 DIAGNOSIS — C7951 Secondary malignant neoplasm of bone: Secondary | ICD-10-CM

## 2023-04-08 DIAGNOSIS — G893 Neoplasm related pain (acute) (chronic): Secondary | ICD-10-CM

## 2023-04-08 HISTORY — PX: IR KYPHO LUMBAR INC FX REDUCE BONE BX UNI/BIL CANNULATION INC/IMAGING: IMG5519

## 2023-04-08 HISTORY — PX: IR BONE TUMOR(S)RF ABLATION: IMG2284

## 2023-04-08 MED ORDER — MIDAZOLAM HCL 2 MG/2ML IJ SOLN
INTRAMUSCULAR | Status: AC | PRN
  Administered 2023-04-08: .5 mg via INTRAVENOUS
  Administered 2023-04-08: 1 mg via INTRAVENOUS
  Administered 2023-04-08: .5 mg via INTRAVENOUS
  Administered 2023-04-08: 1 mg via INTRAVENOUS
  Administered 2023-04-08 (×2): .5 mg via INTRAVENOUS

## 2023-04-08 MED ORDER — MIDAZOLAM HCL 2 MG/2ML IJ SOLN: 1.0000 mg | INTRAMUSCULAR | Status: DC | PRN

## 2023-04-08 MED ORDER — FENTANYL CITRATE PF 50 MCG/ML IJ SOSY: 25.0000 ug | PREFILLED_SYRINGE | INTRAMUSCULAR | Status: DC | PRN

## 2023-04-08 MED ORDER — FENTANYL CITRATE (PF) 100 MCG/2ML IJ SOLN
INTRAMUSCULAR | Status: AC | PRN
  Administered 2023-04-08: 25 ug via INTRAVENOUS
  Administered 2023-04-08: 50 ug via INTRAVENOUS
  Administered 2023-04-08 (×5): 25 ug via INTRAVENOUS

## 2023-04-08 MED ORDER — CEFAZOLIN SODIUM-DEXTROSE 2-4 GM/100ML-% IV SOLN
2.0000 g | INTRAVENOUS | Status: AC
  Administered 2023-04-08: 2 g via INTRAVENOUS

## 2023-04-08 MED ORDER — ACETAMINOPHEN 10 MG/ML IV SOLN
1000.0000 mg | Freq: Once | INTRAVENOUS | Status: AC
  Administered 2023-04-08: 1000 mg via INTRAVENOUS

## 2023-04-08 MED ORDER — SODIUM CHLORIDE 0.9 % IV SOLN: INTRAVENOUS | Status: DC

## 2023-04-08 NOTE — Progress Notes (Signed)
Pt back in nursing recovery area. Pt is alert, calm and pleasant ans has no s/s of distress. VS and orders assessed ans pt denies additional complaints. Family at the bedside. Pt follows commands, talks in complete sentences and has no complaints at this time. Pt will remain in nursing station until discharge.

## 2023-04-11 ENCOUNTER — Inpatient Hospital Stay (HOSPITAL_BASED_OUTPATIENT_CLINIC_OR_DEPARTMENT_OTHER): Payer: Medicare Other | Admitting: Hospice and Palliative Medicine

## 2023-04-11 DIAGNOSIS — G893 Neoplasm related pain (acute) (chronic): Secondary | ICD-10-CM

## 2023-04-11 DIAGNOSIS — Z515 Encounter for palliative care: Secondary | ICD-10-CM

## 2023-04-11 DIAGNOSIS — C642 Malignant neoplasm of left kidney, except renal pelvis: Secondary | ICD-10-CM

## 2023-04-11 NOTE — Progress Notes (Signed)
Virtual Visit via Telephone Note  I connected with Peter Stuart on 04/11/23 at  3:00 PM EDT by telephone and verified that I am speaking with the correct person using two identifiers.  Location: Patient: Home Provider: Clinic   I discussed the limitations, risks, security and privacy concerns of performing an evaluation and management service by telephone and the availability of in person appointments. I also discussed with the patient that there may be a patient responsible charge related to this service. The patient expressed understanding and agreed to proceed.   History of Present Illness: Peter Stuart is a 77 y.o. 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  Patient is currently on treatment with Keytruda.  Patient is status post XRT to his L-spine and left femoral shaft. Patient was hospitalized 11/26/2022 to 11/28/2022 with pathological fracture of the left femur and underwent pinning.    Observations/Objective: Spoke with patient by phone.  He has had ongoing pain and recently underwent steroid injections to L2 nerve root without significant improvement.  Patient then underwent radiofrequency ablation and kyphoplasty but this too is had minimal benefit.  Patient says that he has started back taking oxycodone several times a day.  It does the pain but he finds the effect is short-lived.  Of note, he was prescribed Xtampza ER but says that he never picked it up from the pharmacy.  We discussed the benefits of a long-acting opioid with patients that are requiring short acting medications around-the-clock.  Patient intends to start taking the [facility] ER.  Assessment and Plan: Metastatic RCC -on immunotherapy  Neoplasm related pain -recommended that patient start taking Xtampza ER as previously prescribed.  Continue oxycodone IR as needed for breakthrough pain.  Follow Up Instructions: Follow-up telephone visit 3 to 4 weeks   I discussed the  assessment and treatment plan with the patient. The patient was provided an opportunity to ask questions and all were answered. The patient agreed with the plan and demonstrated an understanding of the instructions.   The patient was advised to call back or seek an in-person evaluation if the symptoms worsen or if the condition fails to improve as anticipated.  I provided 10 minutes of non-face-to-face time during this encounter.   Malachy Moan, NP

## 2023-04-14 ENCOUNTER — Encounter: Payer: Self-pay | Admitting: Hospice and Palliative Medicine

## 2023-04-14 ENCOUNTER — Inpatient Hospital Stay (HOSPITAL_BASED_OUTPATIENT_CLINIC_OR_DEPARTMENT_OTHER): Payer: Medicare Other | Admitting: Hospice and Palliative Medicine

## 2023-04-14 DIAGNOSIS — C642 Malignant neoplasm of left kidney, except renal pelvis: Secondary | ICD-10-CM | POA: Diagnosis not present

## 2023-04-14 DIAGNOSIS — Z515 Encounter for palliative care: Secondary | ICD-10-CM | POA: Diagnosis not present

## 2023-04-14 NOTE — Progress Notes (Signed)
Virtual Visit via Telephone Note  I connected with Peter Stuart on 04/14/23 at  3:00 PM EDT by telephone and verified that I am speaking with the correct person using two identifiers.  Location: Patient: Home Provider: Clinic   I discussed the limitations, risks, security and privacy concerns of performing an evaluation and management service by telephone and the availability of in person appointments. I also discussed with the patient that there may be a patient responsible charge related to this service. The patient expressed understanding and agreed to proceed.   History of Present Illness: Peter Stuart is a 77 y.o. 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  Patient is currently on treatment with Keytruda.  Patient is status post XRT to his L-spine and left femoral shaft. Patient was hospitalized 11/26/2022 to 11/28/2022 with pathological fracture of the left femur and underwent pinning.    Observations/Objective: Patient was an add-on to my schedule today due to report of worsening pain.  I called and spoke with patient by phone.  He reports that over the last week or 2 he has had worsening bilateral shoulder pain/stiffness and some bilateral upper extremity weakness, which is worse when he first wakes in the morning and then progressively improves throughout the day.  Patient says that his back pain may be a "smidge better" after starting Xtampza ER.  Patient has not been taking oxycodone IR for breakthrough pain I suggested that he do so.  Patient denies other changes or concerns today.  Assessment and Plan: Metastatic RCC -on immunotherapy  Neoplasm related pain -continue Xtampza ER.  Recommend taking oxycodone IR as needed for breakthrough pain.  Shoulder pain/stiffness -symptoms concerning for polymyalgia rheumatica, particularly given description of worsening first thing in the morning.  Generally, I would recommend trial of steroid.  However,  this would counteract clinical efficacy of pembrolizumab.  Will have patient discuss next week with Dr. Cathie Hoops.  Will refer to OT.  Case and plan discussed with Dr. Cathie Hoops.  Follow Up Instructions: Follow-up telephone visit 3 to 4 weeks   I discussed the assessment and treatment plan with the patient. The patient was provided an opportunity to ask questions and all were answered. The patient agreed with the plan and demonstrated an understanding of the instructions.   The patient was advised to call back or seek an in-person evaluation if the symptoms worsen or if the condition fails to improve as anticipated.  I provided 10 minutes of non-face-to-face time during this encounter.   Malachy Moan, NP

## 2023-04-15 ENCOUNTER — Other Ambulatory Visit: Payer: Self-pay | Admitting: Interventional Radiology

## 2023-04-15 ENCOUNTER — Telehealth: Payer: Self-pay

## 2023-04-15 DIAGNOSIS — C7951 Secondary malignant neoplasm of bone: Secondary | ICD-10-CM

## 2023-04-15 NOTE — Telephone Encounter (Signed)
Phone call to pt to follow up from his kyphoplasty on 04/08/23. Pt reports his pain has minimally improved. Pt denies any signs of infection, redness at the site, draining or fever. Pt has no further complaints at this time and will be scheduled for a telephone follow up with Dr. Juliette Alcide next week. Pt advised to call back if anything were to change or any concerns arise and we will arrange an in person appointment. Pt verbalized understanding.

## 2023-04-16 ENCOUNTER — Telehealth: Payer: Self-pay | Admitting: *Deleted

## 2023-04-16 NOTE — Telephone Encounter (Signed)
Verbal order called to [facility] for order approval

## 2023-04-16 NOTE — Telephone Encounter (Signed)
Physical Therapist Thayer Ohm with [facility] called asking for order approval for this patient for Physical Therapy 1 week 9

## 2023-04-22 ENCOUNTER — Ambulatory Visit
Admission: RE | Admit: 2023-04-22 | Discharge: 2023-04-22 | Disposition: A | Discharge status: Home / Self Care | Payer: Medicare Other | Source: Ambulatory Visit | Attending: Interventional Radiology | Admitting: Interventional Radiology

## 2023-04-22 DIAGNOSIS — C7951 Secondary malignant neoplasm of bone: Secondary | ICD-10-CM

## 2023-04-22 HISTORY — PX: IR RADIOLOGIST EVAL & MGMT: IMG5224

## 2023-04-23 ENCOUNTER — Inpatient Hospital Stay: Payer: Medicare Other

## 2023-04-23 ENCOUNTER — Ambulatory Visit: Payer: Medicare Other | Admitting: Internal Medicine

## 2023-04-23 ENCOUNTER — Encounter: Payer: Self-pay | Admitting: Oncology

## 2023-04-23 ENCOUNTER — Inpatient Hospital Stay (HOSPITAL_BASED_OUTPATIENT_CLINIC_OR_DEPARTMENT_OTHER): Payer: Medicare Other | Admitting: Oncology

## 2023-04-23 VITALS — BP 110/83 | HR 96 | Temp 99.8°F | Resp 18 | Wt 202.4 lb

## 2023-04-23 DIAGNOSIS — C642 Malignant neoplasm of left kidney, except renal pelvis: Secondary | ICD-10-CM | POA: Diagnosis not present

## 2023-04-23 DIAGNOSIS — C61 Malignant neoplasm of prostate: Secondary | ICD-10-CM | POA: Diagnosis not present

## 2023-04-23 DIAGNOSIS — R4189 Other symptoms and signs involving cognitive functions and awareness: Secondary | ICD-10-CM

## 2023-04-23 DIAGNOSIS — Z5112 Encounter for antineoplastic immunotherapy: Secondary | ICD-10-CM | POA: Diagnosis not present

## 2023-04-23 DIAGNOSIS — C7951 Secondary malignant neoplasm of bone: Secondary | ICD-10-CM

## 2023-04-23 DIAGNOSIS — G893 Neoplasm related pain (acute) (chronic): Secondary | ICD-10-CM

## 2023-04-23 DIAGNOSIS — N1832 Chronic kidney disease, stage 3b: Secondary | ICD-10-CM | POA: Diagnosis not present

## 2023-04-23 DIAGNOSIS — R634 Abnormal weight loss: Secondary | ICD-10-CM

## 2023-04-23 DIAGNOSIS — C801 Malignant (primary) neoplasm, unspecified: Secondary | ICD-10-CM

## 2023-04-23 DIAGNOSIS — F411 Generalized anxiety disorder: Secondary | ICD-10-CM

## 2023-04-23 LAB — CBC WITH DIFFERENTIAL/PLATELET
Abs Immature Granulocytes: 0.06 10*3/uL (ref 0.00–0.07)
Basophils Absolute: 0.1 10*3/uL (ref 0.0–0.1)
Basophils Relative: 1 %
Eosinophils Absolute: 0.5 10*3/uL (ref 0.0–0.5)
Eosinophils Relative: 5 %
HCT: 41 % (ref 39.0–52.0)
Hemoglobin: 13.9 g/dL (ref 13.0–17.0)
Immature Granulocytes: 1 %
Lymphocytes Relative: 6 %
Lymphs Abs: 0.7 10*3/uL (ref 0.7–4.0)
MCH: 29.5 pg (ref 26.0–34.0)
MCHC: 33.9 g/dL (ref 30.0–36.0)
MCV: 87 fL (ref 80.0–100.0)
Monocytes Absolute: 1 10*3/uL (ref 0.1–1.0)
Monocytes Relative: 10 %
Neutro Abs: 8.2 10*3/uL — ABNORMAL HIGH (ref 1.7–7.7)
Neutrophils Relative %: 77 %
Platelets: 244 10*3/uL (ref 150–400)
RBC: 4.71 MIL/uL (ref 4.22–5.81)
RDW: 13.2 % (ref 11.5–15.5)
WBC: 10.5 10*3/uL (ref 4.0–10.5)
nRBC: 0 % (ref 0.0–0.2)

## 2023-04-23 LAB — COMPREHENSIVE METABOLIC PANEL
ALT: 55 U/L — ABNORMAL HIGH (ref 0–44)
AST: 26 U/L (ref 15–41)
Albumin: 3.4 g/dL — ABNORMAL LOW (ref 3.5–5.0)
Alkaline Phosphatase: 64 U/L (ref 38–126)
Anion gap: 9 (ref 5–15)
BUN: 30 mg/dL — ABNORMAL HIGH (ref 8–23)
CO2: 22 mmol/L (ref 22–32)
Calcium: 8.1 mg/dL — ABNORMAL LOW (ref 8.9–10.3)
Chloride: 101 mmol/L (ref 98–111)
Creatinine, Ser: 1.39 mg/dL — ABNORMAL HIGH (ref 0.61–1.24)
GFR, Estimated: 52 mL/min — ABNORMAL LOW (ref >60)
Glucose, Bld: 199 mg/dL — ABNORMAL HIGH (ref 70–99)
Potassium: 3.8 mmol/L (ref 3.5–5.1)
Sodium: 132 mmol/L — ABNORMAL LOW (ref 135–145)
Total Bilirubin: 0.7 mg/dL (ref 0.3–1.2)
Total Protein: 6.6 g/dL (ref 6.5–8.1)

## 2023-04-23 MED ORDER — LORAZEPAM 0.5 MG PO TABS: 0.5000 mg | ORAL_TABLET | Freq: Two times a day (BID) | ORAL | 0 refills | Status: DC | PRN

## 2023-04-23 NOTE — Assessment & Plan Note (Signed)
Recommend Ativan 0.5mg  BID PRN anxiety Rationale and side effects were reviewed with patient.

## 2023-04-23 NOTE — Assessment & Plan Note (Signed)
Follow up with urology

## 2023-04-23 NOTE — Assessment & Plan Note (Signed)
Follow up with nutritionist  Recommend healthy diet with high protein.  BMI 30   

## 2023-04-23 NOTE — Assessment & Plan Note (Signed)
S/p  SBRT to L2 and left femur.  Follow up with orthopedic surgeon - s/p Left IM nail femur surgery.  right ischium lesion-- discussed with Dr. Juliette Alcide, he does not think his symptoms  are directly from bone pain related to his metastatic disease, and therefore RFA is less likely to be helpful.  Right L2-L3 neuroforamen, s/p Right L2/L3 Nerve Root Block by IR 03/11/23 S/p right L3-L4 nerve root block by IR S/p IR L2 osteocool. No improvement of his pain.  I had a lengthy discussion with patient. Pain is likely due to radiculopathy. Refer to neurosurgeon.

## 2023-04-23 NOTE — Assessment & Plan Note (Signed)
Encourage oral hydration, avoid nephrotoxins 

## 2023-04-23 NOTE — Assessment & Plan Note (Signed)
Recommend patient to resume calcium supplementation, half tablet daily.  

## 2023-04-23 NOTE — Assessment & Plan Note (Addendum)
oxycodone 5mg  Q6h PRN  Oxycodone ER p Back pain and  hip pain, transiently with previous nerve block, however   Continue follow up with palliative care for titration of pain regimen.  Discussed with him about instructions of Tylenol, he may use Tylenol 650mg  Q4-6 hours as needed.   Recommend patient to increase garbapentin to 900-1200mg  daily. He will further discuss with his neurologist.

## 2023-04-23 NOTE — Assessment & Plan Note (Signed)
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 Biopsy of L2 + metastatic carcinoma, stage IV, limited disease burden.  Currently on palliative immunotherapy So far no visceral metastatic lesions, however, bone lesions are progressing. - will repeat CT image and bone scan in July on immunotherapy with local therapies of bone lesion.  Labs are reviewed and discussed with patient. 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

## 2023-04-23 NOTE — Progress Notes (Signed)
Hematology/Oncology Progress note [phone] [phone]   CHIEF COMPLAINTS/REASON FOR VISIT:  RCC  ASSESSMENT & PLAN:   Cancer Staging  Cancer of kidney, left [facility]) Staging form: Kidney, AJCC 8th Edition - Clinical stage from 06/25/2021: Stage III (cT3a, cNX, cM0) - Signed by Rickard Patience, MD on 08/06/2021   Cancer of kidney, left [facility]) History of stage III left RCC, Lytic bone lesion of L2 and intertrochanteric left femur, consistent with osseous metastasis. Biopsy of L2 + metastatic carcinoma, stage IV, limited disease burden.  Currently on palliative immunotherapy So far no visceral metastatic lesions, however, bone lesions are progressing. - will repeat CT image and bone scan in July on immunotherapy with local therapies of bone lesion.  Labs are reviewed and discussed with patient. 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     Prostate cancer (HCC) Follow up with urology  CKD (chronic kidney disease) stage 3, GFR 30-59 ml/min (HCC) Encourage oral hydration, avoid nephrotoxins  Metastasis to bone (HCC) S/p  SBRT to L2 and left femur.  Follow up with orthopedic surgeon - s/p Left IM nail femur surgery.  right ischium lesion-- discussed with Dr. Juliette Alcide, he does not think his symptoms  are directly from bone pain related to his metastatic disease, and therefore RFA is less likely to be helpful.  Right L2-L3 neuroforamen, s/p Right L2/L3 Nerve Root Block by IR 03/11/23 S/p right L3-L4 nerve root block by IR S/p IR L2 osteocool. No improvement of his pain.  I had a lengthy discussion with patient. Pain is likely due to radiculopathy. Refer to neurosurgeon.    Neoplasm related pain oxycodone 5mg  Q6h PRN  Oxycodone ER p Back pain and  hip pain, transiently with previous nerve block, however   Continue follow up with palliative care for titration of pain regimen.  Discussed with him about instructions of  Tylenol, he may use Tylenol 650mg  Q4-6 hours as needed.   Recommend patient to increase garbapentin to 900-1200mg  daily. He will further discuss with his neurologist.     Weight loss Follow up with nutritionist  Recommend healthy diet with high protein.  BMI 30    Hypocalcemia Recommend patient to resume calcium supplementation, half tablet daily.   Anxiety associated with cancer diagnosis (HCC) Recommend Ativan 0.5mg  BID PRN anxiety Rationale and side effects were reviewed with patient.    Cognitive impairment MRI brain w wo contrast Check B12   Orders Placed This Encounter  Procedures   MR Brain W Wo Contrast    Standing Status:   Future    Standing Expiration Date:   04/22/2024    Order Specific Question:   If indicated for the ordered procedure, I authorize the administration of contrast media per Radiology protocol    Answer:   Yes    Order Specific Question:   What is the patient's sedation requirement?    Answer:   No Sedation    Order Specific Question:   Does the patient have a pacemaker or implanted devices?    Answer:   No    Order Specific Question:   Use SRS Protocol?    Answer:   No    Order Specific Question:   Preferred imaging location?    Answer:   [facility] (table limit - 550lbs)   NM Bone Scan Whole Body    Standing Status:   Future    Standing Expiration Date:   04/22/2024    Order Specific  Question:   If indicated for the ordered procedure, I authorize the administration of a radiopharmaceutical per Radiology protocol    Answer:   Yes    Order Specific Question:   Preferred imaging location?    Answer:   [facility]   CT Abdomen Pelvis Wo Contrast    Pt to drink oral contrast    Standing Status:   Future    Standing Expiration Date:   04/22/2024    Order Specific Question:   Preferred imaging location?    Answer:   [facility]    Order Specific Question:   If indicated for the ordered procedure, I authorize the administration  of oral contrast media per Radiology protocol    Answer:   Yes    Order Specific Question:   Does the patient have a contrast media/X-ray dye allergy?    Answer:   No   CBC with Differential    Standing Status:   Future    Standing Expiration Date:   05/20/2024   Comprehensive metabolic panel    Standing Status:   Future    Standing Expiration Date:   05/20/2024   Ambulatory referral to Neurosurgery    Referral Priority:   Routine    Referral Type:   Surgical    Referral Reason:   Specialty Services Required    Referred to Provider:   Venetia Night, MD    Requested Specialty:   Neurosurgery    Number of Visits Requested:   1   I had lengthy discussion with patient with daughters, All questions were answered to their satisfaction   Follow up 3 weeks lab MD [facility] All questions were answered. The patient knows to call the clinic with any problems, questions or concerns.  Rickard Patience, MD, PhD [facility] Hematology Oncology 04/23/2023    HISTORY OF PRESENTING ILLNESS:   Peter Stuart is a  77 y.o.  male presents for follow-up of history of left RCC and history of prostate cancer. Oncology history summary listed as below. Oncology History  Cancer of kidney, left (HCC)  2009 Initial Diagnosis   History of prostate cancer treated with robotic prostatectomy initial   04/27/2021 Imaging   CT abdomen pelvis showed acute appendicitis without evidence of perforation or abscess.  Large left renal mass consistent with renal malignancy with renal vein invasion.  Focal annular thickening of descending colon.  05/01/2021 CT chest with contrast showed large sliding-type hiatal hernia.  Mild bilateral posterior bibasilar subsegmental atelectasis   06/25/2021 Initial Diagnosis   Cancer of kidney, left  06/25/2021, patient underwent radical nephrectomy by Dr. Richardo Hanks. Pathology showed clear-cell renal cell carcinoma, nuclear grade 2.  Benign adrenal tissue.  Lymphovascular invasion present. pT3a  pNx    06/25/2021 Cancer Staging   Staging form: Kidney, AJCC 8th Edition - Clinical stage from 06/25/2021: Stage III (cT3a, cNX, cM0) - Signed by Rickard Patience, MD on 08/06/2021 Stage prefix: Initial diagnosis   05/29/2022 Imaging   CT chest abdomen pelvis with contrast showed -1. New lytic lesions in the L2 vertebral body and intertrochanteric left femur are most consistent with osseous metastatic disease. 2. Prior left nephrectomy with unchanged size of the soft tissue and fluid collection in the nephrectomy bed favored to reflect postsurgical change given stability. Continued attention on follow-up imaging suggested. 3. Small lucent focus in the T11 vertebral body is nonspecific but stable from prior imaging suggestive of a benign etiology, attention on follow-up imaging suggested. 4. Stable small bilateral pulmonary nodules, continued  attention on follow-up imaging suggested. 5. Large hiatal hernia/intrathoracic stomach also containing a portion of the pancreatic body. 6. Prior prostatectomy without suspicious enhancing nodularity in the prostatectomy bed.   07/15/2022 Procedure   L2 vertebral body CT guided biopsy showed positive for malignancy, metastatic carcinoma, morphologically compatible with known clear cell renal cell carcinoma.    07/19/2022 -  Chemotherapy   RENAL CELL Pembrolizumab (200)      10/30/2022 - 11/18/2022 Radiation Therapy   Status post SBRT to both L2 as well as left femur.   11/26/2022 Surgery   Status post left intramedullary nail   02/14/2023 Imaging   CT chest abdomen pelvis wo contrast  1. Interval development of areas of ground-glass attenuation and nodularity in the lungs bilaterally. This is nonspecific. In the appropriate clinical setting, this could represent atypical infection, particularly if the patient is immunocompromised. Alternatively, the possibility of metastatic disease with peritumoral hemorrhage is not entirely excluded. Further clinical evaluation  is recommended, along with close attention on follow-up studies to ensure the regression of these findings. 2. Previously noted osseous lesions appear very similar to the prior study, as above. No definite new osseous lesions are otherwise noted. 3. No definite signs of metastatic disease noted in the abdomen or pelvis. 4. Cholelithiasis without evidence of acute cholecystitis. 5. Large hiatal hernia with nearly completely intrathoracic stomach, similar to the prior study. 6. Aortic atherosclerosis, in addition to left anterior descending coronary artery disease. Assessment for potential risk factor modification, dietary therapy or pharmacologic therapy may be warranted, if clinically indicated. 7. Additional incidental findings, as above.   Metastasis to bone (HCC)  09/23/2022 Initial Diagnosis   Metastasis to bone [facility])    INTERVAL HISTORY Peter Stuart is a 77 y.o. male who has above history reviewed by me today presents for follow up visit for metastatic RCC - he takes oxycodone Q6h PRN. Oxycodone ER, awaiting insurance approval. 04/08/23 status post L2 OsteoCool, pain did not improve.  He does not eat well, partially due to not able to cook for himself due to the pain. Continues to loose weight.  He continues to have lright hip pain and lower back pain, worse with standing up and walking. He also complains shoulder pain, neck pain.  Accompanied by daughters. Family reports that patient appears to be anxious, more forgetful recently as well.     Review of Systems  Constitutional:  Positive for appetite change, fatigue and unexpected weight change. Negative for chills and fever.  HENT:   Negative for hearing loss and voice change.   Eyes:  Negative for eye problems and icterus.  Respiratory:  Negative for chest tightness, cough and shortness of breath.   Cardiovascular:  Negative for chest pain and leg swelling.  Gastrointestinal:  Negative for abdominal distention and abdominal pain.   Endocrine: Negative for hot flashes.  Genitourinary:  Negative for difficulty urinating, dysuria and frequency.   Musculoskeletal:  Positive for arthralgias and back pain.       Right hip pain  Skin:  Negative for itching.  Neurological:  Negative for light-headedness and numbness.  Hematological:  Negative for adenopathy. Does not bruise/bleed easily.  Psychiatric/Behavioral:  Negative for confusion.     MEDICAL HISTORY:  Past Medical History:  Diagnosis Date   Anemia    Cancer (HCC)    Cancer of kidney (HCC)    CKD (chronic kidney disease) stage 3, GFR 30-59 ml/min (HCC)    Complication of anesthesia    slow to wake after  1 surgery   CPAP use counseling    Diabetes mellitus without complication (HCC)    HLD (hyperlipidemia)    HTN (hypertension)    OSA on CPAP    PONV (postoperative nausea and vomiting)    Prostate cancer (HCC)    Seasonal allergies    Skin cancer     SURGICAL HISTORY: Past Surgical History:  Procedure Laterality Date   FEMUR IM NAIL Left 11/26/2022   Procedure: LEFT INTRAMEDULLARY (IM) NAIL FEMORAL;  Surgeon: Lyndle Herrlich, MD;  Location: [facility] ORS;  Service: Orthopedics;  Laterality: Left;   HERNIA REPAIR     IR BONE TUMOR(S)RF ABLATION  04/08/2023   IR FLUORO GUIDED NEEDLE PLC ASPIRATION/INJECTION LOC  07/02/2022   IR KYPHO LUMBAR INC FX REDUCE BONE BX UNI/BIL CANNULATION INC/IMAGING  04/08/2023   IR RADIOLOGIST EVAL & MGMT  02/11/2023   IR RADIOLOGIST EVAL & MGMT  03/11/2023   IR RADIOLOGIST EVAL & MGMT  04/02/2023   IR RADIOLOGIST EVAL & MGMT  04/22/2023   LAPAROSCOPIC APPENDECTOMY N/A 04/27/2021   Procedure: APPENDECTOMY LAPAROSCOPIC;  Surgeon: Duanne Guess, MD;  Location: [facility] ORS;  Service: General;  Laterality: N/A;   LAPAROSCOPIC NEPHRECTOMY, HAND ASSISTED Left 06/25/2021   Procedure: HAND ASSISTED LAPAROSCOPIC NEPHRECTOMY;  Surgeon: Sondra Come, MD;  Location: [facility] ORS;  Service: Urology;  Laterality: Left;   prostatectomy     REPAIR KNEE  LIGAMENT     SHOULDER ARTHROSCOPY WITH SUBACROMIAL DECOMPRESSION AND OPEN ROTATOR C Right 12/29/2020   Procedure: Right shoulder arthroscopic rotator cuff repair, subacromial decompression, and biceps tenodesis;  Surgeon: Signa Kell, MD;  Location: [facility];  Service: Orthopedics;  Laterality: Right;   TONSILLECTOMY     VASECTOMY      SOCIAL HISTORY: Social History   Socioeconomic History   Marital status: Widowed    Spouse name: Not on file   Number of children: Not on file   Years of education: Not on file   Highest education level: Not on file  Occupational History   Not on file  Tobacco Use   Smoking status: Never    Passive exposure: Never   Smokeless tobacco: Never  Vaping Use   Vaping Use: Never used  Substance and Sexual Activity   Alcohol use: Not Currently   Drug use: Never   Sexual activity: Not Currently  Other Topics Concern   Not on file  Social History Narrative   Not on file   Social Determinants of Health   Financial Resource Strain: Not on file  Food Insecurity: No Food Insecurity (11/27/2022)   Hunger Vital Sign    Worried About Running Out of Food in the Last Year: Never true    Ran Out of Food in the Last Year: Never true  Transportation Needs: No Transportation Needs (11/27/2022)   PRAPARE - Administrator, Civil Service (Medical): No    Lack of Transportation (Non-Medical): No  Physical Activity: Not on file  Stress: Not on file  Social Connections: Not on file  Intimate Partner Violence: Not At Risk (11/27/2022)   Humiliation, Afraid, Rape, and Kick questionnaire    Fear of Current or Ex-Partner: No    Emotionally Abused: No    Physically Abused: No    Sexually Abused: No    FAMILY HISTORY: Family History  Problem Relation Age of Onset   Stroke Mother    Hypertension Father    Emphysema Father     ALLERGIES:  is allergic to  robaxin [methocarbamol] and tramadol.  MEDICATIONS:  Current Outpatient Medications   Medication Sig Dispense Refill   fluticasone (FLONASE) 50 MCG/ACT nasal spray Place 2 sprays into both nostrils daily.     gabapentin (NEURONTIN) 600 MG tablet Take 600 mg by mouth at bedtime.     LORazepam (ATIVAN) 0.5 MG tablet Take 1 tablet (0.5 mg total) by mouth every 12 (twelve) hours as needed for anxiety. 30 tablet 0   oxyCODONE (OXY IR/ROXICODONE) 5 MG immediate release tablet Take 1 tablet (5 mg total) by mouth every 6 (six) hours as needed for severe pain. 120 tablet 0   oxyCODONE ER (XTAMPZA ER) 9 MG C12A Take 1 capsule by mouth every 12 (twelve) hours. For long acting pain control d/t malignancy 60 capsule 0   Pembrolizumab (KEYTRUDA IV) Inject into the vein.     polyethylene glycol (MIRALAX / GLYCOLAX) 17 g packet Take 17 g by mouth daily.     ALLEGRA ALLERGY 60 MG tablet Take 60 mg by mouth daily. (Patient not taking: Reported on 04/23/2023)     cholecalciferol (VITAMIN D3) 25 MCG (1000 UNIT) tablet Take 1,000 Units by mouth daily. (Patient not taking: Reported on 04/23/2023)     clotrimazole (LOTRIMIN) 1 % cream Apply topically 2 (two) times daily. (Patient not taking: Reported on 04/23/2023)     Coenzyme Q10 10 MG capsule Take 10 mg by mouth daily. (Patient not taking: Reported on 04/23/2023)     diphenhydrAMINE-Zinc Acetate (BENADRYL ITCH RELIEF EX) Apply topically. (Patient not taking: Reported on 04/23/2023)     ezetimibe (ZETIA) 10 MG tablet Take 1 tablet by mouth daily. (Patient not taking: Reported on 04/23/2023)     GINKGO BILOBA COMPLEX PO Take 1 tablet by mouth daily. (Patient not taking: Reported on 04/23/2023)     hydrochlorothiazide (HYDRODIURIL) 25 MG tablet Take 1 tablet by mouth daily. (Patient not taking: Reported on 04/23/2023)     losartan (COZAAR) 100 MG tablet Take 100 mg by mouth daily. (Patient not taking: Reported on 04/23/2023)     megestrol (MEGACE) 40 MG tablet Take 1 tablet (40 mg total) by mouth daily. (Patient not taking: Reported on 04/23/2023) 30 tablet 0    Multiple Vitamin (MULTIVITAMIN) capsule Take 1 capsule by mouth daily. (Patient not taking: Reported on 04/23/2023)     multivitamin-lutein (OCUVITE-LUTEIN) CAPS capsule Take 1 capsule by mouth daily. (Patient not taking: Reported on 04/23/2023)     pantoprazole (PROTONIX) 40 MG tablet Take 1 tablet (40 mg total) by mouth daily. (Patient not taking: Reported on 04/23/2023) 30 tablet 2   PREVIDENT 5000 BOOSTER PLUS 1.1 % PSTE Take by mouth. (Patient not taking: Reported on 04/23/2023)     triamcinolone cream (KENALOG) 0.1 % Apply 1 Application topically 2 (two) times daily. (Patient not taking: Reported on 04/23/2023)     No current facility-administered medications for this visit.     PHYSICAL EXAMINATION: ECOG PERFORMANCE STATUS: 1 - Symptomatic but completely ambulatory Vitals:   04/23/23 1016  BP: 110/83  Pulse: 96  Resp: 18  Temp: 99.8 F (37.7 C)  SpO2: 98%   Filed Weights   04/23/23 1016  Weight: 202 lb 6.4 oz (91.8 kg)    Physical Exam Constitutional:      Appearance: He is obese.  HENT:     Head: Normocephalic and atraumatic.  Eyes:     General: No scleral icterus. Cardiovascular:     Rate and Rhythm: Normal rate.     Heart sounds: Normal heart  sounds.  Pulmonary:     Effort: Pulmonary effort is normal. No respiratory distress.     Breath sounds: No wheezing.  Abdominal:     General: Bowel sounds are normal. There is no distension.     Palpations: Abdomen is soft.  Musculoskeletal:        General: No deformity. Normal range of motion.     Cervical back: Normal range of motion and neck supple.  Skin:    General: Skin is warm and dry.     Findings: No erythema.  Neurological:     Mental Status: He is alert and oriented to person, place, and time. Mental status is at baseline.  Psychiatric:        Mood and Affect: Mood normal.     LABORATORY DATA:  I have reviewed the data as listed    Latest Ref Rng & Units 04/23/2023   10:02 AM 04/02/2023    8:15 AM  03/27/2023   10:32 AM  CBC  WBC 4.0 - 10.5 K/uL 10.5  10.0  9.3   Hemoglobin 13.0 - 17.0 g/dL 47.8  29.5  62.1   Hematocrit 39.0 - 52.0 % 41.0  42.1  45.7   Platelets 150 - 400 K/uL 244  188  195       Latest Ref Rng & Units 04/23/2023   10:02 AM 04/02/2023    8:15 AM 03/27/2023   10:32 AM  CMP  Glucose 70 - 99 mg/dL 308  657  846   BUN 8 - 23 mg/dL 30  34  35   Creatinine 0.61 - 1.24 mg/dL 9.62  9.52  8.41   Sodium 135 - 145 mmol/L 132  133  130   Potassium 3.5 - 5.1 mmol/L 3.8  3.3  3.7   Chloride 98 - 111 mmol/L 101  96  93   CO2 22 - 32 mmol/L 22  24  23    Calcium 8.9 - 10.3 mg/dL 8.1  8.5  9.2   Total Protein 6.5 - 8.1 g/dL 6.6  6.3  7.1   Total Bilirubin 0.3 - 1.2 mg/dL 0.7  0.8  0.8   Alkaline Phos 38 - 126 U/L 64  43  51   AST 15 - 41 U/L 26  15  22    ALT 0 - 44 U/L 55  15  18     RADIOGRAPHIC STUDIES: I have personally reviewed the radiological images as listed and agreed with the findings in the report. IR Radiologist Eval & Mgmt  Result Date: 04/22/2023 EXAM: ESTABLISHED PATIENT OFFICE VISIT CHIEF COMPLAINT: Right lower extremity radiculopathy in the setting of osseous metastatic disease to the lumbar spine status post OsteoCool on 04/08/2023 HISTORY OF PRESENT ILLNESS: The patient has a history of RCC with osseous metastatic disease to the L2 vertebral body with resultant right lower extremity radiculopathy. Patient was initially treated with serial selective nerve root block/steroid injections targeting the right L2-L3 and right L3-L4 nerve roots. Although this initially resulted in moderate improvement in his pain, the relief is only short-lived lasting 2-3 weeks after each injection. Given the desire for more permanent pain relief, the decision was made to proceed with OsteoCool of the L2 vertebral body, with hopes of reducing tumor burden and relieving the nerve root compression. The patient underwent L2 OsteoCool on April 08, 2023 without complication. He follows up today  2 weeks after the procedure. Unfortunately, the patient states that he has had no appreciable pain relief since the procedure.  He has continued right lower extremity radiculopathy. The patient is now 2 weeks status post L2 OsteoCool without appreciable relief in his right lower extremity radiculopathy. He has had previous temporary relief with epidural steroid injections/selective nerve root blocks on the right side, which we can continue to offer. We will coordinate further with the oncology team. REVIEW OF SYSTEMS: See above PHYSICAL EXAMINATION: See above ASSESSMENT AND PLAN: See above.  Follow-up PRN. Electronically Signed   By: Olive Bass M.D.   On: 04/22/2023 16:37   IR KYPHO LUMBAR INC FX REDUCE BONE BX UNI/BIL CANNULATION INC/IMAGING  Result Date: 04/08/2023 INDICATION: L2 vertebral body metastatic disease, persistent right lower extremity radiculopathy refractory to steroid injections EXAM: 1. L2 vertebral body radiofrequency ablation (OsteoCool) 2. L2 vertebral body augmentation using balloon kyphoplasty COMPARISON:  None Available. MEDICATIONS: Documented in the EMR ANESTHESIA/SEDATION: Moderate (conscious) sedation was employed during this procedure. A total of Versed 4 mg and Fentanyl 200 mcg was administered intravenously by the radiology nurse. Total intra-service moderate Sedation Time: 52 minutes. The patient's level of consciousness and vital signs were monitored continuously by radiology nursing throughout the procedure under my direct supervision. FLUOROSCOPY: Radiation Exposure Index (as provided by the fluoroscopic device): 5.5 minutes (69 mGy) COMPLICATIONS: None immediate. PROCEDURE: Informed written consent was obtained from the patient after a thorough discussion of the procedural risks, benefits and alternatives. All questions were addressed. Maximal Sterile Barrier Technique was utilized including caps, mask, sterile gowns, sterile gloves, sterile drape, hand hygiene and skin  antiseptic. A timeout was performed prior to the initiation of the procedure. The patient was positioned prone on the exam table. A bipedicular access was planned. Skin entry site(s) were marked overlying the L2 vertebral body using fluoroscopy. The overlying skin was then prepped and draped in the standard sterile fashion. Local analgesia was obtained with 1% lidocaine. Attention was first turned to the right side. Under fluoroscopic guidance, a 10 gauge introducer needle was advanced towards the lateral margin of the pedicle. Using multiple projections, the introducer needle was advanced towards the posterior margin of the vertebral body via a transpedicular approach. The posterior margin of the ablation zone was then marked using the inner needle of the introducer. Next, the marking drill was advanced towards the anterior margin of the vertebral body for RF probe selection. The 15 mm RF ablation probe was determined to be appropriate. Attention was then turned to the contralateral side. Under fluoroscopic guidance, a 10 gauge introducer needle was advanced towards the lateral margin of the pedicle. Using multiple projections, the introducer needle was advanced towards the posterior margin of the vertebral body via a transpedicular approach. Again, the posterior margin of the ablation zone was then marked using the inner needle of the introducer. In a similar fashion, core needle biopsy of the vertebral body was performed using a 10 gauge core biopsy device. Small amount of tissue was again retrieved and sent to the lab for analysis. The marking drill was then advanced towards the anterior margin of the vertebral body for RF probe selection. The 15 mm RF ablation probe was determined to be appropriate. The selected RF ablation probes were then advanced through the bilateral transpedicular access needles and locked into position. Appropriate location within the vertebral body was confirmed with multiple  projections. RF ablation was then performed for 11.5 minutes at 70 degrees Celsius. The patient tolerated this portion of the procedure well without significant discomfort. At this time, the acrylic bone cement mixture was reconstituted in  the Kyphon bone mixing device system. This was then loaded onto the Kyphon bone fillers. Upon completion of the RF ablation, the ablation probes were removed. The Kyphon 15 mm inflatable bone tamps were then advanced through the bilateral transpedicular access needles and positioned within the mid vertebral body. Kyphoplasty was then performed, ensuring that the balloon contours stayed within the vertebral body margins. The balloons were then deflated and removed, followed by advancement of the bone filler devices bilaterally and the instillation of acrylic bone cement with excellent filling in the AP and lateral projections. No extravasation was noted in the disk spaces or posteriorly into the spinal canal. No epidural venous contamination was seen. A small amount of cement was seen along the lateral border of the right side of the vertebral body, serving as an endpoint. At the end of the procedure, the introducer cannulas and bone filler devices were then removed without difficulty. Clean dressings were placed after hemostasis. The patient tolerated all aspects of the procedure well, and was transferred to recovery in stable condition. IMPRESSION: 1. Successful L2 vertebral body radiofrequency ablation (OsteoCool) and augmentation using balloon kyphoplasty. If the patient has known osteoporosis, recommend treatment as clinically indicated. If the patient's bone density status is unknown, DEXA scan is recommended. Electronically Signed   By: Olive Bass M.D.   On: 04/08/2023 15:01   IR Bone Tumor(s)RF Ablation  Result Date: 04/08/2023 INDICATION: L2 vertebral body metastatic disease, persistent right lower extremity radiculopathy refractory to steroid injections EXAM: 1.  L2 vertebral body radiofrequency ablation (OsteoCool) 2. L2 vertebral body augmentation using balloon kyphoplasty COMPARISON:  None Available. MEDICATIONS: Documented in the EMR ANESTHESIA/SEDATION: Moderate (conscious) sedation was employed during this procedure. A total of Versed 4 mg and Fentanyl 200 mcg was administered intravenously by the radiology nurse. Total intra-service moderate Sedation Time: 52 minutes. The patient's level of consciousness and vital signs were monitored continuously by radiology nursing throughout the procedure under my direct supervision. FLUOROSCOPY: Radiation Exposure Index (as provided by the fluoroscopic device): 5.5 minutes (69 mGy) COMPLICATIONS: None immediate. PROCEDURE: Informed written consent was obtained from the patient after a thorough discussion of the procedural risks, benefits and alternatives. All questions were addressed. Maximal Sterile Barrier Technique was utilized including caps, mask, sterile gowns, sterile gloves, sterile drape, hand hygiene and skin antiseptic. A timeout was performed prior to the initiation of the procedure. The patient was positioned prone on the exam table. A bipedicular access was planned. Skin entry site(s) were marked overlying the L2 vertebral body using fluoroscopy. The overlying skin was then prepped and draped in the standard sterile fashion. Local analgesia was obtained with 1% lidocaine. Attention was first turned to the right side. Under fluoroscopic guidance, a 10 gauge introducer needle was advanced towards the lateral margin of the pedicle. Using multiple projections, the introducer needle was advanced towards the posterior margin of the vertebral body via a transpedicular approach. The posterior margin of the ablation zone was then marked using the inner needle of the introducer. Next, the marking drill was advanced towards the anterior margin of the vertebral body for RF probe selection. The 15 mm RF ablation probe was  determined to be appropriate. Attention was then turned to the contralateral side. Under fluoroscopic guidance, a 10 gauge introducer needle was advanced towards the lateral margin of the pedicle. Using multiple projections, the introducer needle was advanced towards the posterior margin of the vertebral body via a transpedicular approach. Again, the posterior margin of the ablation zone was  then marked using the inner needle of the introducer. In a similar fashion, core needle biopsy of the vertebral body was performed using a 10 gauge core biopsy device. Small amount of tissue was again retrieved and sent to the lab for analysis. The marking drill was then advanced towards the anterior margin of the vertebral body for RF probe selection. The 15 mm RF ablation probe was determined to be appropriate. The selected RF ablation probes were then advanced through the bilateral transpedicular access needles and locked into position. Appropriate location within the vertebral body was confirmed with multiple projections. RF ablation was then performed for 11.5 minutes at 70 degrees Celsius. The patient tolerated this portion of the procedure well without significant discomfort. At this time, the acrylic bone cement mixture was reconstituted in the Kyphon bone mixing device system. This was then loaded onto the Kyphon bone fillers. Upon completion of the RF ablation, the ablation probes were removed. The Kyphon 15 mm inflatable bone tamps were then advanced through the bilateral transpedicular access needles and positioned within the mid vertebral body. Kyphoplasty was then performed, ensuring that the balloon contours stayed within the vertebral body margins. The balloons were then deflated and removed, followed by advancement of the bone filler devices bilaterally and the instillation of acrylic bone cement with excellent filling in the AP and lateral projections. No extravasation was noted in the disk spaces or  posteriorly into the spinal canal. No epidural venous contamination was seen. A small amount of cement was seen along the lateral border of the right side of the vertebral body, serving as an endpoint. At the end of the procedure, the introducer cannulas and bone filler devices were then removed without difficulty. Clean dressings were placed after hemostasis. The patient tolerated all aspects of the procedure well, and was transferred to recovery in stable condition. IMPRESSION: 1. Successful L2 vertebral body radiofrequency ablation (OsteoCool) and augmentation using balloon kyphoplasty. If the patient has known osteoporosis, recommend treatment as clinically indicated. If the patient's bone density status is unknown, DEXA scan is recommended. Electronically Signed   By: Olive Bass M.D.   On: 04/08/2023 15:01   IR Radiologist Eval & Mgmt  Result Date: 04/02/2023 EXAM: ESTABLISHED PATIENT OFFICE VISIT CHIEF COMPLAINT: Documented in the EMR HISTORY OF PRESENT ILLNESS: When I initially saw the patient on February 11, 2023, the patient had described a right lower extremity radiculopathy, with less discrete back pain symptoms. Given these findings, the patient then underwent 2 selective nerve root blocks/steroid injections of the right L2-L3 and right L3-L4 nerve roots using fluoroscopic guidance. The patient did report moderate improvement of his baseline pain in his right lower extremity radiculopathy, normally 7/10, to 4/10. However, the relief was short-lived, lasting only 2-3 weeks after each injection. Review of the patient's MRI lumbar spine from January 15, 2023, confirmed the presence of a predominantly lytic lesion involving the right half of the L2 vertebral body, with resultant at least moderate compression of the right L2-L3 neural foramen. These findings are compatible with RCC osseous metastatic disease, resulting in a right lower extremity radiculopathy. The patient continues to take oxycodone every 6  hours. His pain remains 6-7/10 using the oxycodone. He does complain of significant constipation while using narcotic pain medication. Given the patient's temporary relief with selective nerve root blocks, I do believe that compression of the nerve root by residual tumor at the L2 vertebral body is the most likely culprit of the patient's pain. However, steroid injections have  provided only short-term relief. An alternative treatment options could include radiofrequency ablation of the tumor, in hopes of reducing tumor burden and relieving the compression on the L2-L3 neural foramen. The risks and benefits of radiofrequency ablation and kyphoplasty (OsteoCool) was discussed with the patient, who expressed interest in this treatment strategy. REVIEW OF SYSTEMS: Documented in the EMR PHYSICAL EXAMINATION: Documented in the EMR ASSESSMENT AND PLAN: Documented in the EMR Electronically Signed   By: Olive Bass M.D.   On: 04/02/2023 15:53

## 2023-04-23 NOTE — Assessment & Plan Note (Signed)
MRI brain w wo contrast Check B12

## 2023-04-24 ENCOUNTER — Other Ambulatory Visit: Payer: Self-pay

## 2023-04-24 DIAGNOSIS — C642 Malignant neoplasm of left kidney, except renal pelvis: Secondary | ICD-10-CM

## 2023-04-25 ENCOUNTER — Inpatient Hospital Stay: Payer: Medicare Other

## 2023-04-25 NOTE — Progress Notes (Signed)
[facility] Clinical Social Work  Clinical Social Work was referred by medical provider for assessment of psychosocial needs.  Clinical Social Worker contacted patient by phone to offer support and assess for needs.    Patient stated he felt much less anxious after his meeting with Dr. Cathie Hoops on 6/24.  He reported that he did not want to have chemotherapy that day because of the side effects.  Since making that decision he has felt better.  He is exploring whether or not he wishes to continue treatment.  CSW encouraged him to speak with Dr. Cathie Hoops.  He also wishes to speak with his daughters about the possibility of stopping treatment since he said it makes him feel so bad.  CSW provided active listening and supportive counseling.     Dorothey Baseman, LCSW  Clinical Social Worker [facility]

## 2023-04-30 ENCOUNTER — Ambulatory Visit: Payer: Medicare Other | Admitting: Occupational Therapy

## 2023-05-02 ENCOUNTER — Encounter
Admission: RE | Admit: 2023-05-02 | Discharge: 2023-05-02 | Disposition: A | Discharge status: Home / Self Care | Payer: Medicare Other | Source: Ambulatory Visit | Attending: Oncology | Admitting: Oncology

## 2023-05-02 DIAGNOSIS — C61 Malignant neoplasm of prostate: Secondary | ICD-10-CM | POA: Insufficient documentation

## 2023-05-02 DIAGNOSIS — C642 Malignant neoplasm of left kidney, except renal pelvis: Secondary | ICD-10-CM | POA: Diagnosis present

## 2023-05-02 DIAGNOSIS — C7951 Secondary malignant neoplasm of bone: Secondary | ICD-10-CM | POA: Diagnosis present

## 2023-05-02 MED ORDER — TECHNETIUM TC 99M MEDRONATE IV KIT
20.0000 | PACK | Freq: Once | INTRAVENOUS | Status: AC | PRN
  Administered 2023-05-02: 20.86 via INTRAVENOUS

## 2023-05-05 ENCOUNTER — Ambulatory Visit
Admission: RE | Admit: 2023-05-05 | Discharge: 2023-05-05 | Disposition: A | Discharge status: Home / Self Care | Payer: Medicare Other | Source: Ambulatory Visit | Attending: Oncology | Admitting: Oncology

## 2023-05-05 DIAGNOSIS — N179 Acute kidney failure, unspecified: Secondary | ICD-10-CM | POA: Diagnosis not present

## 2023-05-05 DIAGNOSIS — C642 Malignant neoplasm of left kidney, except renal pelvis: Secondary | ICD-10-CM | POA: Insufficient documentation

## 2023-05-05 DIAGNOSIS — C7951 Secondary malignant neoplasm of bone: Secondary | ICD-10-CM | POA: Insufficient documentation

## 2023-05-05 MED ORDER — GADOBUTROL 1 MMOL/ML IV SOLN
10.0000 mL | Freq: Once | INTRAVENOUS | Status: AC | PRN
  Administered 2023-05-05: 10 mL via INTRAVENOUS

## 2023-05-06 ENCOUNTER — Telehealth: Payer: Self-pay

## 2023-05-06 NOTE — Telephone Encounter (Signed)
Referral has been faxed to neurosurgery- Dr. Marcell Barlow.   Re: persistent back pain, radiculopathy

## 2023-05-07 ENCOUNTER — Inpatient Hospital Stay: Payer: Medicare Other | Attending: Oncology | Admitting: Occupational Therapy

## 2023-05-07 ENCOUNTER — Encounter: Payer: Self-pay | Admitting: Emergency Medicine

## 2023-05-07 ENCOUNTER — Inpatient Hospital Stay: Payer: Medicare Other

## 2023-05-07 ENCOUNTER — Other Ambulatory Visit: Payer: Self-pay

## 2023-05-07 ENCOUNTER — Inpatient Hospital Stay
Admission: EM | Admit: 2023-05-07 | Discharge: 2023-05-11 | DRG: 683 | Disposition: A | Discharge status: Home Health | Payer: Medicare Other | Attending: Internal Medicine | Admitting: Internal Medicine

## 2023-05-07 DIAGNOSIS — C801 Malignant (primary) neoplasm, unspecified: Secondary | ICD-10-CM | POA: Diagnosis not present

## 2023-05-07 DIAGNOSIS — E1169 Type 2 diabetes mellitus with other specified complication: Secondary | ICD-10-CM | POA: Diagnosis present

## 2023-05-07 DIAGNOSIS — C642 Malignant neoplasm of left kidney, except renal pelvis: Secondary | ICD-10-CM | POA: Diagnosis present

## 2023-05-07 DIAGNOSIS — N179 Acute kidney failure, unspecified: Principal | ICD-10-CM | POA: Diagnosis present

## 2023-05-07 DIAGNOSIS — D631 Anemia in chronic kidney disease: Secondary | ICD-10-CM | POA: Diagnosis present

## 2023-05-07 DIAGNOSIS — Z8546 Personal history of malignant neoplasm of prostate: Secondary | ICD-10-CM

## 2023-05-07 DIAGNOSIS — N1831 Chronic kidney disease, stage 3a: Secondary | ICD-10-CM | POA: Diagnosis present

## 2023-05-07 DIAGNOSIS — Z905 Acquired absence of kidney: Secondary | ICD-10-CM | POA: Diagnosis not present

## 2023-05-07 DIAGNOSIS — E782 Mixed hyperlipidemia: Secondary | ICD-10-CM | POA: Diagnosis present

## 2023-05-07 DIAGNOSIS — G4733 Obstructive sleep apnea (adult) (pediatric): Secondary | ICD-10-CM | POA: Diagnosis present

## 2023-05-07 DIAGNOSIS — I10 Essential (primary) hypertension: Secondary | ICD-10-CM | POA: Diagnosis not present

## 2023-05-07 DIAGNOSIS — C7951 Secondary malignant neoplasm of bone: Secondary | ICD-10-CM | POA: Diagnosis present

## 2023-05-07 DIAGNOSIS — R197 Diarrhea, unspecified: Secondary | ICD-10-CM | POA: Diagnosis present

## 2023-05-07 DIAGNOSIS — E1122 Type 2 diabetes mellitus with diabetic chronic kidney disease: Secondary | ICD-10-CM | POA: Diagnosis present

## 2023-05-07 DIAGNOSIS — Z9852 Vasectomy status: Secondary | ICD-10-CM

## 2023-05-07 DIAGNOSIS — T451X5A Adverse effect of antineoplastic and immunosuppressive drugs, initial encounter: Secondary | ICD-10-CM | POA: Diagnosis present

## 2023-05-07 DIAGNOSIS — I129 Hypertensive chronic kidney disease with stage 1 through stage 4 chronic kidney disease, or unspecified chronic kidney disease: Secondary | ICD-10-CM | POA: Diagnosis present

## 2023-05-07 DIAGNOSIS — E785 Hyperlipidemia, unspecified: Secondary | ICD-10-CM | POA: Diagnosis present

## 2023-05-07 DIAGNOSIS — F411 Generalized anxiety disorder: Secondary | ICD-10-CM | POA: Diagnosis not present

## 2023-05-07 DIAGNOSIS — Z79899 Other long term (current) drug therapy: Secondary | ICD-10-CM | POA: Diagnosis not present

## 2023-05-07 DIAGNOSIS — Z9079 Acquired absence of other genital organ(s): Secondary | ICD-10-CM

## 2023-05-07 DIAGNOSIS — Z8249 Family history of ischemic heart disease and other diseases of the circulatory system: Secondary | ICD-10-CM | POA: Diagnosis not present

## 2023-05-07 DIAGNOSIS — E871 Hypo-osmolality and hyponatremia: Secondary | ICD-10-CM | POA: Diagnosis present

## 2023-05-07 DIAGNOSIS — N2581 Secondary hyperparathyroidism of renal origin: Secondary | ICD-10-CM | POA: Diagnosis present

## 2023-05-07 DIAGNOSIS — Z85828 Personal history of other malignant neoplasm of skin: Secondary | ICD-10-CM

## 2023-05-07 DIAGNOSIS — E860 Dehydration: Secondary | ICD-10-CM | POA: Diagnosis present

## 2023-05-07 DIAGNOSIS — F064 Anxiety disorder due to known physiological condition: Secondary | ICD-10-CM | POA: Diagnosis present

## 2023-05-07 DIAGNOSIS — R413 Other amnesia: Secondary | ICD-10-CM | POA: Diagnosis present

## 2023-05-07 LAB — CBC WITH DIFFERENTIAL/PLATELET
Abs Immature Granulocytes: 0.11 10*3/uL — ABNORMAL HIGH (ref 0.00–0.07)
Basophils Absolute: 0.1 10*3/uL (ref 0.0–0.1)
Basophils Relative: 1 %
Eosinophils Absolute: 1 10*3/uL — ABNORMAL HIGH (ref 0.0–0.5)
Eosinophils Relative: 10 %
HCT: 36.2 % — ABNORMAL LOW (ref 39.0–52.0)
Hemoglobin: 12.1 g/dL — ABNORMAL LOW (ref 13.0–17.0)
Immature Granulocytes: 1 %
Lymphocytes Relative: 7 %
Lymphs Abs: 0.7 10*3/uL (ref 0.7–4.0)
MCH: 28.7 pg (ref 26.0–34.0)
MCHC: 33.4 g/dL (ref 30.0–36.0)
MCV: 86 fL (ref 80.0–100.0)
Monocytes Absolute: 0.9 10*3/uL (ref 0.1–1.0)
Monocytes Relative: 10 %
Neutro Abs: 6.9 10*3/uL (ref 1.7–7.7)
Neutrophils Relative %: 71 %
Platelets: 240 10*3/uL (ref 150–400)
RBC: 4.21 MIL/uL — ABNORMAL LOW (ref 4.22–5.81)
RDW: 13.4 % (ref 11.5–15.5)
WBC: 9.6 10*3/uL (ref 4.0–10.5)
nRBC: 0 % (ref 0.0–0.2)

## 2023-05-07 LAB — COMPREHENSIVE METABOLIC PANEL
ALT: 17 U/L (ref 0–44)
AST: 17 U/L (ref 15–41)
Albumin: 3 g/dL — ABNORMAL LOW (ref 3.5–5.0)
Alkaline Phosphatase: 55 U/L (ref 38–126)
Anion gap: 13 (ref 5–15)
BUN: 54 mg/dL — ABNORMAL HIGH (ref 8–23)
CO2: 17 mmol/L — ABNORMAL LOW (ref 22–32)
Calcium: 6.9 mg/dL — ABNORMAL LOW (ref 8.9–10.3)
Chloride: 102 mmol/L (ref 98–111)
Creatinine, Ser: 4.01 mg/dL — ABNORMAL HIGH (ref 0.61–1.24)
GFR, Estimated: 15 mL/min — ABNORMAL LOW (ref >60)
Glucose, Bld: 139 mg/dL — ABNORMAL HIGH (ref 70–99)
Potassium: 3.9 mmol/L (ref 3.5–5.1)
Sodium: 132 mmol/L — ABNORMAL LOW (ref 135–145)
Total Bilirubin: 0.7 mg/dL (ref 0.3–1.2)
Total Protein: 6 g/dL — ABNORMAL LOW (ref 6.5–8.1)

## 2023-05-07 LAB — URINALYSIS, W/ REFLEX TO CULTURE (INFECTION SUSPECTED)
Bacteria, UA: NONE SEEN
Bilirubin Urine: NEGATIVE
Glucose, UA: NEGATIVE mg/dL
Ketones, ur: NEGATIVE mg/dL
Leukocytes,Ua: NEGATIVE
Nitrite: NEGATIVE
Protein, ur: NEGATIVE mg/dL
Specific Gravity, Urine: 1.011 (ref 1.005–1.030)
pH: 6 (ref 5.0–8.0)

## 2023-05-07 LAB — MAGNESIUM: Magnesium: 2.1 mg/dL (ref 1.7–2.4)

## 2023-05-07 MED ORDER — ACETAMINOPHEN 650 MG RE SUPP: 650.0000 mg | Freq: Four times a day (QID) | RECTAL | Status: DC | PRN

## 2023-05-07 MED ORDER — SODIUM CHLORIDE 0.9 % IV SOLN: Freq: Once | INTRAVENOUS | Status: AC

## 2023-05-07 MED ORDER — EZETIMIBE 10 MG PO TABS
10.0000 mg | ORAL_TABLET | Freq: Every day | ORAL | Status: DC
  Administered 2023-05-08 – 2023-05-10 (×3): 10 mg via ORAL
  Filled 2023-05-07 (×3): qty 1

## 2023-05-07 MED ORDER — ACETAMINOPHEN 325 MG PO TABS: 650.0000 mg | ORAL_TABLET | Freq: Four times a day (QID) | ORAL | Status: DC | PRN

## 2023-05-07 MED ORDER — OXYCODONE ER 9 MG PO C12A
9.0000 mg | EXTENDED_RELEASE_CAPSULE | Freq: Two times a day (BID) | ORAL | Status: DC
  Administered 2023-05-07 – 2023-05-11 (×8): 9 mg via ORAL
  Filled 2023-05-07 (×8): qty 100

## 2023-05-07 MED ORDER — OXYCODONE HCL 5 MG PO TABS
5.0000 mg | ORAL_TABLET | Freq: Four times a day (QID) | ORAL | Status: DC | PRN
  Administered 2023-05-11: 5 mg via ORAL
  Filled 2023-05-07 (×2): qty 1

## 2023-05-07 MED ORDER — ONDANSETRON HCL 4 MG PO TABS: 4.0000 mg | ORAL_TABLET | Freq: Four times a day (QID) | ORAL | Status: DC | PRN

## 2023-05-07 MED ORDER — GABAPENTIN 300 MG PO CAPS
600.0000 mg | ORAL_CAPSULE | Freq: Every day | ORAL | Status: DC
  Administered 2023-05-07 – 2023-05-10 (×4): 600 mg via ORAL
  Filled 2023-05-07 (×4): qty 2

## 2023-05-07 MED ORDER — MELATONIN 5 MG PO TABS: 5.0000 mg | ORAL_TABLET | Freq: Every evening | ORAL | Status: DC | PRN

## 2023-05-07 MED ORDER — HYDRALAZINE HCL 20 MG/ML IJ SOLN: 5.0000 mg | Freq: Three times a day (TID) | INTRAMUSCULAR | Status: DC | PRN

## 2023-05-07 MED ORDER — ONDANSETRON HCL 4 MG/2ML IJ SOLN: 4.0000 mg | Freq: Four times a day (QID) | INTRAMUSCULAR | Status: DC | PRN

## 2023-05-07 MED ORDER — FLUTICASONE PROPIONATE 50 MCG/ACT NA SUSP: 2.0000 | Freq: Every day | NASAL | Status: DC | PRN

## 2023-05-07 MED ORDER — SODIUM CHLORIDE 0.9 % IV SOLN: INTRAVENOUS | Status: AC

## 2023-05-07 MED ORDER — POLYETHYLENE GLYCOL 3350 17 G PO PACK
17.0000 g | PACK | Freq: Every day | ORAL | Status: DC
  Administered 2023-05-08 – 2023-05-11 (×4): 17 g via ORAL
  Filled 2023-05-07 (×4): qty 1

## 2023-05-07 MED ORDER — HEPARIN SODIUM (PORCINE) 5000 UNIT/ML IJ SOLN
5000.0000 [IU] | Freq: Three times a day (TID) | INTRAMUSCULAR | Status: DC
  Administered 2023-05-07 – 2023-05-11 (×11): 5000 [IU] via SUBCUTANEOUS
  Filled 2023-05-07 (×11): qty 1

## 2023-05-07 NOTE — H&P (Addendum)
History and Physical   Peter Stuart ZOX:[id number] DOB: 19-Feb-1946 DOA: 05/07/2023  PCP: Marisue Ivan, MD  Outpatient Specialists: Dr. Cherylann Ratel Patient coming from: Home via POV  I have personally briefly reviewed patient's old medical records in [facility] EMR.  Chief Concern: Fatigue, weakness, acute kidney injury at outpatient nephrology clinic  HPI: Mr. 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  Vitals in the ED showed respiration rate of 20, heart rate of 83, blood pressure 120/90, SpO2 of 96% on room air.  Serum sodium is 132, potassium 3.9, chloride 102, bicarb 17, BUN of 54, serum creatinine of 4.01, EGFR 15, nonfasting blood glucose 139, WBC 9.6, hemoglobin 12.1, platelets of 240.  UA ordered and pending collection.  ED treatment: Sodium chloride infusion at 75 mL/h.  Ultrasound of the kidneys have been ordered and pending completion. ----------------------------- At bedside, he is able to tell me his name, age, current location, current calendar year.  He reports he developed weakness and fatigue today while in nephrology clinic.  He denies trauma to his person.  He denies changes to urine output though today, he has not urinated much. He denies dysuria, hematuria. He endorses decreased urine output today only.  He endorses on/off diarrhea over the last 6 weeks. He had one episode last week.  He denies blood in his diarrhea.  He states the diarrhea is not black in color.  He endorses poor PO intake, however he has water next to him and drinks about 50-74 oz of water per day.  He denies swelling of his legs and/or shortness of breath. He denies chest pain.  Social history: He lives on his  own. He denies tobacco, etoh, and recreational drug use. He is retired and formerly was a Theatre manager.   ROS: Constitutional: no weight change, no fever ENT/Mouth: no sore throat, no rhinorrhea Eyes: no eye pain, no vision changes Cardiovascular: no chest pain, no dyspnea,  no edema, no palpitations Respiratory: no cough, no sputum, no wheezing Gastrointestinal: no nausea, no vomiting, no diarrhea, no constipation Genitourinary: no urinary incontinence, no dysuria, no hematuria, + decreased urination. Musculoskeletal: no arthralgias, no myalgias Skin: no skin lesions, no pruritus, Neuro: + weakness, + fatigue, no loss of consciousness, no syncope Psych: no anxiety, no depression, + decreased appetite Heme/Lymph: no bruising, no bleeding  ED Course: Discussed with emergency medicine provider, patient requiring hospitalization for chief concerns of acute kidney injury.  Assessment/Plan  Principal Problem:   AKI (acute kidney injury) (HCC) Active Problems:   Essential hypertension   OSA on CPAP   History of squamous cell carcinoma of skin   Memory loss   Mixed hyperlipidemia   Type 2 diabetes mellitus with hyperlipidemia (HCC)   Cancer of kidney, left (HCC)   History of left nephrectomy   Metastasis to bone (HCC)   Anxiety associated with cancer diagnosis (HCC)   Assessment and Plan:  * AKI (acute kidney injury) (HCC) With baseline CKD 3A I suspect this is intrarenal given patient denying GI loss and or prerenal causes.  Ultrasound of the renal was negative for post obstruction.  And patient has been drinking at least 50-75 ounces of water per day Home lisinopril and hydrochlorothiazide not resumed on admission Sodium chloride 75 mL/h, 1 day  ordered Ultrasound of the renal ordered by EDP pending completion BMP recheck in the a.m.  Anxiety associated with cancer diagnosis [facility]) Patient is prescribed lorazepam 0.5 mg every 12 hours for anxiety however per daughter he has  not required this medication  Metastasis to bone [facility]) With right lower extremity pain Home xtampza 9 mg scheduled daily, 9:00 hrs and 2100 hrs., nonformulary order and pharmacy consultation for patient to bring in his own medication placed Home as needed oxycodone 5 mg every 6 hours as needed for severe pain resumed  OSA on CPAP CPAP nightly ordered, however patient states that he brought his own and would like to use his own  Essential hypertension Hydralazine 5 mg IV every 8 hours as needed for SBP greater 170, 4 days ordered  Chart reviewed.   DVT prophylaxis: Heparin 5000 units subcutaneous every 8 hours Code Status: Full code, I confirmed with patient that he would like to be full code on this hospitalization Diet: Heart healthy Family Communication: Updated patient's daughter, Angelica Chessman at bedside with patient's permission Disposition Plan: Pending clinical course Consults called: Nephrology has been consulted via staff message to Dr. Cherylann Ratel Admission status: MedSurg, inpatient  Past Medical History:  Diagnosis Date   Anemia    Cancer (HCC)    Cancer of kidney (HCC)    CKD (chronic kidney disease) stage 3, GFR 30-59 ml/min (HCC)    Complication of anesthesia    slow to wake after 1 surgery   CPAP use counseling    Diabetes mellitus without complication (HCC)    HLD (hyperlipidemia)    HTN (hypertension)    OSA on CPAP    PONV (postoperative nausea and vomiting)    Prostate cancer (HCC)    Seasonal allergies    Skin cancer    Past Surgical History:  Procedure Laterality Date   FEMUR IM NAIL Left 11/26/2022   Procedure: LEFT INTRAMEDULLARY (IM) NAIL FEMORAL;  Surgeon: Lyndle Herrlich, MD;  Location: [facility] ORS;  Service: Orthopedics;  Laterality: Left;   HERNIA REPAIR     IR BONE TUMOR(S)RF ABLATION  04/08/2023   IR FLUORO GUIDED NEEDLE PLC ASPIRATION/INJECTION LOC  07/02/2022   IR KYPHO LUMBAR INC FX REDUCE BONE BX UNI/BIL CANNULATION INC/IMAGING  04/08/2023   IR  RADIOLOGIST EVAL & MGMT  02/11/2023   IR RADIOLOGIST EVAL & MGMT  03/11/2023   IR RADIOLOGIST EVAL & MGMT  04/02/2023   IR RADIOLOGIST EVAL & MGMT  04/22/2023   LAPAROSCOPIC APPENDECTOMY N/A 04/27/2021   Procedure: APPENDECTOMY LAPAROSCOPIC;  Surgeon: Duanne Guess, MD;  Location: [facility] ORS;  Service: General;  Laterality: N/A;   LAPAROSCOPIC NEPHRECTOMY, HAND ASSISTED Left 06/25/2021   Procedure: HAND ASSISTED LAPAROSCOPIC NEPHRECTOMY;  Surgeon: Sondra Come, MD;  Location: [facility] ORS;  Service: Urology;  Laterality: Left;   prostatectomy     REPAIR KNEE LIGAMENT     SHOULDER ARTHROSCOPY WITH SUBACROMIAL DECOMPRESSION AND OPEN ROTATOR C Right 12/29/2020   Procedure: Right shoulder arthroscopic rotator cuff repair, subacromial decompression, and biceps tenodesis;  Surgeon: Signa Kell, MD;  Location: [facility];  Service: Orthopedics;  Laterality: Right;   TONSILLECTOMY     VASECTOMY     Social History:  reports that he has never smoked. He has never been exposed to tobacco smoke. He has never used smokeless tobacco. He reports that he does not currently use alcohol. He reports that he does not use drugs.  Allergies  Allergen Reactions   Robaxin [Methocarbamol]     ?  hives   Tramadol Hives    Possible hives   Family History  Problem Relation Age of Onset   Stroke Mother    Hypertension Father    Emphysema Father    Family history: Family history reviewed and not pertinent.  Prior to Admission medications   Medication Sig Start Date End Date Taking? Authorizing Provider  ALLEGRA ALLERGY 60 MG tablet Take 60 mg by mouth daily. Patient not taking: Reported on 04/23/2023 01/13/20   [provider]  cholecalciferol (VITAMIN D3) 25 MCG (1000 UNIT) tablet Take 1,000 Units by mouth daily. Patient not taking: Reported on 04/23/2023    [provider]  clotrimazole (LOTRIMIN) 1 % cream Apply topically 2 (two) times daily. Patient not taking: Reported on 04/23/2023     [provider]  Coenzyme Q10 10 MG capsule Take 10 mg by mouth daily. Patient not taking: Reported on 04/23/2023    [provider]  diphenhydrAMINE-Zinc Acetate (BENADRYL ITCH RELIEF EX) Apply topically. Patient not taking: Reported on 04/23/2023    [provider]  ezetimibe (ZETIA) 10 MG tablet Take 1 tablet by mouth daily. Patient not taking: Reported on 04/23/2023 11/01/21   [provider]  fluticasone (FLONASE) 50 MCG/ACT nasal spray Place 2 sprays into both nostrils daily. 01/22/21   [provider]  gabapentin (NEURONTIN) 600 MG tablet Take 600 mg by mouth at bedtime.    [provider]  Neila Gear COMPLEX PO Take 1 tablet by mouth daily. Patient not taking: Reported on 04/23/2023    [provider]  hydrochlorothiazide (HYDRODIURIL) 25 MG tablet Take 1 tablet by mouth daily. Patient not taking: Reported on 04/23/2023 05/14/22   [provider]  LORazepam (ATIVAN) 0.5 MG tablet Take 1 tablet (0.5 mg total) by mouth every 12 (twelve) hours as needed for anxiety. 04/23/23   Rickard Patience, MD  losartan (COZAAR) 100 MG tablet Take 100 mg by mouth daily. Patient not taking: Reported on 04/23/2023 05/30/20   [provider]  megestrol (MEGACE) 40 MG tablet Take 1 tablet (40 mg total) by mouth daily. Patient not taking: Reported on 04/23/2023 04/02/23   Rickard Patience, MD  Multiple Vitamin (MULTIVITAMIN) capsule Take 1 capsule by mouth daily. Patient not taking: Reported on 04/23/2023    [provider]  multivitamin-lutein [facility]) CAPS capsule Take 1 capsule by mouth daily. Patient not taking: Reported on 04/23/2023    [provider]  oxyCODONE (OXY IR/ROXICODONE) 5 MG immediate release tablet Take 1 tablet (5 mg total) by mouth every 6 (six) hours as needed for severe pain. 04/02/23   Rickard Patience, MD  pantoprazole (PROTONIX) 40 MG tablet Take 1 tablet (40 mg total) by mouth daily. Patient not taking: Reported  on 04/23/2023 03/11/23   Salena Saner, MD  Pembrolizumab [facility] IV) Inject into the vein.    [provider]  polyethylene glycol (MIRALAX / GLYCOLAX) 17 g packet Take 17 g by mouth daily.    [provider]  PREVIDENT 5000 BOOSTER PLUS 1.1 % PSTE Take by mouth. Patient not taking: Reported on 04/23/2023 03/20/23   [provider]  triamcinolone cream (KENALOG) 0.1 % Apply 1 Application topically 2 (two) times daily. Patient not taking: Reported on 04/23/2023 09/25/22   [provider]   Physical Exam: Vitals:   05/07/23 1521 05/07/23 1522 05/07/23 1600 05/07/23 1910  BP:  (!) 120/90  119/80  Pulse:  83  72  Resp:  20  15  Temp:  98.5 F (36.9 C)  TempSrc:    Oral  SpO2:  96%  96%  Weight: 88.5 kg     Height:   5\' 9"  (1.753 m)    Constitutional: appears frail, NAD, calm Eyes: PERRL, lids and conjunctivae normal ENMT: Mucous membranes are moist. Posterior pharynx clear of any exudate or lesions. Age-appropriate dentition. Hearing appropriate Neck: normal, supple, no masses, no thyromegaly Respiratory: clear to auscultation bilaterally, no wheezing, no crackles. Normal respiratory effort. No accessory muscle use.  Cardiovascular: Regular rate and rhythm, no murmurs / rubs / gallops. No extremity edema. 2+ pedal pulses. No carotid bruits.  Abdomen: Obese abdomen, no tenderness, no masses palpated, no hepatosplenomegaly. Bowel sounds positive.  Musculoskeletal: no clubbing / cyanosis. No joint deformity upper and lower extremities. Good ROM, no contractures, no atrophy. Normal muscle tone.  Skin: no rashes, lesions, ulcers. No induration Neurologic: Sensation intact. Strength 5/5 in all 4.  Psychiatric: Normal judgment and insight. Alert and oriented x 3. Normal mood.   EKG: Ordered  Chest x-ray on Admission: Not indicated at this time  US Renal  Result Date: 05/07/2023 CLINICAL DATA:  Acute on chronic kidney disease EXAM: RENAL / URINARY  TRACT ULTRASOUND COMPLETE COMPARISON:  CT scan 02/12/2023 FINDINGS: Right Kidney: Renal measurements: 13.3 by 5.1 by 5.5 cm = volume: 193 mL. Echogenicity within normal limits. No mass or hydronephrosis visualized. Left Kidney: Surgically absent Bladder: Appears normal for degree of bladder distention. Other: None. IMPRESSION: 1. No right-sided hydronephrosis. 2. Left nephrectomy. Electronically Signed   By: Gaylyn Rong M.D.   On: 05/07/2023 18:22    Labs on Admission: I have personally reviewed following labs  CBC: Recent Labs  Lab 05/07/23 1524  WBC 9.6  NEUTROABS 6.9  HGB 12.1*  HCT 36.2*  MCV 86.0  PLT 240   Basic Metabolic Panel: Recent Labs  Lab 05/07/23 1524  NA 132*  K 3.9  CL 102  CO2 17*  GLUCOSE 139*  BUN 54*  CREATININE 4.01*  CALCIUM 6.9*  MG 2.1   GFR: Estimated Creatinine Clearance: 16.7 mL/min (A) (by C-G formula based on SCr of 4.01 mg/dL (H)).  Liver Function Tests: Recent Labs  Lab 05/07/23 1524  AST 17  ALT 17  ALKPHOS 55  BILITOT 0.7  PROT 6.0*  ALBUMIN 3.0*   Urine analysis:    Component Value Date/Time   COLORURINE YELLOW (A) 05/07/2023 1850   APPEARANCEUR CLEAR (A) 05/07/2023 1850   LABSPEC 1.011 05/07/2023 1850   PHURINE 6.0 05/07/2023 1850   GLUCOSEU NEGATIVE 05/07/2023 1850   HGBUR SMALL (A) 05/07/2023 1850   BILIRUBINUR NEGATIVE 05/07/2023 1850   KETONESUR NEGATIVE 05/07/2023 1850   PROTEINUR NEGATIVE 05/07/2023 1850   NITRITE NEGATIVE 05/07/2023 1850   LEUKOCYTESUR NEGATIVE 05/07/2023 1850   This document was prepared using Dragon Voice Recognition software and may include unintentional dictation errors.  Dr. Sedalia Muta [facility]  If 7PM-7AM, please contact overnight-coverage provider If 7AM-7PM, please contact day attending provider [email or web address]  05/07/2023, 7:22 PM

## 2023-05-07 NOTE — ED Provider Notes (Signed)
[facility] Provider Note    Event Date/Time   First MD Initiated Contact with Patient 05/07/23 1532     (approximate)   History   Abnormal Lab   HPI  Peter Stuart is a 77 y.o. male   Past medical history of status post nephrectomy, CKD, hypertension hyperlipidemia, who presents emergency department with worsening kidney function sent in from his nephrology Dr. Cherylann Ratel office.  He states generalized weakness and fatigue over the last several weeks but denies any other acute medical complaints.  He denies dysuria and is still making urine.  He denies abdominal pain back pain.  He has been compliant with his medications.  Independent Historian contributed to assessment above: His daughter is at bedside to corroborate information past medical history as above.       Physical Exam   Triage Vital Signs: ED Triage Vitals  Enc Vitals Group     BP 05/07/23 1522 (!) 120/90     Pulse Rate 05/07/23 1522 83     Resp 05/07/23 1522 20     Temp --      Temp src --      SpO2 05/07/23 1522 96 %     Weight 05/07/23 1521 195 lb (88.5 kg)     Height --      Head Circumference --      Peak Flow --      Pain Score 05/07/23 1519 0     Pain Loc --      Pain Edu? --      Excl. in GC? --     Most recent vital signs: Vitals:   05/07/23 1522  BP: (!) 120/90  Pulse: 83  Resp: 20  SpO2: 96%    General: Awake, no distress.  CV:  Good peripheral perfusion. Resp:  Normal effort.  Abd:  No distention.  Other:  Comfortable.  Normotensive vital signs within normal limits, appears euvolemic, soft nontender abdomen and mentating normally.   ED Results / Procedures / Treatments   Labs (all labs ordered are listed, but only abnormal results are displayed) Labs Reviewed  COMPREHENSIVE METABOLIC PANEL - Abnormal; Notable for the following components:      Result Value   Sodium 132 (*)    CO2 17 (*)    Glucose, Bld 139 (*)    BUN 54 (*)    Creatinine, Ser 4.01  (*)    Calcium 6.9 (*)    Total Protein 6.0 (*)    Albumin 3.0 (*)    GFR, Estimated 15 (*)    All other components within normal limits  CBC WITH DIFFERENTIAL/PLATELET - Abnormal; Notable for the following components:   RBC 4.21 (*)    Hemoglobin 12.1 (*)    HCT 36.2 (*)    Eosinophils Absolute 1.0 (*)    Abs Immature Granulocytes 0.11 (*)    All other components within normal limits  MAGNESIUM  URINALYSIS, W/ REFLEX TO CULTURE (INFECTION SUSPECTED)     I ordered and reviewed the above labs they are notable for his creatinine is 4 from baseline the ones just a couple weeks ago   PROCEDURES:  Critical Care performed: Yes, see critical care procedure note(s)  .Critical Care  Performed by: Pilar Jarvis, MD Authorized by: Pilar Jarvis, MD   Critical care provider statement:    Critical care time (minutes):  30   Critical care was time spent personally by me on the following activities:  Development of treatment plan  with patient or surrogate, discussions with consultants, evaluation of patient's response to treatment, examination of patient, ordering and review of laboratory studies, ordering and review of radiographic studies, ordering and performing treatments and interventions, pulse oximetry, re-evaluation of patient's condition and review of old charts    MEDICATIONS ORDERED IN ED: Medications  0.9 %  sodium chloride infusion (has no administration in time range)    External physician / consultants:  I spoke with Dr. Cherylann Ratel of nephrology regarding care plan for this patient.   IMPRESSION / MDM / ASSESSMENT AND PLAN / ED COURSE  I reviewed the triage vital signs and the nursing notes.                                Patient's presentation is most consistent with acute presentation with potential threat to life or bodily function.  Differential diagnosis includes, but is not limited to, kidney failure, obstructive uropathy, urinary tract infection, electrolyte  derangements   The patient is on the cardiac monitor to evaluate for evidence of arrhythmia and/or significant heart rate changes.  MDM: This is a patient with acutely worsening kidney function solitary kidney concern for kidney failure.  Coordinated with Dr. Lourdes Sledge of nephrology who recommends stat renal ultrasound now and started on normal saline infusion at 75 cc/h.  Repeat kidney function testing, check electrolytes and admission to hospital.       FINAL CLINICAL IMPRESSION(S) / ED DIAGNOSES   Final diagnoses:  Acute renal failure, unspecified acute renal failure type (HCC)     Rx / DC Orders   ED Discharge Orders     None        Note:  This document was prepared using Dragon voice recognition software and may include unintentional dictation errors.    Pilar Jarvis, MD 05/07/23 [phone]

## 2023-05-07 NOTE — Hospital Course (Addendum)
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  Vitals in the ED showed respiration rate of 20, heart rate of 83, blood pressure 120/90, SpO2 of 96% on room air.  Serum sodium is 132, potassium 3.9, chloride 102, bicarb 17, BUN of 54, serum creatinine of 4.01, EGFR 15, nonfasting blood glucose 139, WBC 9.6, hemoglobin 12.1, platelets of 240.  UA ordered and pending collection.  ED treatment: Sodium chloride infusion at 75 mL/h.  Ultrasound of the kidneys have been ordered and pending completion.

## 2023-05-07 NOTE — Assessment & Plan Note (Signed)
With right lower extremity pain Home xtampza 9 mg scheduled daily, 9:00 hrs and 2100 hrs., nonformulary order and pharmacy consultation for patient to bring in his own medication placed Home as needed oxycodone 5 mg every 6 hours as needed for severe pain resumed

## 2023-05-07 NOTE — Assessment & Plan Note (Signed)
Hydralazine 5 mg IV every 8 hours as needed for SBP greater 170, 4 days ordered

## 2023-05-07 NOTE — ED Triage Notes (Signed)
Pt via POV from home. Pt was sent from Dr. Garnett Farm office for abnormal kidney function. Pt has a hx of CKD but states were worse today. Denies any pain. Pt is A&OX4 and NAD

## 2023-05-07 NOTE — Progress Notes (Signed)
Chap responded to a page. Peter Stuart in the room and a nurse administering medication. Chaplain introduced Spiritual and emotional support to Pt. Peter Stuart will circle back or refer to incoming chap as Pt is right now being helped to change his clothe.   05/07/23 2100  Spiritual Encounters  Type of Visit Initial  Care provided to: Patient  Conversation partners present during encounter Nurse  Referral source Nurse (RN/NT/LPN)  Reason for visit Routine spiritual support  OnCall Visit Yes  Interventions  Spiritual Care Interventions Made Established relationship of care and support  Intervention Outcomes  Outcomes Connection to spiritual care;Awareness of support

## 2023-05-07 NOTE — Assessment & Plan Note (Addendum)
With baseline CKD 3A I suspect this is intrarenal given patient denying GI loss and or prerenal causes.  Ultrasound of the renal was negative for post obstruction.  And patient has been drinking at least 50-75 ounces of water per day Home lisinopril and hydrochlorothiazide not resumed on admission Sodium chloride 75 mL/h, 1 day ordered Ultrasound of the renal ordered by EDP pending completion BMP recheck in the a.m.

## 2023-05-07 NOTE — Therapy (Signed)
[facility] [address], [address] [place], [place], [address] Phone: [phone]   Fax:  [phone]  Occupational Therapy Screen  Patient Details  Name: Peter Stuart MRN: [id number] Date of Birth: Jan 11, 1946 No data recorded  Encounter Date: 05/07/2023   OT End of Session - 05/07/23 1322     Visit Number 0             Past Medical History:  Diagnosis Date   Anemia    Cancer (HCC)    Cancer of kidney (HCC)    CKD (chronic kidney disease) stage 3, GFR 30-59 ml/min (HCC)    Complication of anesthesia    slow to wake after 1 surgery   CPAP use counseling    Diabetes mellitus without complication (HCC)    HLD (hyperlipidemia)    HTN (hypertension)    OSA on CPAP    PONV (postoperative nausea and vomiting)    Prostate cancer (HCC)    Seasonal allergies    Skin cancer     Past Surgical History:  Procedure Laterality Date   FEMUR IM NAIL Left 11/26/2022   Procedure: LEFT INTRAMEDULLARY (IM) NAIL FEMORAL;  Surgeon: Lyndle Herrlich, MD;  Location: [facility] ORS;  Service: Orthopedics;  Laterality: Left;   HERNIA REPAIR     IR BONE TUMOR(S)RF ABLATION  04/08/2023   IR FLUORO GUIDED NEEDLE PLC ASPIRATION/INJECTION LOC  07/02/2022   IR KYPHO LUMBAR INC FX REDUCE BONE BX UNI/BIL CANNULATION INC/IMAGING  04/08/2023   IR RADIOLOGIST EVAL & MGMT  02/11/2023   IR RADIOLOGIST EVAL & MGMT  03/11/2023   IR RADIOLOGIST EVAL & MGMT  04/02/2023   IR RADIOLOGIST EVAL & MGMT  04/22/2023   LAPAROSCOPIC APPENDECTOMY N/A 04/27/2021   Procedure: APPENDECTOMY LAPAROSCOPIC;  Surgeon: Duanne Guess, MD;  Location: [facility] ORS;  Service: General;  Laterality: N/A;   LAPAROSCOPIC NEPHRECTOMY, HAND ASSISTED Left 06/25/2021   Procedure: HAND ASSISTED LAPAROSCOPIC NEPHRECTOMY;  Surgeon: Sondra Come, MD;  Location: [facility] ORS;  Service: Urology;  Laterality: Left;   prostatectomy     REPAIR KNEE LIGAMENT     SHOULDER ARTHROSCOPY WITH SUBACROMIAL  DECOMPRESSION AND OPEN ROTATOR C Right 12/29/2020   Procedure: Right shoulder arthroscopic rotator cuff repair, subacromial decompression, and biceps tenodesis;  Surgeon: Signa Kell, MD;  Location: [facility];  Service: Orthopedics;  Laterality: Right;   TONSILLECTOMY     VASECTOMY      There were no vitals filed for this visit.   Subjective Assessment - 05/07/23 1320     Subjective  My L hip doing okay but my lower back pain down to my knee bothers me more- having PT 2 x wk - but most of the exercises doing in my lift chair-  standing and sitting exercises bothers my back    Currently in Pain? Yes    Pain Score 4     Pain Location Back    Pain Orientation Lower    Pain Descriptors / Indicators Aching;Sharp;Shooting                DR YU note 04/23/23: S/p  SBRT to L2 and left femur.  Follow up with orthopedic surgeon - s/p Left IM nail femur surgery.  right ischium lesion-- discussed with Dr. Juliette Alcide, he does not think his symptoms  are directly from bone pain related to his metastatic disease, and therefore RFA is less likely to be helpful.  Right L2-L3 neuroforamen, s/p Right L2/L3  Nerve Root Block by IR 03/11/23 S/p right L3-L4 nerve root block by IR S/p IR L2 osteocool. No improvement of his pain.  I had a lengthy discussion with patient. Pain is likely due to radiculopathy. Refer to neurosurgeon.   Refer to neurosurgeon 05/06/23:  OT SCREEN 05/07/23: Patient arrive in wheelchair with daughter. Reports using at home rollator or rolling walker.  With a seat and able to push objects with it. Patient in a one-story townhouse.  Sleeping in a lift chair. Sponge bathing -too fearful to get and walk-in shower. Dressing himself.  Daughter  brings of meals -little hard time warming up because of back pain. Patient get PT 2  x  a week since hip surgery.  Doing most of exercises in lift chair. Discussed with patient adaptive equipment and modifications at home to stay  independent without increasing his pain as well as safety. Information provided for modifications to the shower.  Nonslip  mat under shower chair- look into more sturdy or bigger shower chair. Non slippery stick-on patches for the shower; with hand-held shower head-as well as suction handgrips OT Website information provided-where she reviews different adaptive equipment and modifications. Encourage patient to practice with home health PT getting in and out of the shower safely using the modifications before attempting to take a shower. Educated on use of the hip or knee kit-with adaptive equipment for sock aid and reacher as well as long-handle sponge and shoehorn for lower body dressing to decrease lower back pain-Velcro shoes or elastic shoelaces recommended upon patient asking about shoelaces.  Patient to use a heating pad or when using a shower-recommended prior to shoulder active range of motion to decrease stiffness-also recommend for patient to do it in supine and lift chair to let gravity assist with active range of motion. Encouraged patient to try and stay active but 4 times a day walking with rolling walker or doing some of physical therapy exercises to maintain strength and balance.  Also recommendations for keeping objects in the kitchen or refrigerator between shoulder and knee hide to increased ease for retraining using her walker and decreasing back pain Patient to follow-up as needed                                    Visit Diagnosis: Metastasis to bone [facility])    Problem List Patient Active Problem List   Diagnosis Date Noted   Anxiety associated with cancer diagnosis (HCC) 04/23/2023   Cognitive impairment 04/23/2023   Hypocalcemia 02/19/2023   Weight loss 01/29/2023   Folliculitis 01/29/2023   Extravasation accident 01/06/2023   Impending pathologic fracture 11/26/2022   Neoplasm related pain 11/04/2022   Metastasis to bone (HCC)  09/23/2022   Periodic limb movement disorder 08/19/2022   Skin rash 08/12/2022   Encounter for antineoplastic immunotherapy 07/22/2022   Goals of care, counseling/discussion 06/03/2022   Foot swelling 02/13/2022   Imbalance 02/13/2022   Right shoulder pain 02/13/2022   CPAP use counseling 08/20/2021   Obesity (BMI 30-39.9) 08/20/2021   Type II or unspecified type diabetes mellitus without mention of complication, not stated as uncontrolled 07/30/2021   CKD (chronic kidney disease) stage 3, GFR 30-59 ml/min (HCC) 07/19/2021   History of left nephrectomy 07/08/2021   Cancer of kidney, left (HCC) 06/25/2021   Acute appendicitis 04/27/2021   Pain and swelling of lower leg, right 03/05/2021   Prostate cancer (HCC) 03/05/2021  DNR (do not resuscitate) 06/13/2020   Medicare annual wellness visit, subsequent 06/13/2020   Left hip pain 03/03/2020   Status post rotator cuff surgery 03/03/2020   Type 2 diabetes mellitus with hyperlipidemia (HCC) 02/16/2020   Obesity, morbid (HCC) 02/14/2020   OSA on CPAP 10/15/2019   Status post prostatectomy 06/19/2017   Polyneuropathy 09/17/2016   B12 deficiency 08/13/2016   Lumbar radiculopathy 06/12/2016   Mixed hyperlipidemia 11/30/2015   Essential hypertension 04/26/2015   Memory loss 02/18/2015   History of squamous cell carcinoma of skin 02/15/2015    Oletta Cohn, OTR/L,CLT 05/07/2023, 1:23 PM  [facility] [address], [address] [place], [place], [address] Phone: [phone]   Fax:  [phone]  Name: Peter Stuart MRN: [id number] Date of Birth: 04/24/1946

## 2023-05-07 NOTE — Assessment & Plan Note (Addendum)
CPAP nightly ordered, however patient states that he brought his own and would like to use his own

## 2023-05-07 NOTE — Progress Notes (Addendum)
Message nursing at 1623, to obtain and documents patient temperature.  At the time of this dictation, temperature remains undocumented in the chart (19:08).

## 2023-05-07 NOTE — Progress Notes (Signed)
   05/07/23 2030  BiPAP/CPAP/SIPAP  $ Non-Invasive Home Ventilator  Initial  BiPAP/CPAP/SIPAP Pt Type Adult  Mask Type Nasal mask  Respiratory Rate 17 breaths/min  FiO2 (%) 21 %  Patient Home Equipment Yes  Safety Check Completed by RT for Home Unit Yes, no issues noted  BiPAP/CPAP /SiPAP Vitals  Pulse Rate 70  Resp 17  SpO2 98 %   Patient has home CPAP at bedside. Home unit observed to be in proper working condition, plugged into red outlet, no loose or frayed cords noted. Patient self manages, declines further assistance from RT at this time.

## 2023-05-07 NOTE — Assessment & Plan Note (Signed)
Patient is prescribed lorazepam 0.5 mg every 12 hours for anxiety however per daughter he has not required this medication

## 2023-05-08 DIAGNOSIS — F411 Generalized anxiety disorder: Secondary | ICD-10-CM

## 2023-05-08 DIAGNOSIS — G4733 Obstructive sleep apnea (adult) (pediatric): Secondary | ICD-10-CM

## 2023-05-08 DIAGNOSIS — Z905 Acquired absence of kidney: Secondary | ICD-10-CM

## 2023-05-08 DIAGNOSIS — E785 Hyperlipidemia, unspecified: Secondary | ICD-10-CM

## 2023-05-08 DIAGNOSIS — Z85828 Personal history of other malignant neoplasm of skin: Secondary | ICD-10-CM

## 2023-05-08 DIAGNOSIS — C801 Malignant (primary) neoplasm, unspecified: Secondary | ICD-10-CM

## 2023-05-08 DIAGNOSIS — I10 Essential (primary) hypertension: Secondary | ICD-10-CM

## 2023-05-08 DIAGNOSIS — C642 Malignant neoplasm of left kidney, except renal pelvis: Secondary | ICD-10-CM

## 2023-05-08 DIAGNOSIS — N179 Acute kidney failure, unspecified: Secondary | ICD-10-CM | POA: Diagnosis not present

## 2023-05-08 DIAGNOSIS — C7951 Secondary malignant neoplasm of bone: Secondary | ICD-10-CM

## 2023-05-08 DIAGNOSIS — E1169 Type 2 diabetes mellitus with other specified complication: Secondary | ICD-10-CM

## 2023-05-08 LAB — CBC
HCT: 31.2 % — ABNORMAL LOW (ref 39.0–52.0)
Hemoglobin: 10.7 g/dL — ABNORMAL LOW (ref 13.0–17.0)
MCH: 29.2 pg (ref 26.0–34.0)
MCHC: 34.3 g/dL (ref 30.0–36.0)
MCV: 85 fL (ref 80.0–100.0)
Platelets: 211 10*3/uL (ref 150–400)
RBC: 3.67 MIL/uL — ABNORMAL LOW (ref 4.22–5.81)
RDW: 13.4 % (ref 11.5–15.5)
WBC: 7.6 10*3/uL (ref 4.0–10.5)
nRBC: 0 % (ref 0.0–0.2)

## 2023-05-08 LAB — BASIC METABOLIC PANEL
Anion gap: 9 (ref 5–15)
BUN: 56 mg/dL — ABNORMAL HIGH (ref 8–23)
CO2: 20 mmol/L — ABNORMAL LOW (ref 22–32)
Calcium: 6.6 mg/dL — ABNORMAL LOW (ref 8.9–10.3)
Chloride: 107 mmol/L (ref 98–111)
Creatinine, Ser: 3.98 mg/dL — ABNORMAL HIGH (ref 0.61–1.24)
GFR, Estimated: 15 mL/min — ABNORMAL LOW (ref >60)
Glucose, Bld: 130 mg/dL — ABNORMAL HIGH (ref 70–99)
Potassium: 3.9 mmol/L (ref 3.5–5.1)
Sodium: 136 mmol/L (ref 135–145)

## 2023-05-08 NOTE — Progress Notes (Signed)
  Progress Note   Patient: Peter Stuart ZOX:[id number] DOB: 1946/05/16 DOA: 05/07/2023     1 DOS: the patient was seen and examined on 05/08/2023   Brief hospital course: Mr. 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  Vitals in the ED showed respiration rate of 20, heart rate of 83, blood pressure 120/90, SpO2 of 96% on room air.  Serum sodium is 132, potassium 3.9, chloride 102, bicarb 17, BUN of 54, serum creatinine of 4.01, EGFR 15, nonfasting blood glucose 139, WBC 9.6, hemoglobin 12.1, platelets of 240.  UA ordered and pending collection.  ED treatment: Sodium chloride infusion at 75 mL/h.  Ultrasound of the kidneys have been ordered and pending completion.  Assessment and Plan: * Acute on CKD stage 3 Hyponatremia With baseline CKD 3A Possibly chemo induced vs dehydration. Hold lisinopril and hydrochlorothiazide. Gentle IV hydration. Nephrology consult called. Avoid nephrotoxic drugs. Ultrasound of the renal no right sided hydro, h/o left nephrectomy. Daily renal function monitoring.  Anemia of chronic kidney disease (HCC) Hb stable. No active bleeding. Monitor daily CBC.  H/o left renal cell carcinoma Metastasis to bone [facility]) With right lower extremity pain Home xtampza 9 mg scheduled daily, 9:00 hrs and 2100 hrs., nonformulary order and pharmacy consultation for patient to bring in his own medication placed Home as needed oxycodone 5 mg every 6 hours as needed for severe pain resumed  OSA on CPAP CPAP nightly ordered, however patient states that he brought his own and would like to use his own  Essential hypertension Hydralazine 5 mg IV every 8 hours as needed for SBP greater 170, 4 days  ordered  Continue anxiolytics as needed. Supportive care. PT evaluation. CODE STATUS- FULL CODE.       Subjective: Patient is seen and examined today morning. He is lying in bed, offers no complaints. Making little urine, tolerating fluids well.   Physical Exam: Vitals:   05/07/23 1942 05/07/23 2030 05/08/23 0452 05/08/23 0756  BP: 112/79  118/78 105/75  Pulse: 73 70 70 73  Resp: 17 17 18 18   Temp: 98.3 F (36.8 C)  98.7 F (37.1 C) 98.7 F (37.1 C)  TempSrc: Oral   Oral  SpO2: 99% 98% 96% 99%  Weight:      Height:       General - Elderly Caucasian male, no apparent distress HEENT - PERRLA, EOMI, atraumatic head, non tender sinuses. Lung - Clear, rales, rhonchi, wheezes. Heart - S1, S2 heard, no murmurs, rubs, trace pedal edema Neuro - Alert, awake and oriented x 3, non focal exam. Skin - Warm and dry. Data Reviewed:  CBC, BMP, renal sono   Family Communication: Patient understands and agrees with above current care plan.  Disposition: Status is: Inpatient Remains inpatient appropriate because: AKI, nephrology work up.  Planned Discharge Destination: Home    Time spent: 44 minutes  Author: Marcelino Duster, MD 05/08/2023 1:55 PM  For on call review [email or web address].

## 2023-05-08 NOTE — Progress Notes (Signed)
Mobility Specialist - Progress Note   05/08/23 0923  Mobility  Activity Ambulated with assistance in room;Ambulated with assistance to bathroom;Stood at bedside;Dangled on edge of bed  Level of Assistance Standby assist, set-up cues, supervision of patient - no hands on  Assistive Device Front wheel walker  Distance Ambulated (ft) 25 ft  Activity Response Tolerated well  Mobility Referral Yes  $Mobility charge 1 Mobility  Mobility Specialist Start Time (ACUTE ONLY) [id number]  Mobility Specialist Stop Time (ACUTE ONLY) [id number]  Mobility Specialist Time Calculation (min) (ACUTE ONLY) 13 min   Pt supine in bed requesting to go to bathroom. Pt completes bed mobility HHA. Pt STS from heightened bed surface and ambulates to/from bathroom SBA. Pt returns to EOB with needs in reach and family present.   Terrilyn Saver  Mobility Specialist  05/08/23 9:25 AM

## 2023-05-08 NOTE — Progress Notes (Signed)
Mobility Specialist - Progress Note   05/08/23 0855  Mobility  Activity Ambulated with assistance in hallway;Stood at bedside;Dangled on edge of bed  Level of Assistance Standby assist, set-up cues, supervision of patient - no hands on  Assistive Device Front wheel walker  Distance Ambulated (ft) 50 ft  Activity Response Tolerated well  Mobility Referral Yes  $Mobility charge 1 Mobility  Mobility Specialist Start Time (ACUTE ONLY) 0802  Mobility Specialist Stop Time (ACUTE ONLY) [id number]  Mobility Specialist Time Calculation (min) (ACUTE ONLY) 19 min   Pt supine in bed on RA upon arrival. Pt completes bed mobility HHA. Pt STS from heightened bed and ambulates in hallway SBA with no LOB noted. Pt endorses BLE pain, Rn notified. Pt returns to bed with needs in reach and bed alarm active.   Terrilyn Saver  Mobility Specialist  05/08/23 9:23 AM

## 2023-05-08 NOTE — Progress Notes (Signed)
   05/08/23 1943  BiPAP/CPAP/SIPAP  $ Non-Invasive Home Ventilator  Subsequent  BiPAP/CPAP/SIPAP Pt Type Adult  Mask Type Nasal mask  Respiratory Rate 18 breaths/min  FiO2 (%) 21 %  Patient Home Equipment Yes  Safety Check Completed by RT for Home Unit Yes, no issues noted  BiPAP/CPAP /SiPAP Vitals  Pulse Rate 72  Resp 17  SpO2 95 %  MEWS Score/Color  MEWS Score 0  MEWS Score Color Green   Patient resting at this time, family member at bedside and will assist patient with Home CPAP when ready.

## 2023-05-08 NOTE — Plan of Care (Signed)

## 2023-05-08 NOTE — Progress Notes (Signed)
[facility]  ROUNDING NOTE   Subjective:   Peter Stuart is a 77 y.o. 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 Patient presents to the ED at the advise of nephrology for evaluation of abnormal labs. Patient has been admitted for AKI (acute kidney injury) (HCC) [N17.9] Acute renal failure, unspecified acute renal failure type [facility]) [N17.9]  Patient is known to our practice and is followed by Dr Cherylann Ratel. He was last seen in office on January 07, 2023 and providing labs for upcoming appt on 05/09/23. Patient is seen laying in bed, family at bedside. Alert and oriented. Reports poor oral intake over the last few weeks. Denies nausea or vomiting. Remains on room air. Last Keytruda injection on April 02, 2023.   Labs on ED arrival consisted sodium 132, serum bicarb 17, BUN 54, creatinine 4.01 with GFR 15, calcium 6.9, and hemoglobin 12.1.  UA appears negative.  Renal ultrasounds left-sided nephrectomy and negative obstruction on the right side.  Baseline creatinine 1.5 with GFR 47 on 12/30/2022.  We have been consulted to manage acute kidney injury.  Objective:  Vital signs in last 24 hours:  Temp:  [98.3 F (36.8 C)-98.7 F (37.1 C)] 98.7 F (37.1 C) (07/11 0756) Pulse Rate:  [70-83] 73 (07/11 0756) Resp:  [15-20] 18 (07/11 0756) BP: (105-120)/(75-90) 105/75 (07/11 0756) SpO2:  [96 %-99 %] 99 % (07/11 0756) FiO2 (%):  [21 %] 21 % (07/10 2030) Weight:  [88.5 kg] 88.5 kg (07/10 1521)  Weight change:  Filed Weights   05/07/23 1521  Weight: 88.5 kg    Intake/Output: I/O last 3 completed shifts: In: 427.4 [I.V.:427.4] Out: 1 [Urine:1]   Intake/Output this shift:  No intake/output data  recorded.  Physical Exam: General: NAD  Head: Normocephalic, atraumatic.  Dry oral mucosal membranes  Eyes: Anicteric  Lungs:  Clear to auscultation, normal effort  Heart: Regular rate and rhythm  Abdomen:  Soft, nontender, obese  Extremities: Trace peripheral edema.  Neurologic: Alert and oriented, moving all four extremities  Skin: No lesions  Access: None    Basic Metabolic Panel: Recent Labs  Lab 05/07/23 1524 05/08/23 0439  NA 132* 136  K 3.9 3.9  CL 102 107  CO2 17* 20*  GLUCOSE 139* 130*  BUN 54* 56*  CREATININE 4.01* 3.98*  CALCIUM 6.9* 6.6*  MG 2.1  --     Liver Function Tests: Recent Labs  Lab 05/07/23 1524  AST 17  ALT 17  ALKPHOS 55  BILITOT 0.7  PROT 6.0*  ALBUMIN 3.0*   No results for input(s): "LIPASE", "AMYLASE" in the last 168 hours. No results for input(s): "AMMONIA" in the last 168 hours.  CBC: Recent Labs  Lab 05/07/23 1524 05/08/23 0439  WBC 9.6 7.6  NEUTROABS 6.9  --   HGB 12.1* 10.7*  HCT 36.2* 31.2*  MCV 86.0 85.0  PLT 240 211    Cardiac Enzymes: No results for input(s): "CKTOTAL", "CKMB", "CKMBINDEX", "TROPONINI" in the last 168 hours.  BNP: Invalid input(s): "POCBNP"  CBG: No results for input(s): "GLUCAP" in the last 168 hours.  Microbiology: Results for orders placed or performed during the hospital encounter of 11/22/22  Surgical pcr screen     Status: None   Collection Time: 11/22/22  3:05 PM  Specimen: Nasal Mucosa; Nasal Swab  Result Value Ref Range Status   MRSA, PCR NEGATIVE NEGATIVE Final   Staphylococcus aureus NEGATIVE NEGATIVE Final    Comment: (NOTE) The Xpert SA Assay (FDA approved for NASAL specimens in patients 5 years of age and older), is one component of a comprehensive surveillance program. It is not intended to diagnose infection nor to guide or monitor treatment. Performed at [facility], [address] [place], [place] [address]     Coagulation Studies: No results for  input(s): "LABPROT", "INR" in the last 72 hours.  Urinalysis: Recent Labs    05/07/23 1850  COLORURINE YELLOW*  LABSPEC 1.011  PHURINE 6.0  GLUCOSEU NEGATIVE  HGBUR SMALL*  BILIRUBINUR NEGATIVE  KETONESUR NEGATIVE  PROTEINUR NEGATIVE  NITRITE NEGATIVE  LEUKOCYTESUR NEGATIVE      Imaging: US Renal  Result Date: 05/07/2023 CLINICAL DATA:  Acute on chronic kidney disease EXAM: RENAL / URINARY TRACT ULTRASOUND COMPLETE COMPARISON:  CT scan 02/12/2023 FINDINGS: Right Kidney: Renal measurements: 13.3 by 5.1 by 5.5 cm = volume: 193 mL. Echogenicity within normal limits. No mass or hydronephrosis visualized. Left Kidney: Surgically absent Bladder: Appears normal for degree of bladder distention. Other: None. IMPRESSION: 1. No right-sided hydronephrosis. 2. Left nephrectomy. Electronically Signed   By: Gaylyn Rong M.D.   On: 05/07/2023 18:22     Medications:    sodium chloride 75 mL/hr at 05/07/23 1956    ezetimibe  10 mg Oral QHS   gabapentin  600 mg Oral QHS   heparin  5,000 Units Subcutaneous Q8H   oxyCODONE ER  9 mg Oral BID   polyethylene glycol  17 g Oral Daily   acetaminophen **OR** acetaminophen, fluticasone, hydrALAZINE, melatonin, ondansetron **OR** ondansetron (ZOFRAN) IV, oxyCODONE  Assessment/ Plan:  Mr. Peter Stuart is a 77 y.o.  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  Acute kidney injury secondary to suspected dehydration. Differential diagnosis also AKI secondary to Pembrolizumab not completely excluded but less likely due to previous tolerance of this medication. Last dose  on June 5.  Chronic kidney disease is secondary to diabetes, hypertension and left nephrectomy.  Renal ultrasound shows no blockage. No IV contrast exposure.Sodium 132 on admission, correct with IV fluid bolus.  Losartan held. Agree with continued IVF.  Will order a protein creatinine ratio.  No acute indication for dialysis. Continue to avoid nephrotoxic agents and therapies.   Lab Results  Component Value Date   CREATININE 3.98 (H) 05/08/2023   CREATININE 4.01 (H) 05/07/2023   CREATININE 1.39 (H) 04/23/2023    Intake/Output Summary (Last 24 hours) at 05/08/2023 1356 Last data filed at 05/08/2023 0646 Gross per 24 hour  Intake 427.36 ml  Output 1 ml  Net 426.36 ml   2. Anemia of chronic kidney disease Lab Results  Component Value Date   HGB 10.7 (L) 05/08/2023   Hgb within acceptable range. Continue to monitor  3. Secondary Hyperparathyroidism: with outpatient labs: PTH 28, phosphorus 3.6, calcium 9.8 on 01/03/23.   Lab Results  Component Value Date   CALCIUM 6.6 (L) 05/08/2023    Patient prescribed cholecalciferol daily outpatient.  Hypocalcemia likely secondary to acute  kidney injury and poor oral intake.  May consider IV calcium gluconate supplementation.  4.  Hypertension with chronic kidney disease.  Home regimen includes ezetimibe, hydrochlorothiazide, and losartan.  Hydrochlorothiazide and losartan held in setting of kidney injury.   LOS: 1 Walter Grima 7/11/20241:56 PM

## 2023-05-08 NOTE — Plan of Care (Signed)

## 2023-05-09 ENCOUNTER — Other Ambulatory Visit: Payer: Self-pay | Admitting: Oncology

## 2023-05-09 ENCOUNTER — Telehealth: Payer: Medicare Other | Admitting: Hospice and Palliative Medicine

## 2023-05-09 ENCOUNTER — Encounter: Payer: Self-pay | Admitting: Oncology

## 2023-05-09 DIAGNOSIS — C642 Malignant neoplasm of left kidney, except renal pelvis: Secondary | ICD-10-CM | POA: Diagnosis not present

## 2023-05-09 DIAGNOSIS — N179 Acute kidney failure, unspecified: Secondary | ICD-10-CM | POA: Diagnosis not present

## 2023-05-09 DIAGNOSIS — I10 Essential (primary) hypertension: Secondary | ICD-10-CM | POA: Diagnosis not present

## 2023-05-09 DIAGNOSIS — F411 Generalized anxiety disorder: Secondary | ICD-10-CM | POA: Diagnosis not present

## 2023-05-09 LAB — BASIC METABOLIC PANEL
Anion gap: 9 (ref 5–15)
BUN: 51 mg/dL — ABNORMAL HIGH (ref 8–23)
CO2: 19 mmol/L — ABNORMAL LOW (ref 22–32)
Calcium: 6.4 mg/dL — CL (ref 8.9–10.3)
Chloride: 108 mmol/L (ref 98–111)
Creatinine, Ser: 3.53 mg/dL — ABNORMAL HIGH (ref 0.61–1.24)
GFR, Estimated: 17 mL/min — ABNORMAL LOW (ref >60)
Glucose, Bld: 127 mg/dL — ABNORMAL HIGH (ref 70–99)
Potassium: 4.1 mmol/L (ref 3.5–5.1)
Sodium: 136 mmol/L (ref 135–145)

## 2023-05-09 MED ORDER — OYSTER SHELL CALCIUM/D3 500-5 MG-MCG PO TABS
1.0000 | ORAL_TABLET | Freq: Three times a day (TID) | ORAL | Status: DC
  Administered 2023-05-09 – 2023-05-11 (×8): 1 via ORAL
  Filled 2023-05-09 (×8): qty 1

## 2023-05-09 MED ORDER — CALCIUM GLUCONATE-NACL 1-0.675 GM/50ML-% IV SOLN
1.0000 g | Freq: Once | INTRAVENOUS | Status: AC
  Administered 2023-05-09: 1000 mg via INTRAVENOUS
  Filled 2023-05-09: qty 50

## 2023-05-09 MED ORDER — XTAMPZA ER 9 MG PO C12A: 1.0000 | EXTENDED_RELEASE_CAPSULE | Freq: Two times a day (BID) | ORAL | 0 refills | Status: DC

## 2023-05-09 NOTE — Progress Notes (Signed)
[facility]  ROUNDING NOTE   Subjective:   Peter Stuart is a 77 y.o. 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 Patient presents to the ED at the advise of nephrology for evaluation of abnormal labs. Patient has been admitted for AKI (acute kidney injury) (HCC) [N17.9] Acute renal failure, unspecified acute renal failure type [facility]) [N17.9]  Patient is known to our practice and is followed by Dr Cherylann Ratel. He was last seen in office on January 07, 2023.   Update:  Sitting up in bed No family at bedside  Feels well today Appetite remains poor  Creatinine 3.53   Objective:  Vital signs in last 24 hours:  Temp:  [98 F (36.7 C)-98.8 F (37.1 C)] 98 F (36.7 C) (07/12 0731) Pulse Rate:  [65-75] 68 (07/12 0731) Resp:  [12-20] 12 (07/12 0731) BP: (110-124)/(74-78) 124/78 (07/12 0731) SpO2:  [94 %-96 %] 94 % (07/12 0731) FiO2 (%):  [21 %] 21 % (07/11 1943)  Weight change:  Filed Weights   05/07/23 1521  Weight: 88.5 kg    Intake/Output: I/O last 3 completed shifts: In: 427.4 [I.V.:427.4] Out: 3 [Urine:3]   Intake/Output this shift:  No intake/output data recorded.  Physical Exam: General: NAD  Head: Normocephalic, atraumatic.  Dry oral mucosal membranes  Eyes: Anicteric  Lungs:  Clear to auscultation, normal effort  Heart: Regular rate and rhythm  Abdomen:  Soft, nontender, obese  Extremities: Trace peripheral edema.  Neurologic: Alert and oriented, moving all four extremities  Skin: No lesions  Access: None    Basic Metabolic Panel: Recent Labs  Lab 05/07/23 1524 05/08/23 0439 05/09/23 0513  NA 132* 136 136  K 3.9 3.9 4.1  CL 102 107 108  CO2 17* 20* 19*   GLUCOSE 139* 130* 127*  BUN 54* 56* 51*  CREATININE 4.01* 3.98* 3.53*  CALCIUM 6.9* 6.6* 6.4*  MG 2.1  --   --     Liver Function Tests: Recent Labs  Lab 05/07/23 1524  AST 17  ALT 17  ALKPHOS 55  BILITOT 0.7  PROT 6.0*  ALBUMIN 3.0*   No results for input(s): "LIPASE", "AMYLASE" in the last 168 hours. No results for input(s): "AMMONIA" in the last 168 hours.  CBC: Recent Labs  Lab 05/07/23 1524 05/08/23 0439  WBC 9.6 7.6  NEUTROABS 6.9  --   HGB 12.1* 10.7*  HCT 36.2* 31.2*  MCV 86.0 85.0  PLT 240 211    Cardiac Enzymes: No results for input(s): "CKTOTAL", "CKMB", "CKMBINDEX", "TROPONINI" in the last 168 hours.  BNP: Invalid input(s): "POCBNP"  CBG: No results for input(s): "GLUCAP" in the last 168 hours.  Microbiology: Results for orders placed or performed during the hospital encounter of 11/22/22  Surgical pcr screen     Status: None   Collection Time: 11/22/22  3:05 PM   Specimen: Nasal Mucosa; Nasal Swab  Result Value Ref Range Status   MRSA, PCR NEGATIVE NEGATIVE Final   Staphylococcus aureus NEGATIVE NEGATIVE Final    Comment: (NOTE) The Xpert SA Assay (FDA approved for NASAL specimens in patients 19 years of age and older), is one component of a comprehensive surveillance program. It is not intended to diagnose infection nor to guide or monitor treatment. Performed at [facility],  [address] [place], [place] [address]     Coagulation Studies: No results for input(s): "LABPROT", "INR" in the last 72 hours.  Urinalysis: Recent Labs    05/07/23 1850  COLORURINE YELLOW*  LABSPEC 1.011  PHURINE 6.0  GLUCOSEU NEGATIVE  HGBUR SMALL*  BILIRUBINUR NEGATIVE  KETONESUR NEGATIVE  PROTEINUR NEGATIVE  NITRITE NEGATIVE  LEUKOCYTESUR NEGATIVE      Imaging: US Renal  Result Date: 05/07/2023 CLINICAL DATA:  Acute on chronic kidney disease EXAM: RENAL / URINARY TRACT ULTRASOUND COMPLETE COMPARISON:  CT scan 02/12/2023  FINDINGS: Right Kidney: Renal measurements: 13.3 by 5.1 by 5.5 cm = volume: 193 mL. Echogenicity within normal limits. No mass or hydronephrosis visualized. Left Kidney: Surgically absent Bladder: Appears normal for degree of bladder distention. Other: None. IMPRESSION: 1. No right-sided hydronephrosis. 2. Left nephrectomy. Electronically Signed   By: Gaylyn Rong M.D.   On: 05/07/2023 18:22     Medications:      calcium-vitamin D  1 tablet Oral TID with meals   ezetimibe  10 mg Oral QHS   gabapentin  600 mg Oral QHS   heparin  5,000 Units Subcutaneous Q8H   oxyCODONE ER  9 mg Oral BID   polyethylene glycol  17 g Oral Daily   acetaminophen **OR** acetaminophen, fluticasone, hydrALAZINE, melatonin, ondansetron **OR** ondansetron (ZOFRAN) IV, oxyCODONE  Assessment/ Plan:  Peter Stuart is a 77 y.o.  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  Acute kidney injury secondary to suspected dehydration. Differential diagnosis also AKI secondary to Pembrolizumab not completely excluded but less likely due to previous tolerance of this medication. Last dose on June 5.  Chronic kidney disease is secondary to diabetes, hypertension and left nephrectomy.  Renal ultrasound shows no blockage. No IV contrast exposure.Sodium 132 on admission, correct with IV fluid bolus.  Losartan held.   Creatinine continues to improve with IVF. Will continue IVF for now.    Lab Results  Component Value Date   CREATININE 3.53 (H) 05/09/2023   CREATININE 3.98 (H) 05/08/2023   CREATININE 4.01 (H) 05/07/2023    Intake/Output  Summary (Last 24 hours) at 05/09/2023 1008 Last data filed at 05/09/2023 0208 Gross per 24 hour  Intake --  Output 2 ml  Net -2 ml   2. Anemia of chronic kidney disease Lab Results  Component Value Date   HGB 10.7 (L) 05/08/2023  Hgb remains stable.   3. Secondary Hyperparathyroidism: with outpatient labs: PTH 28, phosphorus 3.6, calcium 9.8 on 01/03/23.   Lab Results  Component Value Date   CALCIUM 6.4 (LL) 05/09/2023    Patient prescribed cholecalciferol daily outpatient.  Hypocalcemia likely secondary to acute kidney injury and poor oral intake.  Calcium 6.4 today, primary team has ordered calcium gluconate 1g IV.   4.  Hypertension with chronic kidney disease.  Home regimen includes ezetimibe, hydrochlorothiazide, and losartan.  Hydrochlorothiazide and losartan held in setting of kidney injury. Blood pressure stable for this patient .    LOS: 2 Marzelle Rutten 7/12/202410:08 AM

## 2023-05-09 NOTE — Evaluation (Signed)
Occupational Therapy Evaluation Patient Details Name: Peter Stuart MRN: [id number] DOB: [birth year] Today's Date: 05/09/2023   History of Present Illness Pt is a 77 y.o. male presenting to hospital 05/07/23 with concerns of worsening kidney function (sent from his nephrology office); pt with generalized weakness and fatigue over last several weeks.  Recent imaging showing interval osseous metastatic lesion or fracture of the right posterior seventh rib.  Pt with bone metastasis.  Pt admitted with AKI, anxiety associated with CA diagnosis, and metastasis to bone (with R LE pain).  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   Clinical Impression   Pt was seen for OT evaluation this date. Pt lives alone and has family check in PRN and has an aide who assists 3 days/wk for 4hrs/day for ADL/IADL. Pt received ambulating back into the room with PT using RW. Pt presents to acute OT demonstrating impaired ADL performance and functional mobility 2/2 decreased strength, activity tolerance, balance, and safety awareness (See OT problem list for additional functional deficits). Pt currently requires CGA-MIN A For ADL transfers from elevated surfaces (has lift recliner at home), CGA in standing for toileting and grooming tasks, and intermittent assist for LB dressing 2/2 R knee pain that worsens with prolonged use. Pt educated in AE for LB dressing to improve safety and independence. pt verbalizes understanding and appreciative noting that it would be helpful. Pt would benefit from skilled OT services to address noted impairments and functional limitations (see below for any additional details) in order to maximize safety and independence while minimizing falls risk and caregiver burden.    Recommendations for follow up therapy are one component of a multi-disciplinary discharge planning process, led by the attending  physician.  Recommendations may be updated based on patient status, additional functional criteria and insurance authorization.   Assistance Recommended at Discharge Intermittent Supervision/Assistance  Patient can return home with the following A little help with walking and/or transfers;A little help with bathing/dressing/bathroom;Assistance with cooking/housework;Assist for transportation;Help with stairs or ramp for entrance;Direct supervision/assist for medications management    Functional Status Assessment  Patient has had a recent decline in their functional status and demonstrates the ability to make significant improvements in function in a reasonable and predictable amount of time.  Equipment Recommendations  None recommended by OT    Recommendations for Other Services       Precautions / Restrictions Precautions Precautions: Fall Restrictions Weight Bearing Restrictions: No      Mobility Bed Mobility Overal bed mobility: Needs Assistance Bed Mobility: Sit to Supine       Sit to supine: Min assist   General bed mobility comments: MIN A for BLE mgt    Transfers Overall transfer level: Needs assistance Equipment used: Rolling walker (2 wheels) Transfers: Sit to/from Stand Sit to Stand: Min guard, From elevated surface                  Balance Overall balance assessment: Needs assistance Sitting-balance support: No upper extremity supported, Feet supported Sitting balance-Leahy Scale: Good     Standing balance support: Bilateral upper extremity supported, During functional activity, Reliant on assistive device for balance, No upper extremity supported Standing balance-Leahy Scale: Good                             ADL either performed or assessed with clinical judgement  ADL                                         General ADL Comments: Pt requires PRN MOD A for LB ADL tasks 2/2 R knee pain, CGA for toileting in standing  without UE support, supv/SBA for standing grooming tasks at sink.     Vision         Perception     Praxis      Pertinent Vitals/Pain Pain Assessment Pain Assessment: 0-10 Pain Score: 8  Pain Location: R knee after mobility/toileting Pain Descriptors / Indicators: Aching Pain Intervention(s): Limited activity within patient's tolerance, Monitored during session, Repositioned, Patient requesting pain meds-RN notified     Hand Dominance     Extremity/Trunk Assessment Upper Extremity Assessment Upper Extremity Assessment: Generalized weakness   Lower Extremity Assessment Lower Extremity Assessment: Generalized weakness       Communication Communication Communication: No difficulties   Cognition Arousal/Alertness: Awake/alert Behavior During Therapy: WFL for tasks assessed/performed Overall Cognitive Status: No family/caregiver present to determine baseline cognitive functioning                                 General Comments: A&Ox4, follows commands, sporadic slower processing noted     General Comments       Exercises Other Exercises Other Exercises: Pt educated in AE for LB dressing to improve safety and independence. pt verbalizes understanding and appreciative noting that it would be helpful   Shoulder Instructions      Home Living Family/patient expects to be discharged to:: Private residence Living Arrangements: Alone Available Help at Discharge: Available PRN/intermittently;Personal care attendant (Pt's daughters) Type of Home: House Home Access: Stairs to enter [employer] of Steps: 1 small (brick level) step to enter (also has ramp he can put in) Entrance Stairs-Rails: None Home Layout: One level     Bathroom Shower/Tub: Producer, television/film/video: Handicapped height     Home Equipment: Rollator (4 wheels);Shower seat - built in;Grab bars - tub/shower;Other (comment);Adaptive equipment Adaptive Equipment:  Reacher Additional Comments: Lift recliner      Prior Functioning/Environment Prior Level of Function : Needs assist             Mobility Comments: Modified independent ambulating with rollator; no recent falls reported. ADLs Comments: Pt has caregiver 4 hours/day (3 days a week) to assist with ADL's/IADL's. Reports changing his clothes every couple of days.        OT Problem List: Decreased strength;Decreased safety awareness;Decreased activity tolerance;Impaired balance (sitting and/or standing);Decreased knowledge of use of DME or AE;Pain      OT Treatment/Interventions: Self-care/ADL training;Therapeutic exercise;Therapeutic activities;DME and/or AE instruction;Energy conservation;Patient/family education;Balance training    OT Goals(Current goals can be found in the care plan section) Acute Rehab OT Goals Patient Stated Goal: go home OT Goal Formulation: With patient Time For Goal Achievement: 05/23/23 Potential to Achieve Goals: Good ADL Goals Pt Will Perform Lower Body Dressing: with modified independence;with adaptive equipment;sit to/from stand Pt Will Transfer to Toilet: with modified independence;ambulating (elevated commode, LRAD) Pt Will Perform Toileting - Clothing Manipulation and hygiene: with modified independence Additional ADL Goal #1: Pt will verbalize plan to implement at least 1 learned falls prevention strategy at home.  OT Frequency: Min 1X/week    Co-evaluation  AM-PAC OT "6 Clicks" Daily Activity     Outcome Measure Help from another person eating meals?: None Help from another person taking care of personal grooming?: A Little Help from another person toileting, which includes using toliet, bedpan, or urinal?: A Little Help from another person bathing (including washing, rinsing, drying)?: A Little Help from another person to put on and taking off regular upper body clothing?: None Help from another person to put on and taking  off regular lower body clothing?: A Little 6 Click Score: 20   End of Session Equipment Utilized During Treatment: Gait belt;Rolling walker (2 wheels) Nurse Communication: Patient requests pain meds  Activity Tolerance: Patient tolerated treatment well Patient left: in bed;with call bell/phone within reach;with bed alarm set  OT Visit Diagnosis: Other abnormalities of gait and mobility (R26.89);Muscle weakness (generalized) (M62.81);Pain Pain - Right/Left: Right Pain - part of body: Knee                Time: 2841-3244 OT Time Calculation (min): 25 min Charges:  OT General Charges $OT Visit: 1 Visit OT Evaluation $OT Eval Low Complexity: 1 Low OT Treatments $Self Care/Home Management : 8-22 mins  Arman Filter., MPH, MS, OTR/L ascom [phone] 05/09/23, 4:06 PM

## 2023-05-09 NOTE — Plan of Care (Signed)
  Problem: Education: Goal: Knowledge of General Education information will improve Description: Including pain rating scale, medication(s)/side effects and non-pharmacologic comfort measures Outcome: Progressing   Problem: Clinical Measurements: Goal: Ability to maintain clinical measurements within normal limits will improve Outcome: Progressing   Problem: Activity: Goal: Risk for activity intolerance will decrease Outcome: Progressing   Problem: Nutrition: Goal: Adequate nutrition will be maintained Outcome: Progressing   Problem: Coping: Goal: Level of anxiety will decrease Outcome: Progressing   Problem: Pain Managment: Goal: General experience of comfort will improve Outcome: Progressing   Problem: Safety: Goal: Ability to remain free from injury will improve Outcome: Progressing   Problem: Skin Integrity: Goal: Risk for impaired skin integrity will decrease Outcome: Progressing   

## 2023-05-09 NOTE — Evaluation (Signed)
Physical Therapy Evaluation Patient Details Name: Peter Stuart MRN: [id number] DOB: 1945/10/30 Today's Date: 05/09/2023  History of Present Illness  Pt is a 77 y.o. male presenting to hospital 05/07/23 with concerns of worsening kidney function (sent from his nephrology office); pt with generalized weakness and fatigue over last several weeks.  Recent imaging showing interval osseous metastatic lesion or fracture of the right posterior seventh rib.  Pt with bone metastasis.  Pt admitted with AKI, anxiety associated with CA diagnosis, and metastasis to bone (with R LE pain).  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  Clinical Impression  Prior to hospital admission, pt was modified independent ambulating with 4ww; no recent falls reported; lives alone in 1 level home with small step to enter; has caregiver 4 hrs/day for 3 days/week.  No c/o pain during session.  Currently pt is CGA with transfers and ambulation 90 feet with RW use.  Generalized weakness and decreased activity tolerance from baseline noted.  Pt would currently benefit from skilled PT to address noted impairments and functional limitations (see below for any additional details).  Upon hospital discharge, pt would benefit from ongoing therapy.     Assistance Recommended at Discharge Intermittent Supervision/Assistance  If plan is discharge home, recommend the following:  Can travel by private vehicle  A little help with walking and/or transfers;A little help with bathing/dressing/bathroom;Assistance with cooking/housework;Assist for transportation;Help with stairs or ramp for entrance        Equipment Recommendations Rolling walker (2 wheels);BSC/3in1  Recommendations for Other Services       Functional Status Assessment Patient has had a recent decline in their functional status and demonstrates the ability to make significant  improvements in function in a reasonable and predictable amount of time.     Precautions / Restrictions Precautions Precautions: Fall Restrictions Weight Bearing Restrictions: No      Mobility  Bed Mobility Overal bed mobility: Needs Assistance Bed Mobility: Supine to Sit     Supine to sit: Min assist, Mod assist, HOB elevated     General bed mobility comments: assist for trunk (pt normally sleeps in lift recliner)    Transfers Overall transfer level: Needs assistance Equipment used: Rolling walker (2 wheels) Transfers: Sit to/from Stand Sit to Stand: Min guard, From elevated surface (pt reports his surfaces at home are higher and lift chair also helps him stand)           General transfer comment: pt requesting bed height to be elevated (has lift chair at home)    Ambulation/Gait Ambulation/Gait assistance: Min guard Gait Distance (Feet): 90 Feet Assistive device: Rolling walker (2 wheels) Gait Pattern/deviations: Step-through pattern, Decreased step length - right, Decreased step length - left Gait velocity: decreased     General Gait Details: steady ambulation with RW use  Stairs            Wheelchair Mobility     Tilt Bed    Modified Rankin (Stroke Patients Only)       Balance Overall balance assessment: Needs assistance Sitting-balance support: No upper extremity supported, Feet supported Sitting balance-Leahy Scale: Good Sitting balance - Comments: steady reaching within BOS   Standing balance support: Bilateral upper extremity supported, During functional activity, Reliant on assistive device for balance Standing balance-Leahy Scale: Good Standing balance comment: steady ambulating with RW use  Pertinent Vitals/Pain Pain Assessment Pain Assessment: No/denies pain    Home Living Family/patient expects to be discharged to:: Private residence Living Arrangements: Alone Available Help at  Discharge: Available PRN/intermittently;Personal care attendant (Pt's daughters) Type of Home: House Home Access: Stairs to enter Entrance Stairs-Rails: None Entrance Stairs-Number of Steps: 1 small (brick level) step to enter (also has ramp he can put in)   Home Layout: One level Home Equipment: Rollator (4 wheels);Shower seat - built in;Grab bars - tub/shower;Other (comment) Additional Comments: Lift recliner    Prior Function Prior Level of Function : Needs assist             Mobility Comments: Modified independent ambulating with rollator; no recent falls reported. ADLs Comments: Pt has caregiver 4 hours/day (3 days a week) to assist with ADL's/IADL's     Hand Dominance        Extremity/Trunk Assessment   Upper Extremity Assessment Upper Extremity Assessment: Defer to OT evaluation    Lower Extremity Assessment Lower Extremity Assessment: Generalized weakness       Communication   Communication: No difficulties  Cognition Arousal/Alertness: Awake/alert Behavior During Therapy: WFL for tasks assessed/performed Overall Cognitive Status: Within Functional Limits for tasks assessed                                          General Comments  Nursing cleared pt for participation in physical therapy.  Pt agreeable to PT session.    Exercises     Assessment/Plan    PT Assessment Patient needs continued PT services  PT Problem List Decreased strength;Decreased mobility;Decreased activity tolerance       PT Treatment Interventions DME instruction;Gait training;Stair training;Functional mobility training;Therapeutic activities;Therapeutic exercise;Balance training;Patient/family education    PT Goals (Current goals can be found in the Care Plan section)  Acute Rehab PT Goals Patient Stated Goal: to improve strength and mobility PT Goal Formulation: With patient Time For Goal Achievement: 05/23/23 Potential to Achieve Goals: Good    Frequency  Min 1X/week     Co-evaluation               AM-PAC PT "6 Clicks" Mobility  Outcome Measure Help needed turning from your back to your side while in a flat bed without using bedrails?: None Help needed moving from lying on your back to sitting on the side of a flat bed without using bedrails?: A Lot Help needed moving to and from a bed to a chair (including a wheelchair)?: A Little Help needed standing up from a chair using your arms (e.g., wheelchair or bedside chair)?: A Little Help needed to walk in hospital room?: A Little Help needed climbing 3-5 steps with a railing? : A Little 6 Click Score: 18    End of Session Equipment Utilized During Treatment: Gait belt Activity Tolerance: Patient tolerated treatment well Patient left:  (standing in bathroom with OT present) Nurse Communication: Mobility status;Precautions PT Visit Diagnosis: Other abnormalities of gait and mobility (R26.89);Muscle weakness (generalized) (M62.81)    Time: 1610-9604 PT Time Calculation (min) (ACUTE ONLY): 18 min   Charges:   PT Evaluation $PT Eval Low Complexity: 1 Low PT Treatments $Therapeutic Activity: 8-22 mins PT General Charges $$ ACUTE PT VISIT: 1 Visit        Hendricks Limes, PT 05/09/23, 3:41 PM

## 2023-05-09 NOTE — Progress Notes (Signed)
  Progress Note   Patient: Peter Stuart ZOX:[id number] DOB: 18-Jun-1946 DOA: 05/07/2023     2 DOS: the patient was seen and examined on 05/09/2023   Brief hospital course: Mr. Talon Dishner is a 77 year old male with history of left-sided renal carcinoma, stage IV, with metastasis to the bones, hyperlipidemia, hypertension, OSA on CPAP, anxiety about medical diagnoses, baseline CKD 3A, who presents to the emergency department for chief concerns of acute kidney injury from outpatient nephrology clinic.  Vitals in the ED showed respiration rate of 20, heart rate of 83, blood pressure 120/90, SpO2 of 96% on room air.  Serum sodium is 132, potassium 3.9, chloride 102, bicarb 17, BUN of 54, serum creatinine of 4.01, EGFR 15, nonfasting blood glucose 139, WBC 9.6, hemoglobin 12.1, platelets of 240.  ED treatment: Sodium chloride infusion at 75 mL/h.  Ultrasound of the kidneys done.  He is admitted for further management for acute on chronic kidney disease stage 3.  Assessment and Plan: * Acute on CKD stage 3 Hyponatremia With baseline CKD 3A Possibly chemo induced vs dehydration. Continue to hold lisinopril and hydrochlorothiazide. Gentle IV hydration per nephrology. Creatinine slowly improving. Avoid nephrotoxic drugs. Ultrasound of the renal no right sided hydro, h/o left nephrectomy. Daily renal function monitoring.  Anemia of chronic kidney disease (HCC) Hb stable. No active bleeding. Monitor daily CBC.  H/o left renal cell carcinoma Metastasis to bone (HCC) Continue pain control. Outpatient oncology follow up. Home as needed oxycodone 5 mg every 6 hours as needed for severe pain resumed  OSA on CPAP CPAP nightly ordered, however patient states that he brought his own and would like to use his own  Essential hypertension Hydralazine 5 mg IV every 8 hours as needed for SBP greater 170, 4 days ordered  Continue anxiolytics as needed. Supportive care. PT evaluation. CODE STATUS-  FULL CODE.       Subjective: Patient is seen and examined today morning. He is lying in bed, advised out of bed, work with PT. Daughter at bedside asked about home lab services as transportation is hard for him.  Physical Exam: Vitals:   05/08/23 1943 05/08/23 2021 05/09/23 0417 05/09/23 0731  BP:  124/76 120/74 124/78  Pulse: 72 75 65 68  Resp: 17 16 20 12   Temp:  98.5 F (36.9 C) 98.1 F (36.7 C) 98 F (36.7 C)  TempSrc:  Oral Oral   SpO2: 95% 94% 96% 94%  Weight:      Height:       General - Elderly Caucasian male, no apparent distress HEENT - PERRLA, EOMI, atraumatic head, non tender sinuses. Lung - Clear, rales, rhonchi, wheezes. Heart - S1, S2 heard, no murmurs, rubs, trace pedal edema Neuro - Alert, awake and oriented x 3, non focal exam. Skin - Warm and dry. Data Reviewed:  CBC, BMP, renal sono   Family Communication: Patient understands and agrees with above current care plan.  Disposition: Status is: Inpatient Remains inpatient appropriate because: AKI, nephrology work up, IV hydration.  Planned Discharge Destination: Home    Time spent: 43 minutes  Author: Marcelino Duster, MD 05/09/2023 1:08 PM  For on call review [email or web address].

## 2023-05-09 NOTE — Care Management Important Message (Signed)
Important Message  Patient Details  Name: Peter Stuart MRN: [id number] Date of Birth: 1945-12-03   Medicare Important Message Given:  N/A - LOS <3 / Initial given by admissions     Olegario Messier A [name] 05/09/2023, 7:46 AM

## 2023-05-09 NOTE — Plan of Care (Signed)

## 2023-05-09 NOTE — TOC CM/SW Note (Signed)
Transition of Care [facility]) - Inpatient Brief Assessment   Patient Details  Name: Peter Stuart MRN: [id number] Date of Birth: 1946-04-02  [facility]) CM/SW Contact:    Allena Katz, LCSW Phone Number: 05/09/2023, 11:54 AM   Clinical Narrative:    Transition of Care Asessment: Insurance and Status: Insurance coverage has been reviewed Patient has primary care physician: Yes   Prior level of function:: [address] [place] [address]- Prior/Current Home Services: No current home services Social Determinants of Health Reivew: SDOH reviewed no interventions necessary Readmission risk has been reviewed: Yes Transition of care needs: no transition of care needs at this time

## 2023-05-10 DIAGNOSIS — C642 Malignant neoplasm of left kidney, except renal pelvis: Secondary | ICD-10-CM | POA: Diagnosis not present

## 2023-05-10 DIAGNOSIS — F411 Generalized anxiety disorder: Secondary | ICD-10-CM | POA: Diagnosis not present

## 2023-05-10 DIAGNOSIS — I10 Essential (primary) hypertension: Secondary | ICD-10-CM | POA: Diagnosis not present

## 2023-05-10 DIAGNOSIS — N179 Acute kidney failure, unspecified: Secondary | ICD-10-CM | POA: Diagnosis not present

## 2023-05-10 LAB — CBC
HCT: 31.7 % — ABNORMAL LOW (ref 39.0–52.0)
Hemoglobin: 10.7 g/dL — ABNORMAL LOW (ref 13.0–17.0)
MCH: 29.2 pg (ref 26.0–34.0)
MCHC: 33.8 g/dL (ref 30.0–36.0)
MCV: 86.4 fL (ref 80.0–100.0)
Platelets: 210 10*3/uL (ref 150–400)
RBC: 3.67 MIL/uL — ABNORMAL LOW (ref 4.22–5.81)
RDW: 13.5 % (ref 11.5–15.5)
WBC: 6.7 10*3/uL (ref 4.0–10.5)
nRBC: 0 % (ref 0.0–0.2)

## 2023-05-10 LAB — BASIC METABOLIC PANEL
Anion gap: 5 (ref 5–15)
BUN: 50 mg/dL — ABNORMAL HIGH (ref 8–23)
CO2: 23 mmol/L (ref 22–32)
Calcium: 7.2 mg/dL — ABNORMAL LOW (ref 8.9–10.3)
Chloride: 107 mmol/L (ref 98–111)
Creatinine, Ser: 3.38 mg/dL — ABNORMAL HIGH (ref 0.61–1.24)
GFR, Estimated: 18 mL/min — ABNORMAL LOW (ref >60)
Glucose, Bld: 124 mg/dL — ABNORMAL HIGH (ref 70–99)
Potassium: 4.3 mmol/L (ref 3.5–5.1)
Sodium: 135 mmol/L (ref 135–145)

## 2023-05-10 MED ORDER — DIPHENHYDRAMINE HCL 25 MG PO CAPS: 25.0000 mg | ORAL_CAPSULE | Freq: Four times a day (QID) | ORAL | Status: DC | PRN

## 2023-05-10 MED ORDER — SODIUM CHLORIDE 0.9 % IV SOLN: INTRAVENOUS | Status: DC

## 2023-05-10 NOTE — Progress Notes (Signed)
[facility]  ROUNDING NOTE   Subjective:   Peter Stuart is a 77 y.o. 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 Patient presents to the ED at the advise of nephrology for evaluation of abnormal labs. Patient has been admitted for AKI (acute kidney injury) (HCC) [N17.9] Acute renal failure, unspecified acute renal failure type (HCC) [N17.9]  Wife at bedside.   IVF stopped yesterday afternoon.   Creatinine 3.38 (3.53) UOP not recorded. Patient is voiding.    Objective:  Vital signs in last 24 hours:  Temp:  [97.8 F (36.6 C)-98.8 F (37.1 C)] 98.8 F (37.1 C) (07/13 0723) Pulse Rate:  [63-78] 69 (07/13 0723) Resp:  [12-16] 14 (07/13 0723) BP: (109-129)/(74-85) 113/77 (07/13 0723) SpO2:  [91 %-97 %] 96 % (07/13 0723) FiO2 (%):  [21 %] 21 % (07/12 2041)  Weight change:  Filed Weights   05/07/23 1521  Weight: 88.5 kg    Intake/Output: I/O last 3 completed shifts: In: 1 [P.O.:1] Out: 1 [Urine:1]   Intake/Output this shift:  No intake/output data recorded.  Physical Exam: General: NAD. Laying in bed  Head: Normocephalic, atraumatic.  moist oral mucosal membranes  Eyes: Anicteric  Lungs:  Clear to auscultation, normal effort  Heart: Regular rate and rhythm  Abdomen:  Soft, nontender, obese  Extremities: no peripheral edema.  Neurologic: Alert and oriented, moving all four extremities  Skin: No lesions  Access: None    Basic Metabolic Panel: Recent Labs  Lab 05/07/23 1524 05/08/23 0439 05/09/23 0513 05/10/23 0423  NA 132* 136 136 135  K 3.9 3.9 4.1 4.3  CL 102 107 108 107  CO2 17* 20* 19* 23  GLUCOSE 139* 130* 127* 124*  BUN 54* 56* 51* 50*  CREATININE  4.01* 3.98* 3.53* 3.38*  CALCIUM 6.9* 6.6* 6.4* 7.2*  MG 2.1  --   --   --     Liver Function Tests: Recent Labs  Lab 05/07/23 1524  AST 17  ALT 17  ALKPHOS 55  BILITOT 0.7  PROT 6.0*  ALBUMIN 3.0*   No results for input(s): "LIPASE", "AMYLASE" in the last 168 hours. No results for input(s): "AMMONIA" in the last 168 hours.  CBC: Recent Labs  Lab 05/07/23 1524 05/08/23 0439 05/10/23 0423  WBC 9.6 7.6 6.7  NEUTROABS 6.9  --   --   HGB 12.1* 10.7* 10.7*  HCT 36.2* 31.2* 31.7*  MCV 86.0 85.0 86.4  PLT 240 211 210    Cardiac Enzymes: No results for input(s): "CKTOTAL", "CKMB", "CKMBINDEX", "TROPONINI" in the last 168 hours.  BNP: Invalid input(s): "POCBNP"  CBG: No results for input(s): "GLUCAP" in the last 168 hours.  Microbiology: Results for orders placed or performed during the hospital encounter of 11/22/22  Surgical pcr screen     Status: None   Collection Time: 11/22/22  3:05 PM   Specimen: Nasal Mucosa; Nasal Swab  Result Value Ref Range Status   MRSA, PCR NEGATIVE NEGATIVE Final   Staphylococcus aureus NEGATIVE NEGATIVE Final    Comment: (NOTE) The Xpert SA Assay (FDA approved for NASAL specimens in patients 5 years of age and older), is one component of a comprehensive surveillance program. It is not intended to diagnose infection nor to guide or monitor treatment. Performed at  [facility], [address], [place], [place] [address]     Coagulation Studies: No results for input(s): "LABPROT", "INR" in the last 72 hours.  Urinalysis: Recent Labs    05/07/23 1850  COLORURINE YELLOW*  LABSPEC 1.011  PHURINE 6.0  GLUCOSEU NEGATIVE  HGBUR SMALL*  BILIRUBINUR NEGATIVE  KETONESUR NEGATIVE  PROTEINUR NEGATIVE  NITRITE NEGATIVE  LEUKOCYTESUR NEGATIVE      Imaging: No results found.   Medications:    sodium chloride 50 mL/hr at 05/10/23 1226     calcium-vitamin D  1 tablet Oral TID with meals   ezetimibe  10 mg Oral QHS    gabapentin  600 mg Oral QHS   heparin  5,000 Units Subcutaneous Q8H   oxyCODONE ER  9 mg Oral BID   polyethylene glycol  17 g Oral Daily   acetaminophen **OR** acetaminophen, diphenhydrAMINE, fluticasone, hydrALAZINE, melatonin, ondansetron **OR** ondansetron (ZOFRAN) IV, oxyCODONE  Assessment/ Plan:  Mr. Peter Stuart is a 77 y.o.  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 Acute kidney injury differential includes prerenal azotemia, less likely due to pembrolizumab which was last given on 6/5. Chronic kidney disease is secondary to diabetes, hypertension and solitary kidney status post left nephrectomy.  - Resume IVF - no obstruction - no indication for dialysis.    Lab Results  Component Value Date   CREATININE 3.38 (H) 05/10/2023   CREATININE 3.53 (H) 05/09/2023   CREATININE 3.98 (H) 05/08/2023    Intake/Output Summary (Last 24 hours) at 05/10/2023 1309 Last data filed at 05/09/2023 1741 Gross per 24 hour  Intake 1 ml  Output --  Net 1 ml   2. Anemia of chronic kidney disease Lab Results  Component Value Date   HGB 10.7 (L) 05/10/2023  Hgb remains stable. No indication for ESA.   3. Secondary Hyperparathyroidism: with hypocalcemia.    Lab Results  Component Value Date   CALCIUM 7.2 (L) 05/10/2023    Patient prescribed cholecalciferol daily outpatient.  Hypocalcemia likely secondary to acute kidney injury and poor oral intake.  Calcium corrected to 8.   4.  Hypertension with chronic kidney disease.      - holding losartan and hydrochlorothiazide.    LOS: 3 Peter Stuart 7/13/20241:09 PM

## 2023-05-10 NOTE — Progress Notes (Signed)
Mobility Specialist - Progress Note   05/10/23 0919  Mobility  Activity Dangled on edge of bed;Turned to right side  Level of Assistance Minimal assist, patient does 75% or more  Assistive Device None  Distance Ambulated (ft) 0 ft  Activity Response Tolerated well  Mobility Referral Yes  $Mobility charge 1 Mobility  Mobility Specialist Start Time (ACUTE ONLY) 0901  Mobility Specialist Stop Time (ACUTE ONLY) 0916  Mobility Specialist Time Calculation (min) (ACUTE ONLY) 15 min   Pt supine in bed on RA upon arrival. Pt completes bed mobility MinA +1. Pt able to maintain balance EOB indep. Pt defers OOB mobility due to breakfast arrival. Pt left EOB with needs in reach.  Terrilyn Saver  Mobility Specialist  05/10/23 9:21 AM

## 2023-05-10 NOTE — Plan of Care (Signed)

## 2023-05-10 NOTE — Progress Notes (Signed)
Mobility Specialist - Progress Note   05/10/23 1101  Mobility  Activity Ambulated with assistance in hallway;Stood at bedside;Dangled on edge of bed  Level of Assistance Contact guard assist, steadying assist  Assistive Device Front wheel walker  Distance Ambulated (ft) 200 ft  Activity Response Tolerated well  Mobility Referral Yes  $Mobility charge 1 Mobility  Mobility Specialist Start Time (ACUTE ONLY) [id number]  Mobility Specialist Stop Time (ACUTE ONLY) [id number]  Mobility Specialist Time Calculation (min) (ACUTE ONLY) 14 min   Pt supine in bed on RA upon arrival. Pt completes bed mobility MinA +1. Pt STS and ambulates in hallway SBA with no LOB noted. Pt returns to bed with needs in reach.   Terrilyn Saver  Mobility Specialist  05/10/23 11:03 AM

## 2023-05-10 NOTE — TOC Progression Note (Signed)
Transition of Care [facility]) - Progression Note    Patient Details  Name: Peter Stuart MRN: [id number] Date of Birth: Jan 13, 1946  Transition of Care [facility]) CM/SW Contact  Allena Katz, LCSW Phone Number: 05/10/2023, 12:08 PM  Clinical Narrative:   CSW spoke with pt about HH. Pt is agreeable and would like to use [facility]. Message sent to [facility] with [facility] for PT/OT possibility for a RN depending on nephrology. Per MD pt may need lab draws but nephrology to weigh in on if this is needed. Family also would like a BSC.     Expected Discharge Plan and Services                                              Social Determinants of Health (SDOH) Interventions SDOH Screenings   Food Insecurity: No Food Insecurity (05/07/2023)  Housing: Low Risk  (05/07/2023)  Transportation Needs: No Transportation Needs (05/07/2023)  Utilities: Not At Risk (05/07/2023)  Financial Resource Strain: Low Risk  (07/12/2021)   Received from [facility], [facility]  Social Connections: Unknown (07/12/2021)   Received from [facility], [facility]  Stress: No Stress Concern Present (07/12/2021)   Received from [facility], [facility]  Tobacco Use: Low Risk  (05/07/2023)   Received from [facility]    Readmission Risk Interventions     No data to display

## 2023-05-10 NOTE — Progress Notes (Signed)
  Progress Note   Patient: Peter Stuart ZOX:[id number] DOB: 1946-02-06 DOA: 05/07/2023     3 DOS: the patient was seen and examined on 05/10/2023   Brief hospital course: Mr. 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  Vitals in the ED showed respiration rate of 20, heart rate of 83, blood pressure 120/90, SpO2 of 96% on room air.  Serum sodium is 132, potassium 3.9, chloride 102, bicarb 17, BUN of 54, serum creatinine of 4.01, EGFR 15, nonfasting blood glucose 139, WBC 9.6, hemoglobin 12.1, platelets of 240.  ED treatment: Sodium chloride infusion at 75 mL/h.  Ultrasound of the kidneys done.  He is admitted for further management for acute on chronic kidney disease stage 3.  Assessment and Plan: * Acute on CKD stage 3 Hyponatremia Baseline CKD 3A Possibly chemo induced vs dehydration. Continue to hold lisinopril and hydrochlorothiazide. Gentle IV hydration NS @ 53ml/ hr. Discussed with nephrologist. Creatinine slowly improving. Avoid nephrotoxic drugs.  Anemia of chronic kidney disease (HCC) Hb stable. No active bleeding. Monitor daily CBC.  H/o left renal cell carcinoma Metastasis to bone (HCC) Continue pain control. Outpatient oncology follow up. Continue oxycodone 5 mg every 6 hours as needed for severe pain resumed  OSA on CPAP Continue home CPAP nightly.  Essential hypertension BP lower side, caution with antihypertensives.  Benadryl PRN for itching. Supportive care. PT evaluation. CODE STATUS- FULL CODE.       Subjective: Patient is seen and examined today morning. He is lying in bed, has itching attributes to chemo meds. Unable to tell me what med he takes for itching. States he  walked with PT/ walker. Daughter at bedside.  Physical Exam: Vitals:   05/09/23 1612 05/09/23 1944 05/10/23 0434 05/10/23 0723  BP: 120/84 129/85 109/74 113/77  Pulse: 78 78 63 69  Resp: 12 16 16 14   Temp: 98.7 F (37.1 C) 98.8 F (37.1 C) 97.8 F (36.6 C) 98.8 F (37.1 C)  TempSrc: Oral  Oral   SpO2: 97% 97% 91% 96%  Weight:      Height:       General - Elderly Caucasian male, no apparent distress HEENT - PERRLA, EOMI, atraumatic head, non tender sinuses. Lung - Clear, rales, rhonchi, wheezes. Heart - S1, S2 heard, no murmurs, rubs, trace pedal edema Neuro - Alert, awake and oriented x 3, non focal exam. Skin - Warm and dry. Diffuse erythematous rash over neck, chest area. Data Reviewed:  CBC, BMP, renal sono   Family Communication: Patient understands and agrees with above current care plan.  Disposition: Status is: Inpatient Remains inpatient appropriate because: AKI, nephrology work up, IV hydration.  Planned Discharge Destination: Home    Time spent: 44 minutes  Author: Marcelino Duster, MD 05/10/2023 11:20 AM  For on call review [email or web address].

## 2023-05-10 NOTE — Progress Notes (Signed)
Patient is not able to walk the distance required to go the bathroom, or he/she is unable to safely negotiate stairs required to access the bathroom.  A 3in1 BSC will alleviate this problem  

## 2023-05-10 NOTE — Plan of Care (Signed)

## 2023-05-11 DIAGNOSIS — E782 Mixed hyperlipidemia: Secondary | ICD-10-CM

## 2023-05-11 DIAGNOSIS — C642 Malignant neoplasm of left kidney, except renal pelvis: Secondary | ICD-10-CM | POA: Diagnosis not present

## 2023-05-11 DIAGNOSIS — N179 Acute kidney failure, unspecified: Secondary | ICD-10-CM | POA: Diagnosis not present

## 2023-05-11 DIAGNOSIS — I10 Essential (primary) hypertension: Secondary | ICD-10-CM | POA: Diagnosis not present

## 2023-05-11 DIAGNOSIS — F411 Generalized anxiety disorder: Secondary | ICD-10-CM | POA: Diagnosis not present

## 2023-05-11 LAB — BASIC METABOLIC PANEL
Anion gap: 10 (ref 5–15)
BUN: 43 mg/dL — ABNORMAL HIGH (ref 8–23)
CO2: 18 mmol/L — ABNORMAL LOW (ref 22–32)
Calcium: 7.7 mg/dL — ABNORMAL LOW (ref 8.9–10.3)
Chloride: 107 mmol/L (ref 98–111)
Creatinine, Ser: 2.96 mg/dL — ABNORMAL HIGH (ref 0.61–1.24)
GFR, Estimated: 21 mL/min — ABNORMAL LOW (ref >60)
Glucose, Bld: 126 mg/dL — ABNORMAL HIGH (ref 70–99)
Potassium: 4.2 mmol/L (ref 3.5–5.1)
Sodium: 135 mmol/L (ref 135–145)

## 2023-05-11 NOTE — Progress Notes (Signed)
Pt dressed and ready to go; pt discharged via wheelchair by nursing to [facility] entrance

## 2023-05-11 NOTE — Progress Notes (Signed)
[facility]  ROUNDING NOTE   Subjective:   Peter Stuart is a 77 y.o. 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 Patient presents to the ED at the advise of nephrology for evaluation of abnormal labs. Patient has been admitted for AKI (acute kidney injury) (HCC) [N17.9] Acute renal failure, unspecified acute renal failure type (HCC) [N17.9]  Patient complains that his pain is not well controlled.   Creatinine 2.96 (3.38) IVF: NS at 81mL/hr   Objective:  Vital signs in last 24 hours:  Temp:  [97.4 F (36.3 C)-98.4 F (36.9 C)] 98.2 F (36.8 C) (07/14 0806) Pulse Rate:  [60-68] 60 (07/14 0806) Resp:  [12-16] 12 (07/14 0806) BP: (112-123)/(76-99) 119/76 (07/14 0806) SpO2:  [94 %-100 %] 100 % (07/14 0806) FiO2 (%):  [21 %] 21 % (07/13 2155)  Weight change:  Filed Weights   05/07/23 1521  Weight: 88.5 kg    Intake/Output: I/O last 3 completed shifts: In: 240 [P.O.:240] Out: -    Intake/Output this shift:  Total I/O In: 947.9 [I.V.:947.9] Out: -   Physical Exam: General: NAD. Sitting on edge of the bed  Head: Normocephalic, atraumatic.  moist oral mucosal membranes  Eyes: Anicteric  Lungs:  Clear to auscultation, normal effort  Heart: Regular rate and rhythm  Abdomen:  Soft, nontender, obese  Extremities: no peripheral edema.  Neurologic: Alert and oriented, moving all four extremities  Skin: No lesions  Access: None    Basic Metabolic Panel: Recent Labs  Lab 05/07/23 1524 05/08/23 0439 05/09/23 0513 05/10/23 0423 05/11/23 0822  NA 132* 136 136 135 135  K 3.9 3.9 4.1 4.3 4.2  CL 102 107 108 107 107  CO2 17* 20* 19* 23 18*  GLUCOSE 139* 130* 127* 124*  126*  BUN 54* 56* 51* 50* 43*  CREATININE 4.01* 3.98* 3.53* 3.38* 2.96*  CALCIUM 6.9* 6.6* 6.4* 7.2* 7.7*  MG 2.1  --   --   --   --     Liver Function Tests: Recent Labs  Lab 05/07/23 1524  AST 17  ALT 17  ALKPHOS 55  BILITOT 0.7  PROT 6.0*  ALBUMIN 3.0*   No results for input(s): "LIPASE", "AMYLASE" in the last 168 hours. No results for input(s): "AMMONIA" in the last 168 hours.  CBC: Recent Labs  Lab 05/07/23 1524 05/08/23 0439 05/10/23 0423  WBC 9.6 7.6 6.7  NEUTROABS 6.9  --   --   HGB 12.1* 10.7* 10.7*  HCT 36.2* 31.2* 31.7*  MCV 86.0 85.0 86.4  PLT 240 211 210    Cardiac Enzymes: No results for input(s): "CKTOTAL", "CKMB", "CKMBINDEX", "TROPONINI" in the last 168 hours.  BNP: Invalid input(s): "POCBNP"  CBG: No results for input(s): "GLUCAP" in the last 168 hours.  Microbiology: Results for orders placed or performed during the hospital encounter of 11/22/22  Surgical pcr screen     Status: None   Collection Time: 11/22/22  3:05 PM   Specimen: Nasal Mucosa; Nasal Swab  Result Value Ref Range Status   MRSA, PCR NEGATIVE NEGATIVE Final   Staphylococcus aureus NEGATIVE NEGATIVE Final    Comment: (NOTE) The Xpert SA Assay (FDA approved for NASAL specimens in patients 24 years of age and older), is one component of a comprehensive  surveillance program. It is not intended to diagnose infection nor to guide or monitor treatment. Performed at [facility], [address] [place], [place] [address]     Coagulation Studies: No results for input(s): "LABPROT", "INR" in the last 72 hours.  Urinalysis: No results for input(s): "COLORURINE", "LABSPEC", "PHURINE", "GLUCOSEU", "HGBUR", "BILIRUBINUR", "KETONESUR", "PROTEINUR", "UROBILINOGEN", "NITRITE", "LEUKOCYTESUR" in the last 72 hours.  Invalid input(s): "APPERANCEUR"     Imaging: No results found.   Medications:    sodium chloride 50 mL/hr at 05/11/23 0901     calcium-vitamin D  1  tablet Oral TID with meals   ezetimibe  10 mg Oral QHS   gabapentin  600 mg Oral QHS   heparin  5,000 Units Subcutaneous Q8H   oxyCODONE ER  9 mg Oral BID   polyethylene glycol  17 g Oral Daily   acetaminophen **OR** acetaminophen, diphenhydrAMINE, fluticasone, hydrALAZINE, melatonin, ondansetron **OR** ondansetron (ZOFRAN) IV, oxyCODONE  Assessment/ Plan:  Peter Stuart is a 77 y.o.  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 Acute kidney injury differential includes prerenal azotemia, less likely due to pembrolizumab which was last given on 6/5. Chronic kidney disease is secondary to diabetes, hypertension and solitary kidney status post left nephrectomy.  - Discontinue IV fluids - encourage PO intake.  - hold losartan and hydrochlorothiazide - Follow up in 1-2 weeks with nephrology as outpatient.    Lab Results  Component Value Date   CREATININE 2.96 (H) 05/11/2023   CREATININE 3.38 (H) 05/10/2023   CREATININE 3.53 (H) 05/09/2023    Intake/Output Summary (Last 24 hours) at 05/11/2023 1019 Last data filed at 05/11/2023 0859 Gross per 24 hour  Intake 1187.91 ml  Output --  Net 1187.91 ml   2. Anemia of chronic kidney disease Lab Results  Component Value Date   HGB 10.7 (L) 05/10/2023  Hgb remains stable. No indication for ESA.   3. Secondary Hyperparathyroidism: with hypocalcemia.    Lab Results  Component Value Date   CALCIUM 7.7 (L) 05/11/2023    Patient prescribed cholecalciferol daily outpatient.  Hypocalcemia likely secondary to acute kidney injury and poor oral intake.    4.   Hypertension with chronic kidney disease.      - holding losartan and hydrochlorothiazide.    LOS: 4 Kaius Daino 7/14/202410:19 AM

## 2023-05-11 NOTE — Plan of Care (Signed)

## 2023-05-11 NOTE — Progress Notes (Signed)
MD order received in [facility] to discharge pt home today; verbally reviewed AVS with pt and his daughter, no questions voiced at this time; pt's discharge pending pt getting dressed to go home; NT to assist pt

## 2023-05-11 NOTE — Progress Notes (Signed)
Mobility Specialist - Progress Note   05/11/23 0946  Mobility  Activity Ambulated with assistance in hallway;Stood at bedside;Dangled on edge of bed  Level of Assistance Standby assist, set-up cues, supervision of patient - no hands on  Assistive Device Front wheel walker  Distance Ambulated (ft) 100 ft  Activity Response Tolerated well  Mobility Referral Yes  $Mobility charge 1 Mobility  Mobility Specialist Start Time (ACUTE ONLY) 0919  Mobility Specialist Stop Time (ACUTE ONLY) 0941  Mobility Specialist Time Calculation (min) (ACUTE ONLY) 22 min   Pt supine in bed on RA upon arrival. Pt completes bed mobility HHA. Pt STS from heightened bed surface and ambulates in hallway SBA. Pt returns to bed with needs in reach.   Terrilyn Saver  Mobility Specialist  05/11/23 9:48 AM

## 2023-05-11 NOTE — Discharge Instructions (Signed)
The Nephrologist's office will call you to set up follow up appointment including lab work.

## 2023-05-12 ENCOUNTER — Inpatient Hospital Stay (HOSPITAL_BASED_OUTPATIENT_CLINIC_OR_DEPARTMENT_OTHER): Payer: Medicare Other | Admitting: Hospice and Palliative Medicine

## 2023-05-12 DIAGNOSIS — G893 Neoplasm related pain (acute) (chronic): Secondary | ICD-10-CM

## 2023-05-12 DIAGNOSIS — Z515 Encounter for palliative care: Secondary | ICD-10-CM | POA: Diagnosis not present

## 2023-05-12 DIAGNOSIS — C7951 Secondary malignant neoplasm of bone: Secondary | ICD-10-CM | POA: Diagnosis not present

## 2023-05-12 DIAGNOSIS — C642 Malignant neoplasm of left kidney, except renal pelvis: Secondary | ICD-10-CM

## 2023-05-12 NOTE — Progress Notes (Signed)
Virtual Visit via Telephone Note  I connected with Peter Stuart on 05/12/23 at  1:00 PM EDT by telephone and verified that I am speaking with the correct person using two identifiers.  Location: Patient: Home Provider: Clinic   I discussed the limitations, risks, security and privacy concerns of performing an evaluation and management service by telephone and the availability of in person appointments. I also discussed with the patient that there may be a patient responsible charge related to this service. The patient expressed understanding and agreed to proceed.   History of Present Illness: Peter Stuart is a 77 y.o. 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  Patient is currently on treatment with Keytruda.  Patient is status post XRT to his L-spine and left femoral shaft. Patient was hospitalized 11/26/2022 to 11/28/2022 with pathological fracture of the left femur and underwent pinning.    Observations/Objective: I spoke with patient and daughter by phone.  Patient discharged yesterday from the hospital after being admitted with AKI.  He is pending outpatient follow-up with nephrology.  Patient says he is doing reasonably okay since returning home.  He denies any new symptomatic complaints.  He does continue to endorse bilateral upper extremity/hand stiffness and pain.  Bone scan reviewed with patient.  He has noted to have bilateral degenerative changes in the shoulders, which could be the culprit.  Patient pending CTs.  Could consider referral to Ortho but I would not recommend dose adjustment of his opioid pain medications at this time given recent AKI.  Assessment and Plan: Metastatic RCC -on immunotherapy  Neoplasm related pain -on Xtampza ER/oxycodone IR  Shoulder pain/stiffness -degenerative changes in shoulders.  Could consider referral to Ortho.  Follow Up Instructions: Follow-up telephone visit 1-2 months   I discussed the assessment  and treatment plan with the patient. The patient was provided an opportunity to ask questions and all were answered. The patient agreed with the plan and demonstrated an understanding of the instructions.   The patient was advised to call back or seek an in-person evaluation if the symptoms worsen or if the condition fails to improve as anticipated.  I provided 10 minutes of non-face-to-face time during this encounter.   Malachy Moan, NP

## 2023-05-12 NOTE — Discharge Summary (Signed)
Physician Discharge Summary   Patient: Peter Stuart MRN: [id number] DOB: [birth year]  Admit date:     05/07/2023  Discharge date: 05/11/2023  Discharge Physician: Marcelino Duster   PCP: Marisue Ivan, MD   Recommendations at discharge:    PCP, Nephrology follow up. Follow kidney function, electrolytes.  Discharge Diagnoses: Principal Problem:   AKI (acute kidney injury) (HCC) Active Problems:   Essential hypertension   OSA on CPAP   History of squamous cell carcinoma of skin   Memory loss   Mixed hyperlipidemia   Type 2 diabetes mellitus with hyperlipidemia (HCC)   Cancer of kidney, left (HCC)   History of left nephrectomy   Metastasis to bone (HCC)   Anxiety associated with cancer diagnosis (HCC)  Resolved Problems:   * No resolved hospital problems. *  Hospital Course: Peter Stuart is a 77 year old male with history of left-sided renal carcinoma, stage IV, with metastasis to the bones, hyperlipidemia, hypertension, OSA on CPAP, anxiety about medical diagnoses, baseline CKD 3A, who presents to the emergency department for chief concerns of acute kidney injury from outpatient nephrology clinic. He is started on IV hydration admitted to hospitalist service with nephrology consultation. Acute kidney injury differential includes prerenal azotemia, less likely due to pembrolizumab which was last given on 6/5. Chronic kidney disease is secondary to diabetes, hypertension and solitary kidney status post left nephrectomy. Nephrology team advised to hold Losartan, hydrochlorothiazide. His renal sono unremarkable. He does have chronic itching, benadryl helped. Calcium low, got supplementation. Kidney function gradually improving with fluids. Advised nephrology follow up with repeat labs. He wishes to go home. Understands to follow PCP, Nephrology and oncology as instructed. Patient and his daughter understand and agree with discharge plan.       Consultants:  Nephrology  Procedures performed: none  Disposition: Home Diet recommendation:  Discharge Diet Orders (From admission, onward)     Start     Ordered   05/11/23 0000  Diet - low sodium heart healthy        05/11/23 1304           Cardiac diet DISCHARGE MEDICATION: Allergies as of 05/11/2023       Reactions   Robaxin [methocarbamol]    ? hives   Tramadol Hives   Possible hives        Medication List     STOP taking these medications    hydrochlorothiazide 25 MG tablet Commonly known as: HYDRODIURIL   KEYTRUDA IV   losartan 100 MG tablet Commonly known as: COZAAR       TAKE these medications    Allegra Allergy 60 MG tablet Generic drug: fexofenadine Take 60 mg by mouth daily.   BENADRYL ITCH RELIEF EX Apply topically.   cholecalciferol 25 MCG (1000 UNIT) tablet Commonly known as: VITAMIN D3 Take 1,000 Units by mouth daily.   clotrimazole 1 % cream Commonly known as: LOTRIMIN Apply topically 2 (two) times daily.   Coenzyme Q10 10 MG capsule Take 10 mg by mouth daily.   ezetimibe 10 MG tablet Commonly known as: ZETIA Take 1 tablet by mouth daily.   fluticasone 50 MCG/ACT nasal spray Commonly known as: FLONASE Place 2 sprays into both nostrils daily.   gabapentin 600 MG tablet Commonly known as: NEURONTIN Take 600 mg by mouth at bedtime.   GINKGO BILOBA COMPLEX PO Take 1 tablet by mouth daily.   LORazepam 0.5 MG tablet Commonly known as: ATIVAN Take 1 tablet (0.5 mg total) by mouth every 12 (twelve) hours  as needed for anxiety.   megestrol 40 MG tablet Commonly known as: MEGACE Take 1 tablet (40 mg total) by mouth daily.   multivitamin capsule Take 1 capsule by mouth daily.   multivitamin-lutein Caps capsule Take 1 capsule by mouth daily.   oxyCODONE 5 MG immediate release tablet Commonly known as: Oxy IR/ROXICODONE Take 1 tablet (5 mg total) by mouth every 6 (six) hours as needed for severe pain.   pantoprazole 40 MG  tablet Commonly known as: Protonix Take 1 tablet (40 mg total) by mouth daily.   polyethylene glycol 17 g packet Commonly known as: MIRALAX / GLYCOLAX Take 17 g by mouth daily.   PreviDent 5000 Booster Plus 1.1 % Pste Generic drug: Sodium Fluoride Take by mouth.   triamcinolone cream 0.1 % Commonly known as: KENALOG Apply 1 Application topically 2 (two) times daily.   Xtampza ER 9 MG C12a Generic drug: oxyCODONE ER Take 1 capsule by mouth in the morning and at bedtime.        Discharge Exam: Filed Weights   05/07/23 1521  Weight: 88.5 kg   General - Elderly Caucasian male, no apparent distress HEENT - PERRLA, EOMI, atraumatic head, non tender sinuses. Lung - Clear, rales, rhonchi, wheezes. Heart - S1, S2 heard, no murmurs, rubs, trace pedal edema Neuro - Alert, awake and oriented x 3, non focal exam. Skin - Warm and dry. Diffuse erythematous rash over neck, chest area improved.  Condition at discharge: stable  The results of significant diagnostics from this hospitalization (including imaging, microbiology, ancillary and laboratory) are listed below for reference.   Imaging Studies: NM Bone Scan Whole Body  Result Date: 05/07/2023 CLINICAL DATA:  Renal and prostate cancer. Metastatic disease. Prior left nephrectomy. EXAM: NUCLEAR MEDICINE WHOLE BODY BONE SCAN TECHNIQUE: Whole body anterior and posterior images were obtained approximately 3 hours after intravenous injection of radiopharmaceutical. RADIOPHARMACEUTICALS:  20.9 mCi Technetium-64m MDP IV COMPARISON:  Multiple exams, including 06/11/2022 FINDINGS: Degenerative activity noted along the shoulders and sternoclavicular joints. New/abnormal activity in the right posterolateral seventh rib compatible with fracture or interval osseous metastatic lesion. Absent left kidney noted. Accentuated activity in the left proximal femur, probably related to prior fracture and intramedullary nail system placement. The patient has a  known L2 lesion eccentric to the right, this is not readily distinguishable on today's exam although there is some lumbar activity likely related to degenerative facet arthropathy. Increased right posterior ischial activity, suspicious for metastatic lesion (also shown on sacral MRI of 01/15/2023). Potential accentuated activity along the pubis although this is an uncertain finding. IMPRESSION: 1. Interval osseous metastatic lesion or fracture of the right posterior seventh rib. 2. Right posterior ischial activity compatible with metastatic lesion, also shown on sacral MRI of 01/15/2023. 3. Accentuated activity in the left proximal femur likely related to prior fracture and IM nail system placement. 4. Absent left kidney. 5. Degenerative findings along the shoulders. 6. Questionable activity along the pubis. Electronically Signed   By: Gaylyn Rong M.D.   On: 05/07/2023 18:34   US Renal  Result Date: 05/07/2023 CLINICAL DATA:  Acute on chronic kidney disease EXAM: RENAL / URINARY TRACT ULTRASOUND COMPLETE COMPARISON:  CT scan 02/12/2023 FINDINGS: Right Kidney: Renal measurements: 13.3 by 5.1 by 5.5 cm = volume: 193 mL. Echogenicity within normal limits. No mass or hydronephrosis visualized. Left Kidney: Surgically absent Bladder: Appears normal for degree of bladder distention. Other: None. IMPRESSION: 1. No right-sided hydronephrosis. 2. Left nephrectomy. Electronically Signed   By: Zollie Beckers  Ova Freshwater M.D.   On: 05/07/2023 18:22   MR Brain W Wo Contrast  Result Date: 05/05/2023 CLINICAL DATA:  Provided history: Cancer of kidney, left. Metastasis to bone. Additional history provided: History of prostate cancer. EXAM: MRI HEAD WITHOUT AND WITH CONTRAST TECHNIQUE: Multiplanar, multiecho pulse sequences of the brain and surrounding structures were obtained without and with intravenous contrast. CONTRAST:  10mL GADAVIST GADOBUTROL 1 MMOL/ML IV SOLN COMPARISON:  None. FINDINGS: The sagittal T1-weighted  post-contrast sequence is moderately motion degraded. Within this limitation, findings are swallows. Brain: Mild generalized cerebral atrophy. Multifocal T2 FLAIR hyperintense signal abnormality within the cerebral white matter, nonspecific but compatible with minimal chronic small vessel ischemic disease. There is no acute infarct. No evidence of an intracranial mass. No extra-axial fluid collection. No midline shift. No pathologic intracranial enhancement identified. Vascular: Maintained flow voids within the proximal large arterial vessels. Skull and upper cervical spine: No focal suspicious marrow lesion. Sinuses/Orbits: No mass or acute finding within the imaged orbits. Trace mucosal thickening within the bilateral ethmoid sinuses. IMPRESSION: 1. The sagittal T1-weighted post-contrast sequence is moderately motion degraded. However, the axial and coronal T1-weighted post-contrast sequences are of good quality. 2. No evidence of intracranial metastatic disease. 3. Minimal chronic small vessel ischemic changes within the cerebral white matter. 4. Mild generalized cerebral atrophy. Electronically Signed   By: Jackey Loge D.O.   On: 05/05/2023 15:14   IR Radiologist Eval & Mgmt  Result Date: 04/22/2023 EXAM: ESTABLISHED PATIENT OFFICE VISIT CHIEF COMPLAINT: Right lower extremity radiculopathy in the setting of osseous metastatic disease to the lumbar spine status post OsteoCool on 04/08/2023 HISTORY OF PRESENT ILLNESS: The patient has a history of RCC with osseous metastatic disease to the L2 vertebral body with resultant right lower extremity radiculopathy. Patient was initially treated with serial selective nerve root block/steroid injections targeting the right L2-L3 and right L3-L4 nerve roots. Although this initially resulted in moderate improvement in his pain, the relief is only short-lived lasting 2-3 weeks after each injection. Given the desire for more permanent pain relief, the decision was made to  proceed with OsteoCool of the L2 vertebral body, with hopes of reducing tumor burden and relieving the nerve root compression. The patient underwent L2 OsteoCool on April 08, 2023 without complication. He follows up today 2 weeks after the procedure. Unfortunately, the patient states that he has had no appreciable pain relief since the procedure. He has continued right lower extremity radiculopathy. The patient is now 2 weeks status post L2 OsteoCool without appreciable relief in his right lower extremity radiculopathy. He has had previous temporary relief with epidural steroid injections/selective nerve root blocks on the right side, which we can continue to offer. We will coordinate further with the oncology team. REVIEW OF SYSTEMS: See above PHYSICAL EXAMINATION: See above ASSESSMENT AND PLAN: See above.  Follow-up PRN. Electronically Signed   By: Olive Bass M.D.   On: 04/22/2023 16:37    Microbiology: Results for orders placed or performed during the hospital encounter of 11/22/22  Surgical pcr screen     Status: None   Collection Time: 11/22/22  3:05 PM   Specimen: Nasal Mucosa; Nasal Swab  Result Value Ref Range Status   MRSA, PCR NEGATIVE NEGATIVE Final   Staphylococcus aureus NEGATIVE NEGATIVE Final    Comment: (NOTE) The Xpert SA Assay (FDA approved for NASAL specimens in patients 2 years of age and older), is one component of a comprehensive surveillance program. It is not intended to diagnose infection nor  to guide or monitor treatment. Performed at [facility], [address] [place], [place] [address]     Labs: CBC: Recent Labs  Lab 05/07/23 1524 05/08/23 0439 05/10/23 0423  WBC 9.6 7.6 6.7  NEUTROABS 6.9  --   --   HGB 12.1* 10.7* 10.7*  HCT 36.2* 31.2* 31.7*  MCV 86.0 85.0 86.4  PLT 240 211 210   Basic Metabolic Panel: Recent Labs  Lab 05/07/23 1524 05/08/23 0439 05/09/23 0513 05/10/23 0423 05/11/23 0822  NA 132* 136 136 135 135  K 3.9 3.9 4.1  4.3 4.2  CL 102 107 108 107 107  CO2 17* 20* 19* 23 18*  GLUCOSE 139* 130* 127* 124* 126*  BUN 54* 56* 51* 50* 43*  CREATININE 4.01* 3.98* 3.53* 3.38* 2.96*  CALCIUM 6.9* 6.6* 6.4* 7.2* 7.7*  MG 2.1  --   --   --   --    Liver Function Tests: Recent Labs  Lab 05/07/23 1524  AST 17  ALT 17  ALKPHOS 55  BILITOT 0.7  PROT 6.0*  ALBUMIN 3.0*   CBG: No results for input(s): "GLUCAP" in the last 168 hours.  Discharge time spent: greater than 30 minutes.  Signed: Marcelino Duster, MD [facility] 05/12/2023

## 2023-05-13 ENCOUNTER — Encounter: Payer: Self-pay | Admitting: Oncology

## 2023-05-15 ENCOUNTER — Ambulatory Visit
Admission: RE | Admit: 2023-05-15 | Discharge: 2023-05-15 | Disposition: A | Discharge status: Home / Self Care | Payer: Medicare Other | Source: Ambulatory Visit | Attending: Pulmonary Disease | Admitting: Pulmonary Disease

## 2023-05-15 ENCOUNTER — Telehealth: Payer: Self-pay | Admitting: Oncology

## 2023-05-15 DIAGNOSIS — C7951 Secondary malignant neoplasm of bone: Secondary | ICD-10-CM

## 2023-05-15 DIAGNOSIS — C61 Malignant neoplasm of prostate: Secondary | ICD-10-CM

## 2023-05-15 DIAGNOSIS — R918 Other nonspecific abnormal finding of lung field: Secondary | ICD-10-CM | POA: Diagnosis not present

## 2023-05-15 DIAGNOSIS — C642 Malignant neoplasm of left kidney, except renal pelvis: Secondary | ICD-10-CM

## 2023-05-15 NOTE — Telephone Encounter (Signed)
Received a call from the patients daughter, she requested to cancel all appointments. I asked if she wanted to reschedule, she said no and that his pcp referred him to hospice.

## 2023-05-20 ENCOUNTER — Ambulatory Visit: Payer: Medicare Other | Admitting: Pulmonary Disease

## 2023-05-21 ENCOUNTER — Inpatient Hospital Stay: Payer: Medicare Other | Admitting: Oncology

## 2023-05-21 ENCOUNTER — Inpatient Hospital Stay: Payer: Medicare Other

## 2023-05-21 ENCOUNTER — Encounter: Payer: Self-pay | Admitting: Oncology

## 2023-05-28 ENCOUNTER — Encounter: Payer: Self-pay | Admitting: Oncology

## 2023-05-29 ENCOUNTER — Encounter: Payer: Self-pay | Admitting: Oncology

## 2023-05-30 ENCOUNTER — Telehealth: Payer: Self-pay

## 2023-05-30 NOTE — Telephone Encounter (Signed)
Theres a ph note from 7/18 from daughter, stating that PCP had referred pt to hospice.

## 2023-05-30 NOTE — Telephone Encounter (Signed)
-----   Message from Rickard Patience sent at 05/30/2023  2:26 PM EDT ----- CT scan showed stable bone metastatic disease. Lung nodules have resolved.  Please reach out to see if he would like to resume treatment in the next 2-3 weeks.  Let me know thanks.  zy

## 2023-06-26 ENCOUNTER — Encounter: Payer: Self-pay | Admitting: Oncology

## 2023-06-29 DEATH — deceased

## 2023-07-07 ENCOUNTER — Encounter: Payer: Self-pay | Admitting: Oncology

## 2023-07-14 ENCOUNTER — Telehealth: Payer: Medicare Other | Admitting: Hospice and Palliative Medicine

## 2023-08-07 IMAGING — US US RENAL
1 series · 14 of 22 positions shown · non-contrast
Comparison: CT abdomen pelvis 04/27/2021

CLINICAL DATA: Stage IIIB chronic kidney disease. Left nephrectomy
06/25/2021 for renal cell carcinoma

EXAM:
RENAL / URINARY TRACT ULTRASOUND COMPLETE

[Series 1: renal · 14 of 22 slices shown]
[im 1/22]
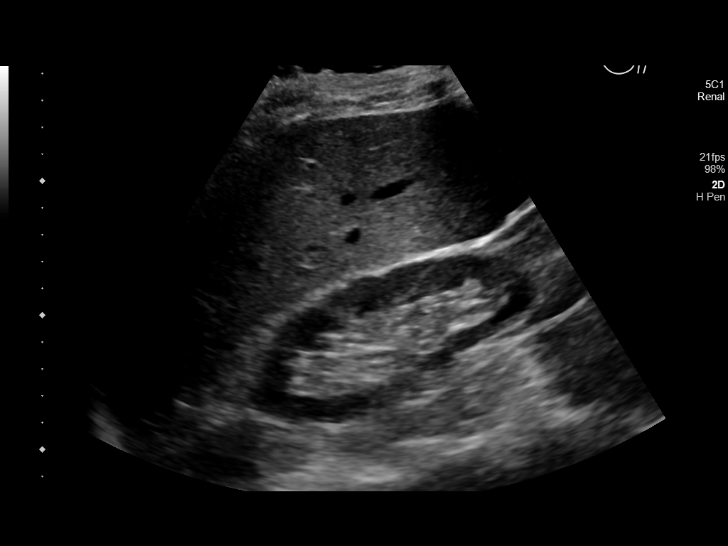
[im 3/22]
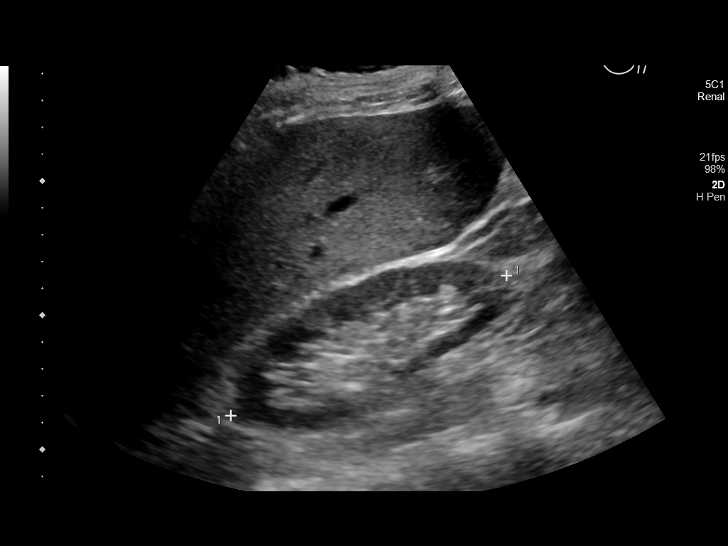
[im 4/22]
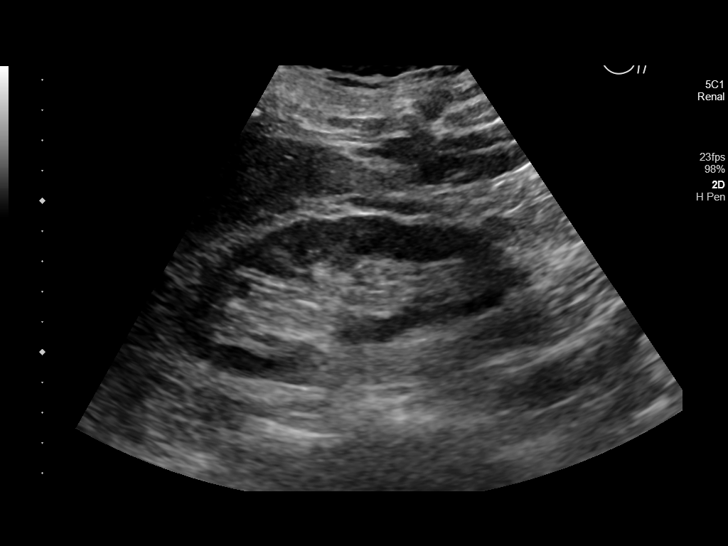
[im 6/22]
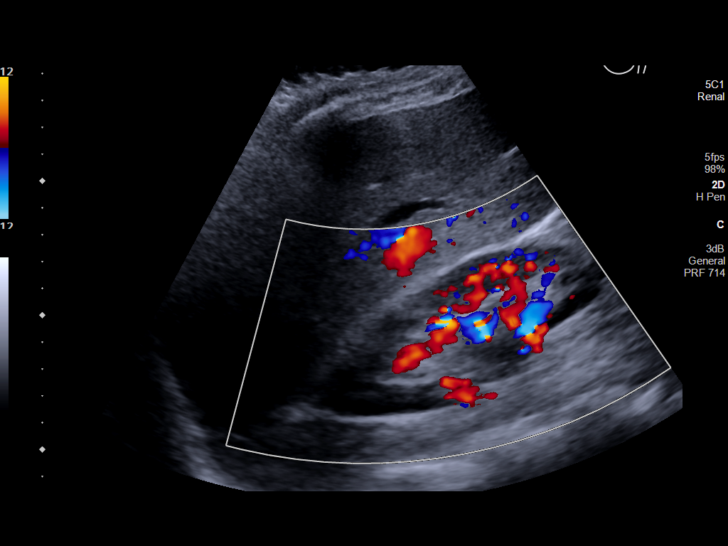
[im 8/22]
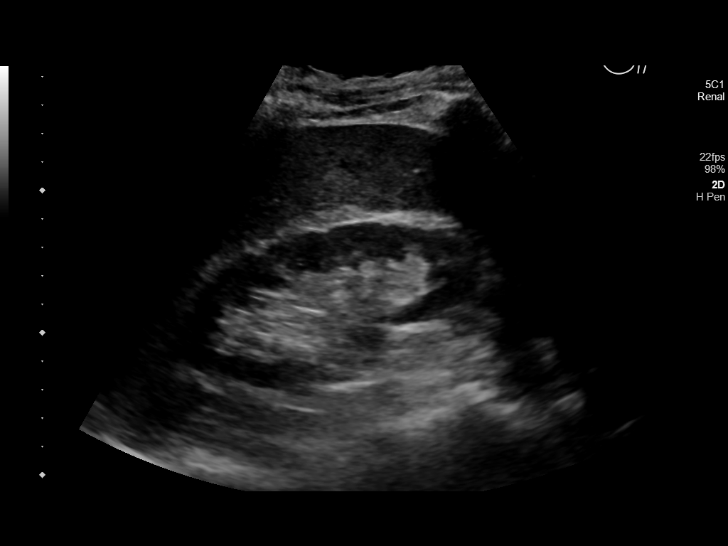
[im 9/22]
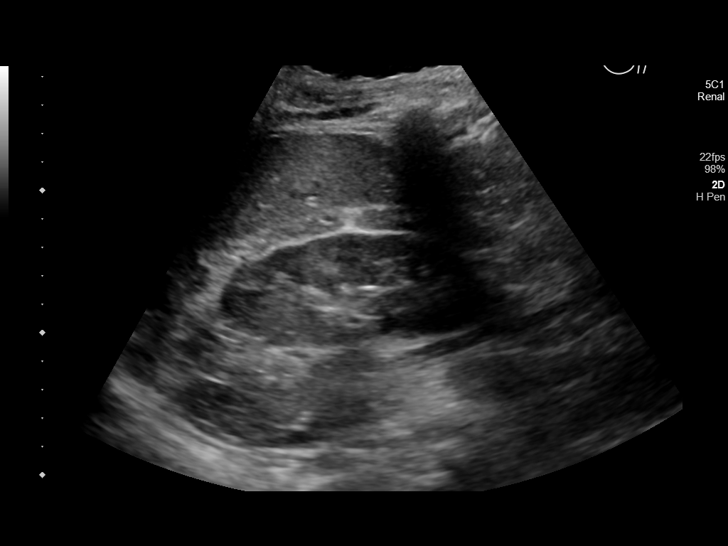
[im 11/22]
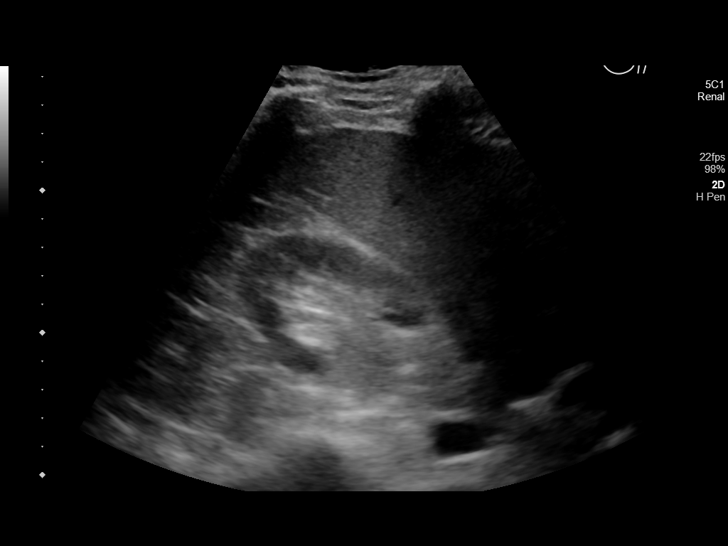
[im 12/22]
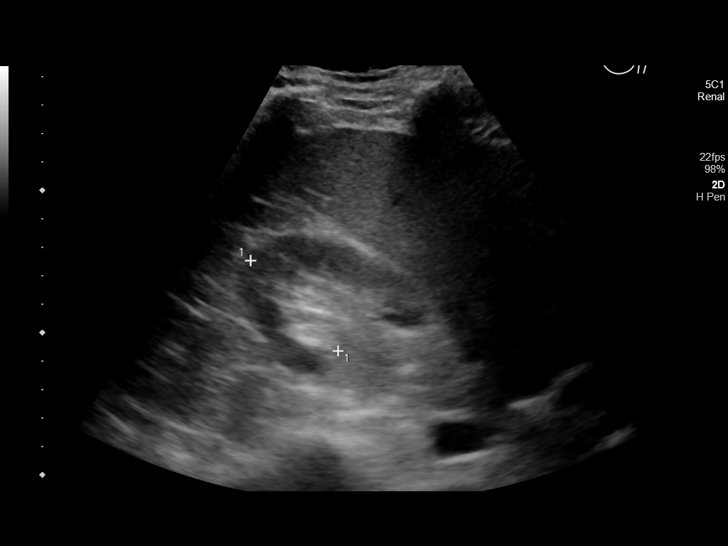
[im 14/22]
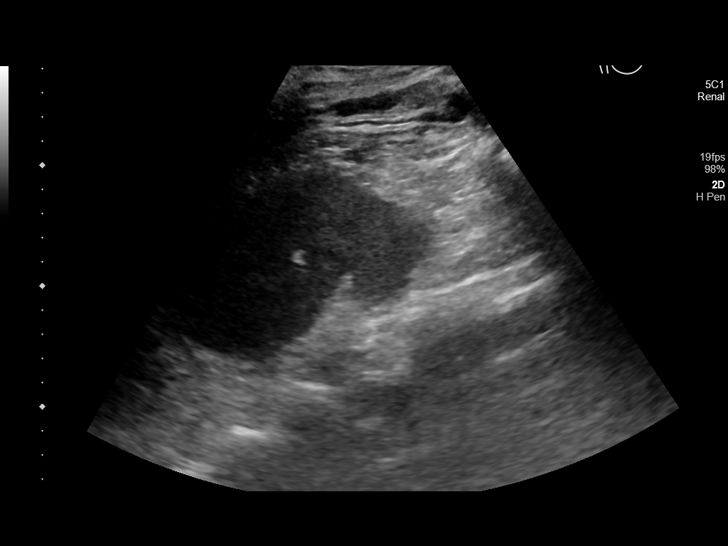
[im 15/22]
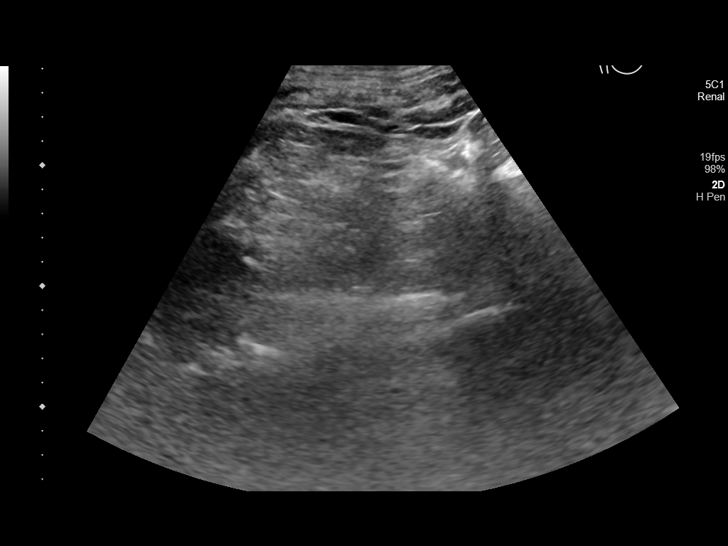
[im 17/22]
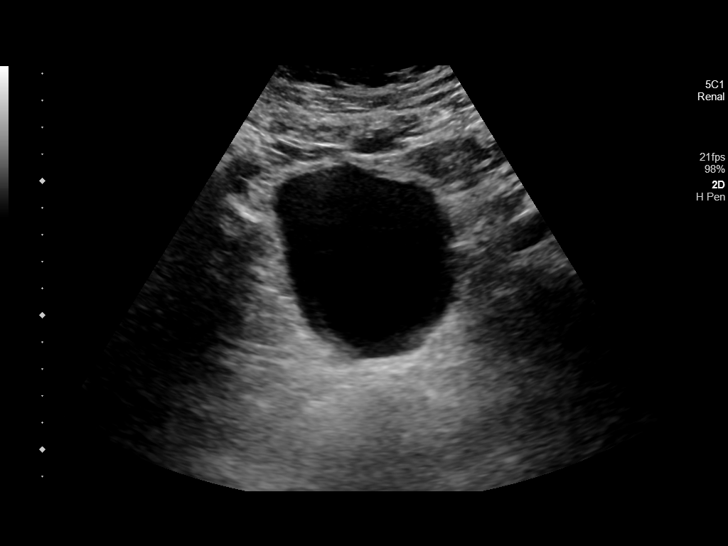
[im 19/22]
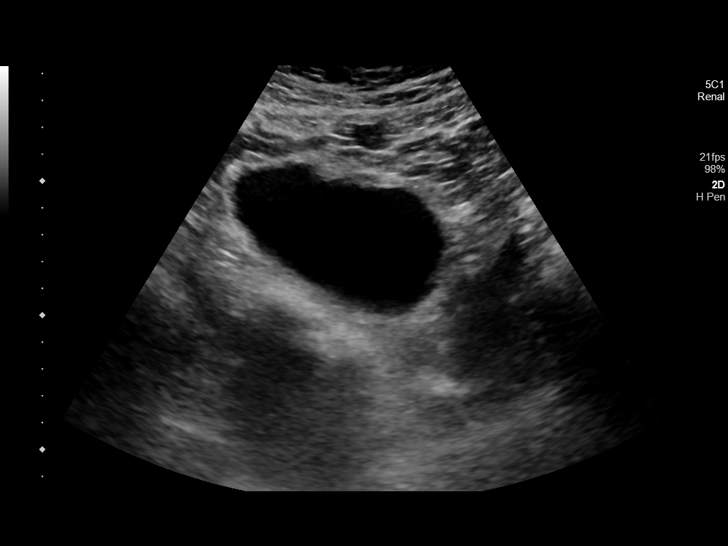
[im 20/22]
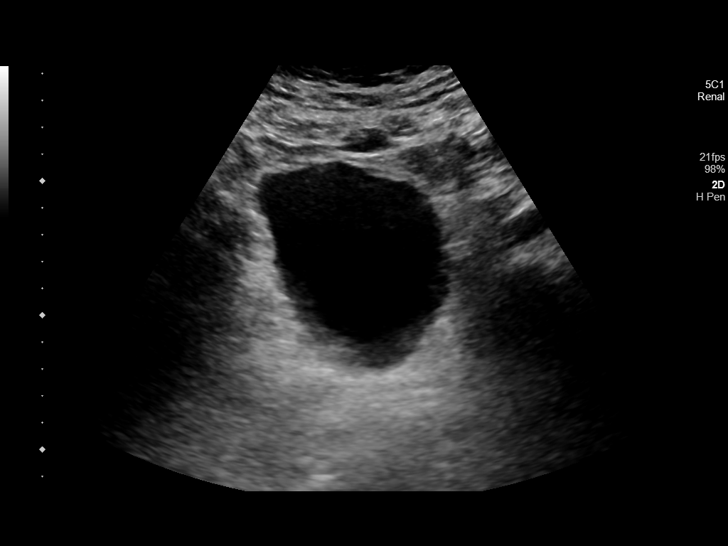
[im 22/22]
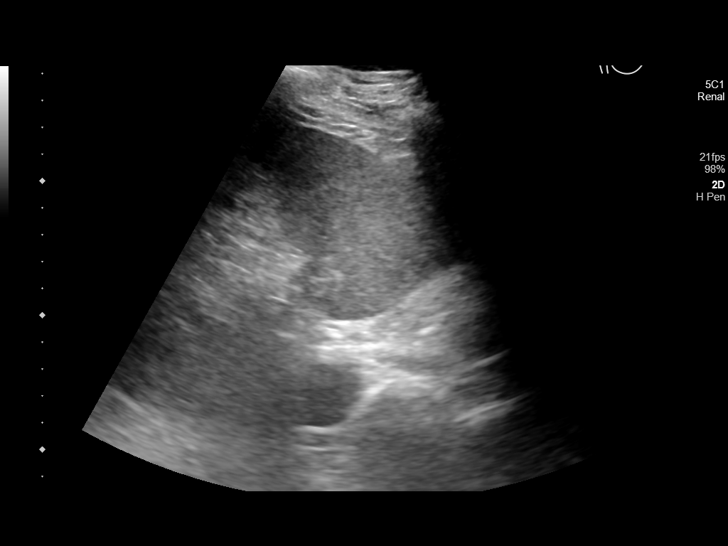

[14 of 22 positions shown; findings below may reference images not displayed]

FINDINGS: Right Kidney:

Renal measurements: 11.5 x 4.4 x 4.4 cm = volume: 118 mL.
Echogenicity within normal limits. No mass or hydronephrosis
visualized.

Left Kidney:

Surgically absent. No mass or fluid collection in the left renal
bed.

Bladder:

Appears normal for degree of bladder distention.

Other:

None.
IMPRESSION: Postop left nephrectomy.  Normal right kidney.

## 2023-08-29 ENCOUNTER — Other Ambulatory Visit: Payer: Self-pay | Admitting: Pulmonary Disease

## 2023-11-29 IMAGING — CT CT CHEST-ABD-PELV W/ CM
2 of 5 series · 13 of 36 positions shown, 15 images · IV contrast (agent unspecified)
Comparison: Chest CT 05/21/2021.  Abdomen/pelvis CT 04/27/2021.

CLINICAL DATA: Left renal cell carcinoma. Status post nephrectomy.
Restaging.

EXAM:
CT CHEST, ABDOMEN, AND PELVIS WITH CONTRAST
TECHNIQUE: Multidetector CT imaging of the chest, abdomen and pelvis was
performed following the standard protocol during bolus
administration of intravenous contrast.

[Series 2: axials cap 5.00 · axial · 0.79mm/px · z∈[-1538,-973]mm · 10 of 139 slices shown, 12 images]
[im 13/139  mediastinal]
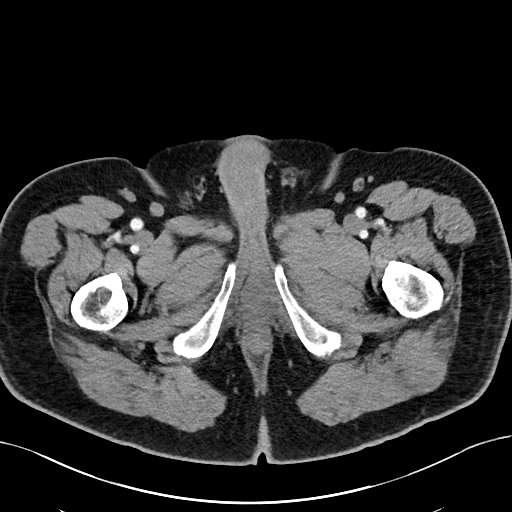
[im 13/139  bone]
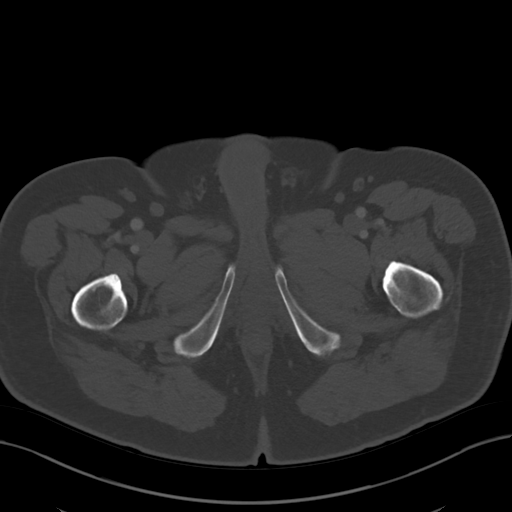
[im 26/139  mediastinal]
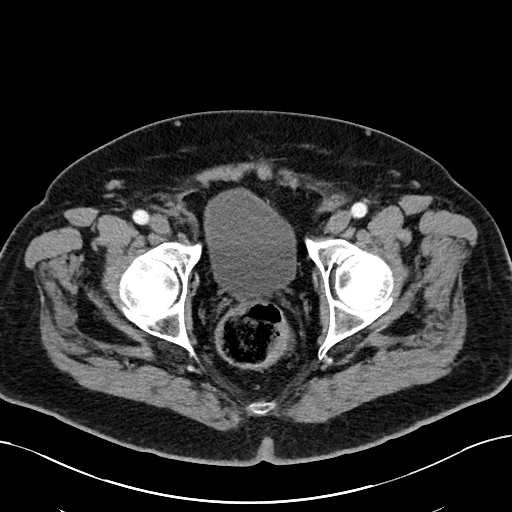
[im 38/139  mediastinal]
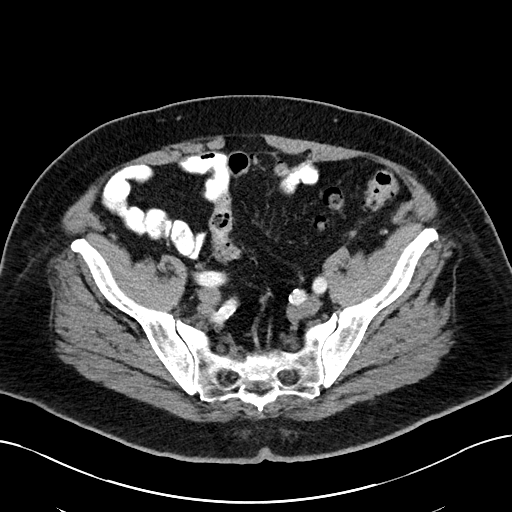
[im 51/139  mediastinal]
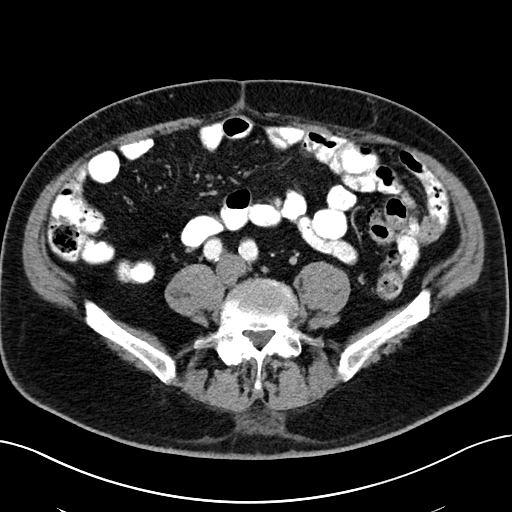
[im 63/139  mediastinal]
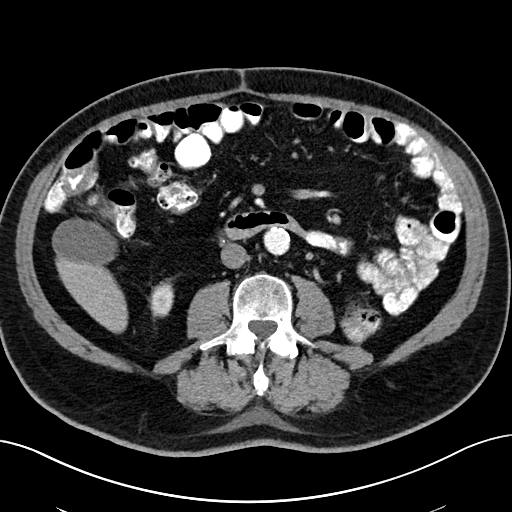
[im 76/139  mediastinal]
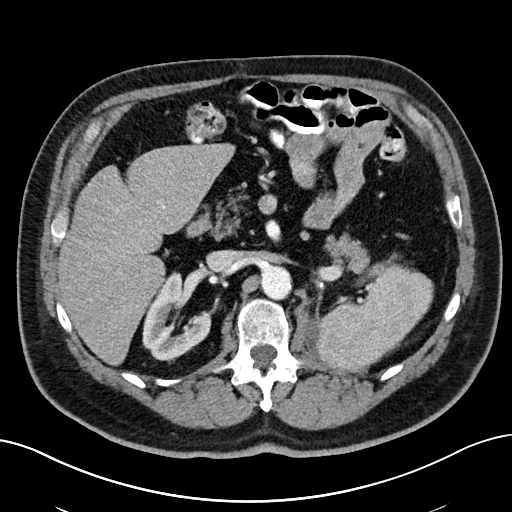
[im 88/139  mediastinal]
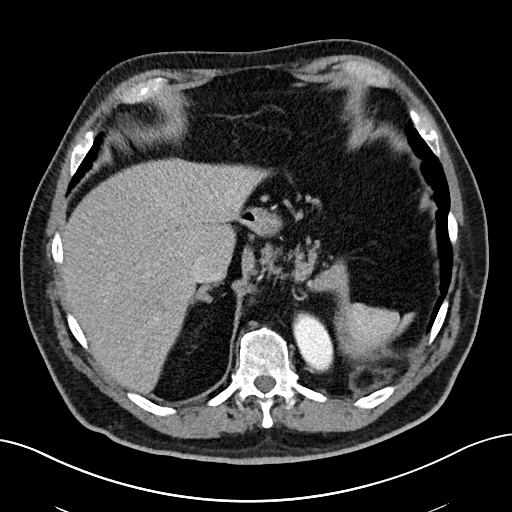
[im 101/139  mediastinal]
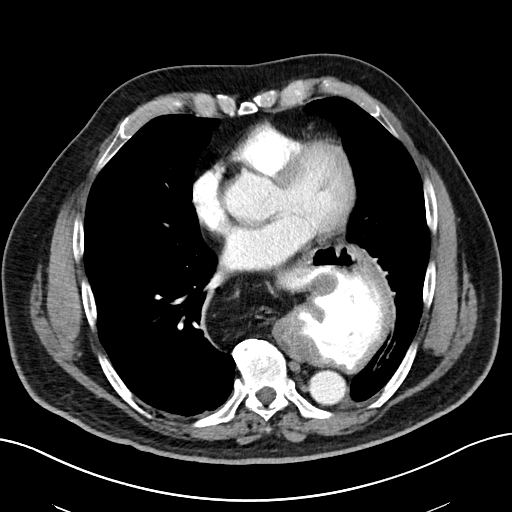
[im 113/139  mediastinal]
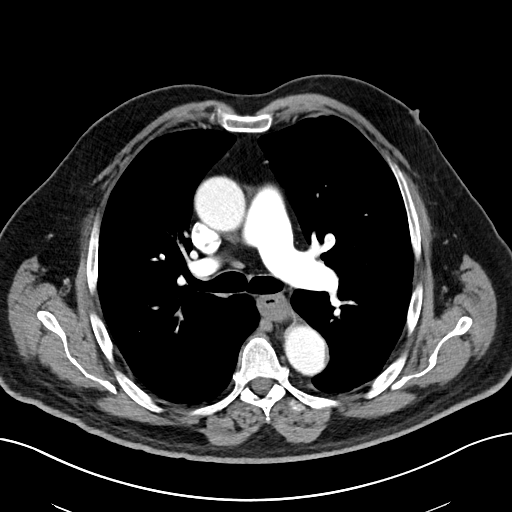
[im 113/139  bone]
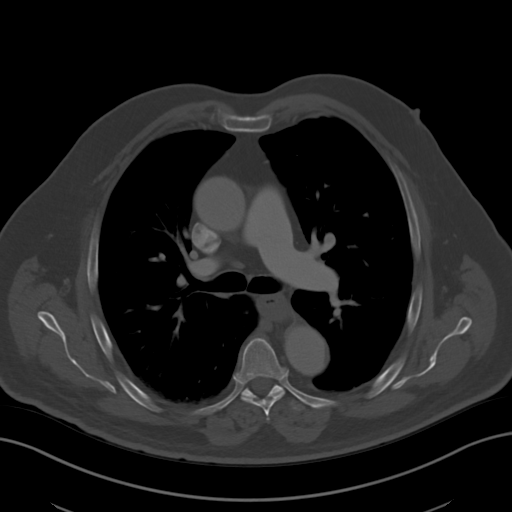
[im 126/139  mediastinal]
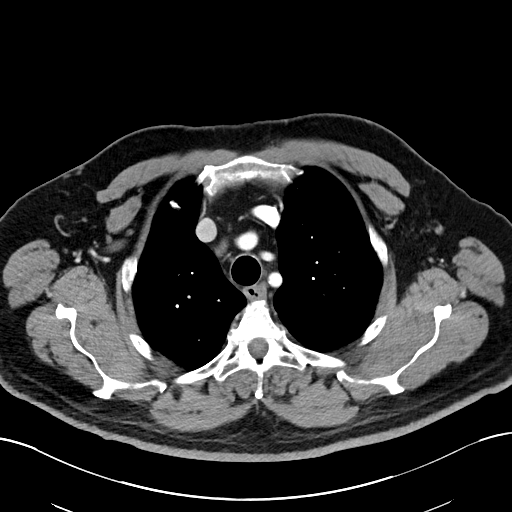

[Series 4: coronals cap 2.00 cor · coronal · 0.79mm/px · 3 of 164 slices shown]
[im 33/164  mediastinal]
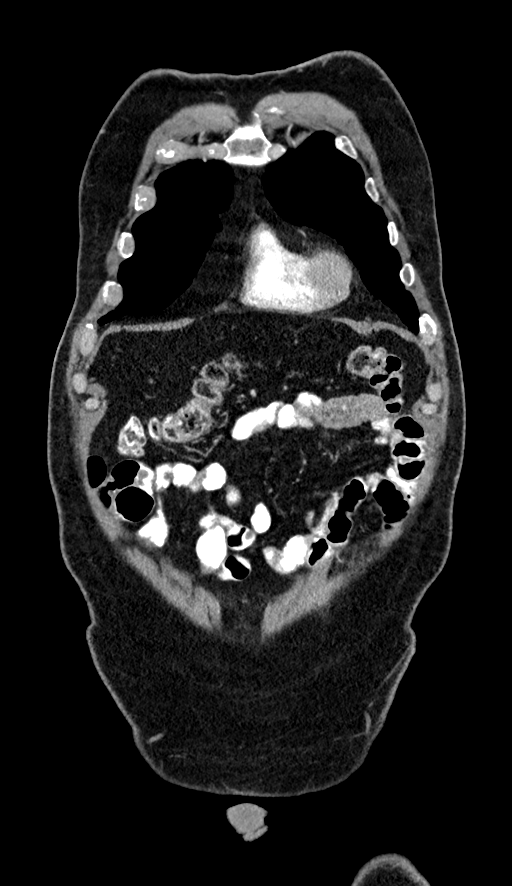
[im 66/164  mediastinal]
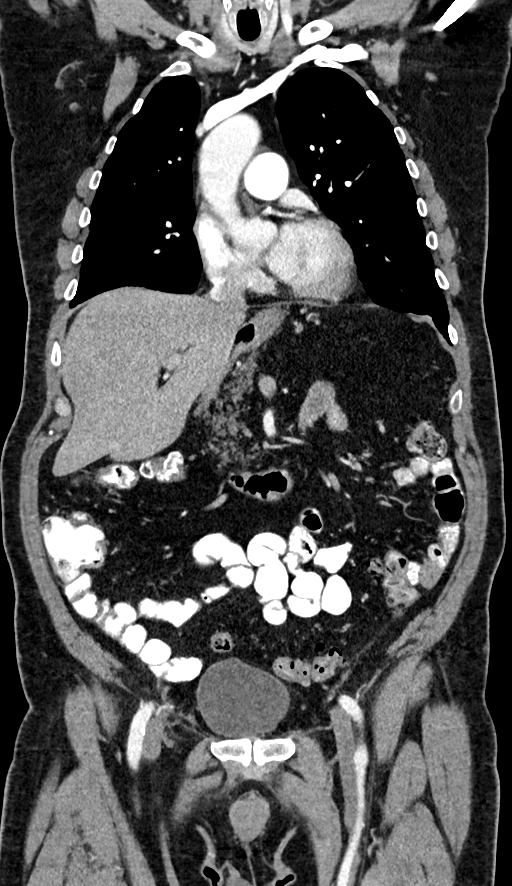
[im 98/164  mediastinal]
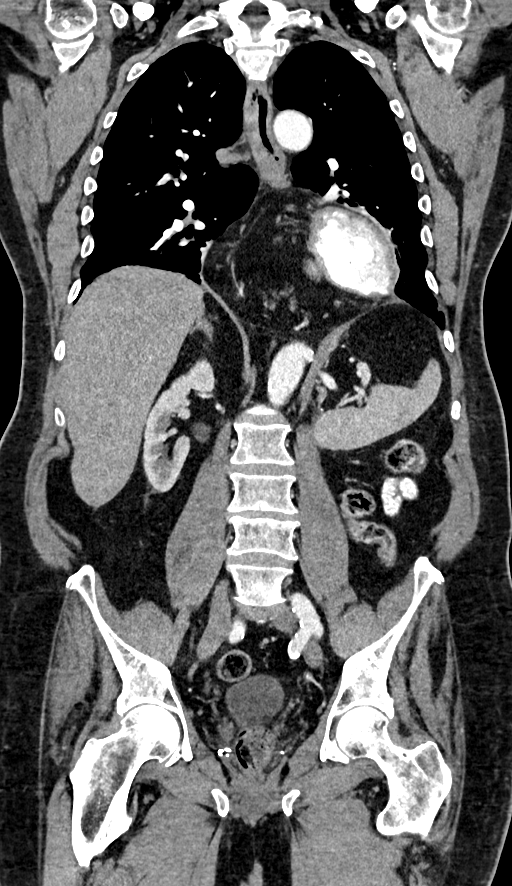

[13 of 36 positions shown; findings below may reference images not displayed]

RADIATION DOSE REDUCTION: This exam was performed according to the
departmental dose-optimization program which includes automated
exposure control, adjustment of the mA and/or kV according to
patient size and/or use of iterative reconstruction technique.

CONTRAST:  80mL OMNIPAQUE IOHEXOL 300 MG/ML  SOLN
FINDINGS: CT CHEST FINDINGS

Cardiovascular: The heart size is normal. No substantial pericardial
effusion. Coronary artery calcification is evident. Ascending
thoracic aorta measures 4.1 cm diameter.

Mediastinum/Nodes: No mediastinal lymphadenopathy. There is no hilar
lymphadenopathy. The esophagus has normal imaging features. Large
hiatal hernia noted. There is no axillary lymphadenopathy.

Lungs/Pleura: 4 mm right upper lobe perifissural nodule seen
anteriorly on 78/3 is stable since prior chest CT, likely subpleural
lymph node. 4 mm subpleural nodule medial left upper lobe on 78/3 is
unchanged. No new suspicious pulmonary nodule or mass. Compressive
atelectasis noted in the dependent lung bases. No pleural effusion.

Musculoskeletal: No worrisome lytic or sclerotic osseous
abnormality.

CT ABDOMEN PELVIS FINDINGS

Hepatobiliary: No suspicious focal abnormality within the liver
parenchyma. There is no evidence for gallstones, gallbladder wall
thickening, or pericholecystic fluid. No intrahepatic or
extrahepatic biliary dilation.

Pancreas: No focal mass lesion. No dilatation of the main duct. No
intraparenchymal cyst. No peripancreatic edema. Portions of the mid
pancreas a retracted cranially into the hiatal hernia.

Spleen: No splenomegaly. No focal mass lesion.

Adrenals/Urinary Tract: No adrenal nodule or mass. Right kidney and
ureter unremarkable.

Left kidney surgically absent 2.3 x 1.2 cm collection of low-density
soft tissues identified posterior to the left adrenal gland, between
the spleen and the diaphragmatic crus. As this represents the
patient's first postoperative study, findings may reflect
postsurgical scarring.

The urinary bladder appears normal for the degree of distention.

Stomach/Bowel: Large hiatal hernia again noted with greater than 75%
of the stomach contained in the chest. Duodenum is normally
positioned as is the ligament of Treitz. No small bowel wall
thickening. No small bowel dilatation. The terminal ileum is normal.
The appendix is not well visualized, but there is no edema or
inflammation in the region of the cecum. No gross colonic mass. No
colonic wall thickening. Diverticular changes are noted in the left
colon without evidence of diverticulitis.

Vascular/Lymphatic: There is mild atherosclerotic calcification of
the abdominal aorta without aneurysm. There is no gastrohepatic or
hepatoduodenal ligament lymphadenopathy. No retroperitoneal or
mesenteric lymphadenopathy. No pelvic sidewall lymphadenopathy.

Reproductive: Status post prostatectomy.

Other: No intraperitoneal free fluid.

Musculoskeletal: No worrisome lytic or sclerotic osseous
abnormality.
IMPRESSION: 1. Interval left nephrectomy. 2.3 x 1.2 cm collection of low-density
soft tissue posterior to the left adrenal gland, between the spleen
and the diaphragmatic crus. As this represents the patient's first
postoperative study, findings are attributed to postsurgical
granulation/scarring. Nevertheless, close attention on follow-up
recommended.
2. Large hiatal hernia with greater than 75% of the stomach
contained in the chest.
3. Left colonic diverticulosis without diverticulitis. The area of
questioned annular thickening in the descending colon seen on the
previous study is not evident today.
4. Aortic Atherosclerosis ([id number]).

## 2023-12-30 ENCOUNTER — Ambulatory Visit: Payer: Medicare Other | Admitting: Urology
